# Patient Record
Sex: Female | Born: 1975 | Race: Black or African American | Hispanic: No | Smoking: Current some day smoker
Health system: Southern US, Community
[De-identification: ages and names within clinical notes are randomized; demographics above are authoritative.]

## PROBLEM LIST (undated history)

## (undated) DIAGNOSIS — K8689 Other specified diseases of pancreas: Secondary | ICD-10-CM

## (undated) DIAGNOSIS — B009 Herpesviral infection, unspecified: Secondary | ICD-10-CM

## (undated) DIAGNOSIS — K219 Gastro-esophageal reflux disease without esophagitis: Secondary | ICD-10-CM

## (undated) DIAGNOSIS — E119 Type 2 diabetes mellitus without complications: Secondary | ICD-10-CM

## (undated) DIAGNOSIS — J45909 Unspecified asthma, uncomplicated: Secondary | ICD-10-CM

## (undated) DIAGNOSIS — D649 Anemia, unspecified: Secondary | ICD-10-CM

## (undated) HISTORY — PX: OTHER SURGICAL HISTORY: SHX169

## (undated) HISTORY — PX: LEEP: SHX91

## (undated) HISTORY — DX: Herpesviral infection, unspecified: B00.9

## (undated) HISTORY — DX: Other specified diseases of pancreas: K86.89

## (undated) HISTORY — PX: CERVICAL BIOPSY  W/ LOOP ELECTRODE EXCISION: SUR135

---

## 2006-03-21 HISTORY — PX: WHIPPLE PROCEDURE: SHX2667

## 2010-03-21 HISTORY — PX: DILATION AND CURETTAGE OF UTERUS: SHX78

## 2013-02-17 DIAGNOSIS — Z794 Long term (current) use of insulin: Secondary | ICD-10-CM | POA: Insufficient documentation

## 2013-02-17 DIAGNOSIS — Z881 Allergy status to other antibiotic agents status: Secondary | ICD-10-CM | POA: Insufficient documentation

## 2013-02-17 DIAGNOSIS — R631 Polydipsia: Secondary | ICD-10-CM | POA: Insufficient documentation

## 2013-02-17 DIAGNOSIS — R630 Anorexia: Secondary | ICD-10-CM | POA: Insufficient documentation

## 2013-02-17 DIAGNOSIS — Z79899 Other long term (current) drug therapy: Secondary | ICD-10-CM | POA: Insufficient documentation

## 2013-02-17 DIAGNOSIS — Z3202 Encounter for pregnancy test, result negative: Secondary | ICD-10-CM | POA: Insufficient documentation

## 2013-02-17 DIAGNOSIS — R632 Polyphagia: Secondary | ICD-10-CM | POA: Insufficient documentation

## 2013-02-17 DIAGNOSIS — E119 Type 2 diabetes mellitus without complications: Secondary | ICD-10-CM | POA: Insufficient documentation

## 2013-02-17 DIAGNOSIS — J45909 Unspecified asthma, uncomplicated: Secondary | ICD-10-CM | POA: Insufficient documentation

## 2013-02-17 DIAGNOSIS — R300 Dysuria: Secondary | ICD-10-CM | POA: Insufficient documentation

## 2013-02-17 DIAGNOSIS — R350 Frequency of micturition: Secondary | ICD-10-CM | POA: Insufficient documentation

## 2013-02-17 DIAGNOSIS — M6281 Muscle weakness (generalized): Secondary | ICD-10-CM | POA: Insufficient documentation

## 2013-02-17 DIAGNOSIS — Z882 Allergy status to sulfonamides status: Secondary | ICD-10-CM | POA: Insufficient documentation

## 2013-02-17 DIAGNOSIS — M62838 Other muscle spasm: Secondary | ICD-10-CM | POA: Insufficient documentation

## 2013-02-17 NOTE — ED Notes (Addendum)
C/o high BS. C/o intermittent blurred vision, loss of appettite, weakness, HA, cramps in legs and feet, frequent urination, yeast infection and wt loss. "feel dehydrated". (denies: vomiting or diarrhea), onset ~ 1 month ago. Does not check her BS. Just moved here. Waiting for insurance to start. Won't be able to go to the MD until January. Mentions subjective fever b/c she was sweating really bad and mentions constipation.

## 2013-02-18 ENCOUNTER — Emergency Department (HOSPITAL_COMMUNITY)
Admission: EM | Admit: 2013-02-18 | Discharge: 2013-02-18 | Disposition: A | Discharge status: Home / Self Care | Payer: BC Managed Care – PPO | Attending: Emergency Medicine | Admitting: Emergency Medicine

## 2013-02-18 ENCOUNTER — Encounter (HOSPITAL_COMMUNITY): Payer: Self-pay | Admitting: Emergency Medicine

## 2013-02-18 DIAGNOSIS — R739 Hyperglycemia, unspecified: Secondary | ICD-10-CM

## 2013-02-18 DIAGNOSIS — E119 Type 2 diabetes mellitus without complications: Secondary | ICD-10-CM

## 2013-02-18 HISTORY — DX: Type 2 diabetes mellitus without complications: E11.9

## 2013-02-18 HISTORY — DX: Unspecified asthma, uncomplicated: J45.909

## 2013-02-18 LAB — CBC WITH DIFFERENTIAL/PLATELET
Basophils Relative: 0 % (ref 0–1)
Eosinophils Absolute: 0.1 10*3/uL (ref 0.0–0.7)
Hemoglobin: 14.9 g/dL (ref 12.0–15.0)
MCH: 28.1 pg (ref 26.0–34.0)
MCHC: 35.1 g/dL (ref 30.0–36.0)
MCV: 80 fL (ref 78.0–100.0)
Monocytes Relative: 5 % (ref 3–12)
Neutrophils Relative %: 69 % (ref 43–77)
Platelets: 208 10*3/uL (ref 150–400)

## 2013-02-18 LAB — POCT I-STAT, CHEM 8
BUN: 14 mg/dL (ref 6–23)
Calcium, Ion: 1.23 mmol/L (ref 1.12–1.23)
Chloride: 98 mEq/L (ref 96–112)
Potassium: 3.9 mEq/L (ref 3.5–5.1)
TCO2: 18 mmol/L (ref 0–100)

## 2013-02-18 LAB — GLUCOSE, CAPILLARY
Glucose-Capillary: 405 mg/dL — ABNORMAL HIGH (ref 70–99)
Glucose-Capillary: 199 mg/dL — ABNORMAL HIGH (ref 70–99)
Glucose-Capillary: 224 mg/dL — ABNORMAL HIGH (ref 70–99)

## 2013-02-18 LAB — POCT I-STAT 3, VENOUS BLOOD GAS (G3P V)
O2 Saturation: 91 %
pCO2, Ven: 31.7 mmHg — ABNORMAL LOW (ref 45.0–50.0)
pH, Ven: 7.315 — ABNORMAL HIGH (ref 7.250–7.300)
pO2, Ven: 66 mmHg — ABNORMAL HIGH (ref 30.0–45.0)

## 2013-02-18 LAB — URINE MICROSCOPIC-ADD ON

## 2013-02-18 LAB — URINALYSIS, ROUTINE W REFLEX MICROSCOPIC
Hgb urine dipstick: NEGATIVE
Protein, ur: NEGATIVE mg/dL
Specific Gravity, Urine: 1.039 — ABNORMAL HIGH (ref 1.005–1.030)
Urobilinogen, UA: 0.2 mg/dL (ref 0.0–1.0)

## 2013-02-18 LAB — POCT PREGNANCY, URINE: Preg Test, Ur: NEGATIVE

## 2013-02-18 MED ORDER — INSULIN ASPART 100 UNIT/ML ~~LOC~~ SOLN
10.0000 [IU] | Freq: Once | SUBCUTANEOUS | Status: AC
  Administered 2013-02-18: 10 [IU] via INTRAVENOUS
  Filled 2013-02-18: qty 1

## 2013-02-18 MED ORDER — SODIUM CHLORIDE 0.9 % IV BOLUS (SEPSIS)
1000.0000 mL | Freq: Once | INTRAVENOUS | Status: AC
  Administered 2013-02-18: 1000 mL via INTRAVENOUS

## 2013-02-18 MED ORDER — METFORMIN HCL 500 MG PO TABS: 500.0000 mg | ORAL_TABLET | Freq: Two times a day (BID) | ORAL | Status: DC

## 2013-02-18 MED ORDER — INSULIN ASPART 100 UNIT/ML ~~LOC~~ SOLN: 9.0000 [IU] | Freq: Three times a day (TID) | SUBCUTANEOUS | Status: DC

## 2013-02-18 NOTE — ED Notes (Signed)
CBG 199 

## 2013-02-18 NOTE — ED Notes (Signed)
IV team at bedside 

## 2013-02-18 NOTE — ED Provider Notes (Signed)
CSN: [id number]     Arrival date & time 02/17/13  2339 History   First MD Initiated Contact with Patient 02/18/13 0106     Chief Complaint  Patient presents with  . Hyperglycemia   HPI  History provided by the patient. Patient is a 37 year old female with past history is of diabetes and asthma who recently moved to the [place] area in [place] and presents with concerns for elevated blood sugar and lack of her normal insulin medicine. Patient states that she has been without her small insulin for the past week. She has been waiting for her new medical insurance to start in January and has not followed up with a primary care provider. She states she does have some insulin remaining but ran out of her syringe is. Since then she has had some feeling of dehydration with occasional cramping in the feet, gradual general weakness and decreasing appetite. She also complains of polyuria and polydipsia slight dysuria. She denies any hematuria.    Past Medical History  Diagnosis Date  . Diabetes mellitus without complication   . Asthma    Past Surgical History  Procedure Laterality Date  . Cervical biopsy  w/ loop electrode excision     No family history on file. History  Substance Use Topics  . Smoking status: Never Smoker   . Smokeless tobacco: Not on file  . Alcohol Use: Yes     Comment: occaisionally   OB History   Grav Para Term Preterm Abortions TAB SAB Ect Mult Living                 Review of Systems  Constitutional: Negative for fever, chills and diaphoresis.  Respiratory: Negative for shortness of breath.   Cardiovascular: Negative for chest pain.  Gastrointestinal: Negative for nausea, vomiting, abdominal pain, diarrhea and constipation.  Endocrine: Positive for polyphagia and polyuria.  Genitourinary: Positive for dysuria. Negative for hematuria, flank pain, vaginal bleeding and vaginal discharge.  All other systems reviewed and are negative.    Allergies   Amoxicillin and Sulfa antibiotics  Home Medications  No current outpatient prescriptions on file. BP 124/85  Pulse 90  Temp(Src) 98.8 F (37.1 C) (Oral)  Resp 18  SpO2 99%  LMP 01/26/2013 Physical Exam  Nursing note and vitals reviewed. Constitutional: She is oriented to person, place, and time. She appears well-developed and well-nourished. No distress.  HENT:  Head: Normocephalic and atraumatic.  Eyes: Conjunctivae and EOM are normal. Pupils are equal, round, and reactive to light.  Cardiovascular: Normal rate and regular rhythm.   Pulmonary/Chest: Effort normal and breath sounds normal. No respiratory distress. She has no wheezes. She has no rales.  Abdominal: Soft. There is no tenderness. There is no rebound and no guarding.  No CVA tenderness  Neurological: She is alert and oriented to person, place, and time.  Skin: Skin is warm and dry. No rash noted.  Psychiatric: She has a normal mood and affect. Her behavior is normal.    ED Course  Procedures   DIAGNOSTIC STUDIES: Oxygen Saturation is 99% on room air.    COORDINATION OF CARE:  Nursing notes reviewed. Vital signs reviewed. Initial pt interview and examination performed.   1:20 PM patient seen and evaluated. Patient well-appearing in no acute distress. No clinical signs concerning for DKA. No recent complaints of illness or infection. Patient has been unable to use her normal insulin doses. Discussed work up plan with pt at bedside, which includes lab testing and UA. Pt  agrees with plan.  No signs for DKA. Blood sugar has improved significantly. Patient is feeling slightly better with IV fluids. At this time we'll give prescriptions for her insulin was and metformin. Patient instructed to followup with PCP for continued evaluation and treatment.  Treatment plan initiated: Medications  sodium chloride 0.9 % bolus 1,000 mL (not administered)  insulin aspart (novoLOG) injection 10 Units (not administered)     Results for orders placed during the hospital encounter of 02/18/13  CBC WITH DIFFERENTIAL      Result Value Range   WBC 12.0 (*) 4.0 - 10.5 K/uL   RBC 5.30 (*) 3.87 - 5.11 MIL/uL   Hemoglobin 14.9  12.0 - 15.0 g/dL   HCT 47.8  29.5 - 62.1 %   MCV 80.0  78.0 - 100.0 fL   MCH 28.1  26.0 - 34.0 pg   MCHC 35.1  30.0 - 36.0 g/dL   RDW 30.8  65.7 - 84.6 %   Platelets 208  150 - 400 K/uL   Neutrophils Relative % 69  43 - 77 %   Neutro Abs 8.3 (*) 1.7 - 7.7 K/uL   Lymphocytes Relative 25  12 - 46 %   Lymphs Abs 3.0  0.7 - 4.0 K/uL   Monocytes Relative 5  3 - 12 %   Monocytes Absolute 0.6  0.1 - 1.0 K/uL   Eosinophils Relative 1  0 - 5 %   Eosinophils Absolute 0.1  0.0 - 0.7 K/uL   Basophils Relative 0  0 - 1 %   Basophils Absolute 0.0  0.0 - 0.1 K/uL  URINALYSIS, ROUTINE W REFLEX MICROSCOPIC      Result Value Range   Color, Urine YELLOW  YELLOW   APPearance CLEAR  CLEAR   Specific Gravity, Urine 1.039 (*) 1.005 - 1.030   pH 6.0  5.0 - 8.0   Glucose, UA >1000 (*) NEGATIVE mg/dL   Hgb urine dipstick NEGATIVE  NEGATIVE   Bilirubin Urine NEGATIVE  NEGATIVE   Ketones, ur >80 (*) NEGATIVE mg/dL   Protein, ur NEGATIVE  NEGATIVE mg/dL   Urobilinogen, UA 0.2  0.0 - 1.0 mg/dL   Nitrite NEGATIVE  NEGATIVE   Leukocytes, UA NEGATIVE  NEGATIVE  URINE MICROSCOPIC-ADD ON      Result Value Range   Squamous Epithelial / LPF RARE  RARE   WBC, UA 0-2  <3 WBC/hpf   RBC / HPF 0-2  <3 RBC/hpf   Urine-Other RARE YEAST    GLUCOSE, CAPILLARY      Result Value Range   Glucose-Capillary 405 (*) 70 - 99 mg/dL  GLUCOSE, CAPILLARY      Result Value Range   Glucose-Capillary 199 (*) 70 - 99 mg/dL  GLUCOSE, CAPILLARY      Result Value Range   Glucose-Capillary 224 (*) 70 - 99 mg/dL  POCT PREGNANCY, URINE      Result Value Range   Preg Test, Ur NEGATIVE  NEGATIVE  POCT I-STAT, CHEM 8      Result Value Range   Sodium 132 (*) 135 - 145 mEq/L   Potassium 3.9  3.5 - 5.1 mEq/L   Chloride 98  96 -  112 mEq/L   BUN 14  6 - 23 mg/dL   Creatinine, Ser 9.62  0.50 - 1.10 mg/dL   Glucose, Bld 952 (*) 70 - 99 mg/dL   Calcium, Ion 8.41  3.24 - 1.23 mmol/L   TCO2 18  0 - 100 mmol/L   Hemoglobin  16.7 (*) 12.0 - 15.0 g/dL   HCT 16.1 (*) 09.6 - 04.5 %  POCT I-STAT 3, BLOOD GAS (G3P V)      Result Value Range   pH, Ven 7.315 (*) 7.250 - 7.300   pCO2, Ven 31.7 (*) 45.0 - 50.0 mmHg   pO2, Ven 66.0 (*) 30.0 - 45.0 mmHg   Bicarbonate 16.1 (*) 20.0 - 24.0 mEq/L   TCO2 17  0 - 100 mmol/L   O2 Saturation 91.0     Acid-base deficit 9.0 (*) 0.0 - 2.0 mmol/L   Patient temperature 98.8 F     Sample type VENOUS         MDM   1. Hyperglycemia   2. Diabetes        Angus Seller, PA-C 02/18/13 [phone]

## 2013-02-18 NOTE — ED Notes (Signed)
CBG taken =405

## 2013-02-18 NOTE — ED Notes (Signed)
Pt reports she recently moved to [place] from [place], pt states she was dx with diabetes in 2008, pt states she usually manages her blood glucose well. However the pt has not taken her prescribed insulin in over a month as she lost her insurance and dose not have a MD here in [place]. Pt states she feels hot and cold, frequent urination, and that she feels foggy.

## 2013-02-18 NOTE — ED Provider Notes (Signed)
Medical screening examination/treatment/procedure(s) were performed by non-physician practitioner and as supervising physician I was immediately available for consultation/collaboration.    Clemma Johnsen M Kristapher [place], MD 02/18/13 0634 

## 2013-02-18 NOTE — ED Notes (Signed)
IV team called and paged. 3 IV attempts by 2 RNs.

## 2013-03-15 ENCOUNTER — Emergency Department (HOSPITAL_COMMUNITY)
Admission: EM | Admit: 2013-03-15 | Discharge: 2013-03-15 | Disposition: A | Discharge status: Home / Self Care | Payer: BC Managed Care – PPO | Source: Home / Self Care | Attending: Family Medicine | Admitting: Family Medicine

## 2013-03-15 ENCOUNTER — Encounter (HOSPITAL_COMMUNITY): Payer: Self-pay | Admitting: Emergency Medicine

## 2013-03-15 DIAGNOSIS — J040 Acute laryngitis: Secondary | ICD-10-CM

## 2013-03-15 MED ORDER — METHYLPREDNISOLONE SODIUM SUCC 125 MG IJ SOLR
125.0000 mg | Freq: Once | INTRAMUSCULAR | Status: AC
  Administered 2013-03-15: 125 mg via INTRAMUSCULAR

## 2013-03-15 MED ORDER — METHYLPREDNISOLONE SODIUM SUCC 125 MG IJ SOLR
INTRAMUSCULAR | Status: AC
  Filled 2013-03-15: qty 2

## 2013-03-15 MED ORDER — METHYLPREDNISOLONE 4 MG PO KIT: PACK | ORAL | Status: DC

## 2013-03-15 NOTE — ED Notes (Signed)
Patient not ready for discharge-medication ordered

## 2013-03-15 NOTE — ED Notes (Signed)
No voice, cough, sinus drainage.  Denies sore throat, denies fever.  Onset of symptoms 2 weeks ago

## 2013-03-15 NOTE — ED Notes (Signed)
Bed: UC07 Expected date:  Expected time:  Means of arrival:  Comments: NO PROVIDER

## 2013-03-15 NOTE — ED Notes (Signed)
Patient denies pain and is resting comfortably.  

## 2013-03-15 NOTE — ED Provider Notes (Signed)
CSN: [id number]     Arrival date & time 03/15/13  4098 History   First MD Initiated Contact with Patient 03/15/13 [phone]     No chief complaint on file.  (Consider location/radiation/quality/duration/timing/severity/associated sxs/prior Treatment) Patient is a 37 y.o. female presenting with URI. The history is provided by the patient.  URI Presenting symptoms: congestion   Presenting symptoms: no fever and no rhinorrhea   Presenting symptoms comment:  Laryngitis  Severity:  Mild Onset quality:  Gradual Duration:  2 weeks Progression:  Unchanged Chronicity:  New Relieved by:  None tried Worsened by:  Nothing tried   Past Medical History  Diagnosis Date  . Diabetes mellitus without complication   . Asthma    Past Surgical History  Procedure Laterality Date  . Cervical biopsy  w/ loop electrode excision     No family history on file. History  Substance Use Topics  . Smoking status: Never Smoker   . Smokeless tobacco: Not on file  . Alcohol Use: Yes     Comment: occaisionally   OB History   Grav Para Term Preterm Abortions TAB SAB Ect Mult Living                 Review of Systems  Constitutional: Negative.  Negative for fever.  HENT: Positive for congestion and voice change. Negative for postnasal drip and rhinorrhea.   Respiratory: Negative.     Allergies  Amoxicillin and Sulfa antibiotics  Home Medications   Current Outpatient Rx  Name  Route  Sig  Dispense  Refill  . insulin aspart (NOVOLOG) 100 UNIT/ML injection   Subcutaneous   Inject 9 Units into the skin 3 (three) times daily before meals.   1 vial   12   . metFORMIN (GLUCOPHAGE) 500 MG tablet   Oral   Take 1 tablet (500 mg total) by mouth 2 (two) times daily.   30 tablet   0   . methylPREDNISolone (MEDROL DOSEPAK) 4 MG tablet      follow package directions, start on sat, take until finished   21 tablet   0   . Prenatal Vit-Fe Fumarate-FA (PRENATAL MULTIVITAMIN) TABS tablet   Oral   Take 1  tablet by mouth daily at 12 noon.         . vitamin C (ASCORBIC ACID) 500 MG tablet   Oral   Take 500 mg by mouth 2 (two) times daily.          BP 105/75  Pulse 72  Temp(Src) 97.5 F (36.4 C) (Oral)  Resp 20  SpO2 100%  LMP 02/24/2013 Physical Exam  Nursing note and vitals reviewed. Constitutional: She is oriented to person, place, and time. She appears well-developed and well-nourished.  HENT:  Head: Normocephalic.  Right Ear: External ear normal.  Left Ear: External ear normal.  Mouth/Throat: Oropharynx is clear and moist.  laryngitis  Eyes: Conjunctivae are normal.  Neck: Normal range of motion. Neck supple.  Pulmonary/Chest: Breath sounds normal.  Lymphadenopathy:    She has no cervical adenopathy.  Neurological: She is alert and oriented to person, place, and time.  Skin: Skin is warm and dry.    ED Course  Procedures (including critical care time) Labs Review Labs Reviewed - No data to display Imaging Review No results found.  EKG Interpretation    Date/Time:    Ventricular Rate:    PR Interval:    QRS Duration:   QT Interval:    QTC Calculation:   R  Axis:     Text Interpretation:              MDM   1. Laryngitis, acute      Linna Hoff, MD 03/15/13 [phone]

## 2013-03-18 ENCOUNTER — Emergency Department (INDEPENDENT_AMBULATORY_CARE_PROVIDER_SITE_OTHER): Payer: BC Managed Care – PPO

## 2013-03-18 ENCOUNTER — Emergency Department (HOSPITAL_COMMUNITY)
Admission: EM | Admit: 2013-03-18 | Discharge: 2013-03-18 | Disposition: A | Discharge status: Home / Self Care | Payer: BC Managed Care – PPO | Source: Home / Self Care | Attending: Emergency Medicine | Admitting: Emergency Medicine

## 2013-03-18 ENCOUNTER — Encounter (HOSPITAL_COMMUNITY): Payer: Self-pay | Admitting: Emergency Medicine

## 2013-03-18 DIAGNOSIS — J040 Acute laryngitis: Secondary | ICD-10-CM

## 2013-03-18 LAB — POCT RAPID STREP A: Streptococcus, Group A Screen (Direct): NEGATIVE

## 2013-03-18 NOTE — ED Notes (Signed)
Follow up on laryngitis States she doesn't feel any better

## 2013-03-18 NOTE — ED Provider Notes (Signed)
CSN: [id number]     Arrival date & time 03/18/13  1429 History   First MD Initiated Contact with Patient 03/18/13 1822     Chief Complaint  Patient presents with  . Laryngitis   (Consider location/radiation/quality/duration/timing/severity/associated sxs/prior Treatment) HPI Comments: 37 year old female presents complaining of laryngitis. This originally began 3 weeks ago with laryngitis is the only symptom, starting suddenly and getting worse over days. She also started to develop a cough around this time, after laryngitis. The cough has persisted and remains nonproductive. Additionally, she admits to unintended weight loss in the past 3 weeks, not measured but feeling her clothes loosen, as well as night sweats in the past 2 weeks. She was previously seen here for this problem a few days ago. She was given a shot of Solu-Medrol and a Medrol Dosepak at that time. In the 3 days since that time, she says this has gotten progressively worse and she started to have a slight sore throat. She does not smoke. No shortness of breath or hemoptysis. No recent travel   Past Medical History  Diagnosis Date  . Diabetes mellitus without complication   . Asthma    Past Surgical History  Procedure Laterality Date  . Cervical biopsy  w/ loop electrode excision     History reviewed. No pertinent family history. History  Substance Use Topics  . Smoking status: Never Smoker   . Smokeless tobacco: Not on file  . Alcohol Use: Yes     Comment: occaisionally   OB History   Grav Para Term Preterm Abortions TAB SAB Ect Mult Living                 Review of Systems  Constitutional: Positive for diaphoresis (at night only), appetite change and unexpected weight change. Negative for fever and chills.  HENT: Positive for sore throat and voice change.   Eyes: Negative for visual disturbance.  Respiratory: Positive for cough. Negative for shortness of breath and wheezing.   Cardiovascular: Negative for chest  pain, palpitations and leg swelling.  Gastrointestinal: Negative for nausea, vomiting and abdominal pain.  Endocrine: Negative for polydipsia and polyuria.  Genitourinary: Negative for dysuria, urgency and frequency.  Musculoskeletal: Negative for arthralgias and myalgias.  Skin: Negative for rash.  Neurological: Negative for dizziness, weakness and light-headedness.    Allergies  Amoxicillin and Sulfa antibiotics  Home Medications   Current Outpatient Rx  Name  Route  Sig  Dispense  Refill  . insulin aspart (NOVOLOG) 100 UNIT/ML injection   Subcutaneous   Inject 9 Units into the skin 3 (three) times daily before meals.   1 vial   12   . metFORMIN (GLUCOPHAGE) 500 MG tablet   Oral   Take 1 tablet (500 mg total) by mouth 2 (two) times daily.   30 tablet   0   . methylPREDNISolone (MEDROL DOSEPAK) 4 MG tablet      follow package directions, start on sat, take until finished   21 tablet   0   . Prenatal Vit-Fe Fumarate-FA (PRENATAL MULTIVITAMIN) TABS tablet   Oral   Take 1 tablet by mouth daily at 12 noon.         . vitamin C (ASCORBIC ACID) 500 MG tablet   Oral   Take 500 mg by mouth 2 (two) times daily.          BP 125/76  Pulse 70  Temp(Src) 98.1 F (36.7 C) (Oral)  Resp 14  SpO2 99%  LMP 02/24/2013  Physical Exam  Nursing note and vitals reviewed. Constitutional: She is oriented to person, place, and time. Vital signs are normal. She appears well-developed and well-nourished. No distress.  laryngitis  HENT:  Head: Normocephalic and atraumatic.  Nose: Nose normal.  Mouth/Throat: Uvula is midline, oropharynx is clear and moist and mucous membranes are normal. No oropharyngeal exudate.  Neck: Normal range of motion. Neck supple. No JVD present. No tracheal deviation present.  Cardiovascular: Normal rate, regular rhythm and normal heart sounds.  Exam reveals no gallop and no friction rub.   No murmur heard. Pulmonary/Chest: Effort normal and breath sounds  normal. No respiratory distress. She has no wheezes. She has no rales.  Lymphadenopathy:    She has no cervical adenopathy.       Right: No supraclavicular adenopathy present.       Left: No supraclavicular adenopathy present.  Neurological: She is alert and oriented to person, place, and time. She has normal strength. Coordination normal.  Skin: Skin is warm and dry. No rash noted. She is not diaphoretic.  Psychiatric: She has a normal mood and affect. Judgment normal.    ED Course  Procedures (including critical care time) Labs Review Labs Reviewed  POCT RAPID STREP A ([facility] CARE ONLY)   Imaging Review Dg Chest 2 View  03/18/2013   CLINICAL DATA:  Cough, laryngitis.  EXAM: CHEST  2 VIEW  COMPARISON:  None.  FINDINGS: The heart size and mediastinal contours are within normal limits. There is no focal infiltrate, pulmonary edema, or pleural effusion. There is scoliosis of spine.  IMPRESSION: No active cardiopulmonary disease.   Electronically Signed   By: Sherian Rein M.D.   On: 03/18/2013 18:55      MDM   1. Laryngitis    CXR negative for any masses.  Discussed with patient that the prolonged laryngitis along with the night sweats and weight loss is concerning for possible malignancy, she will need to see otolaryngology as soon as possible. She has been referred to Dr. Annalee Genta, she will call tomorrow morning for an appointment.   Graylon Good, PA-C 03/18/13 1922

## 2013-03-19 NOTE — ED Provider Notes (Signed)
Medical screening examination/treatment/procedure(s) were performed by non-physician practitioner and as supervising physician I was immediately available for consultation/collaboration.  Kashmere Daywalt, M.D.  Vicenta Olds C Mystery Schrupp, MD 03/19/13 0022 

## 2013-03-20 LAB — CULTURE, GROUP A STREP

## 2013-03-21 DIAGNOSIS — K8689 Other specified diseases of pancreas: Secondary | ICD-10-CM

## 2013-03-21 HISTORY — DX: Other specified diseases of pancreas: K86.89

## 2013-04-08 NOTE — Progress Notes (Signed)
ED CM received incoming call from patient regarding needing a prior authorization for a prescription that she claims she got here 02/18/13. Pt was informed that she would need to see PCP to get them to write her a prescription. No further ED CM needs identified.

## 2013-04-11 ENCOUNTER — Ambulatory Visit: Payer: BC Managed Care – PPO | Admitting: Obstetrics & Gynecology

## 2013-04-11 ENCOUNTER — Ambulatory Visit (INDEPENDENT_AMBULATORY_CARE_PROVIDER_SITE_OTHER): Payer: BC Managed Care – PPO | Admitting: Obstetrics & Gynecology

## 2013-04-11 ENCOUNTER — Encounter: Payer: Self-pay | Admitting: Obstetrics & Gynecology

## 2013-04-11 ENCOUNTER — Emergency Department (HOSPITAL_COMMUNITY)
Admission: EM | Admit: 2013-04-11 | Discharge: 2013-04-11 | Disposition: A | Discharge status: Home / Self Care | Payer: BC Managed Care – PPO | Source: Home / Self Care | Attending: Emergency Medicine | Admitting: Emergency Medicine

## 2013-04-11 ENCOUNTER — Encounter (HOSPITAL_COMMUNITY): Payer: Self-pay | Admitting: Emergency Medicine

## 2013-04-11 VITALS — BP 124/79 | HR 96 | Temp 98.0°F | Ht 71.0 in | Wt 176.0 lb

## 2013-04-11 DIAGNOSIS — B373 Candidiasis of vulva and vagina: Secondary | ICD-10-CM

## 2013-04-11 DIAGNOSIS — Z01419 Encounter for gynecological examination (general) (routine) without abnormal findings: Secondary | ICD-10-CM

## 2013-04-11 DIAGNOSIS — B3731 Acute candidiasis of vulva and vagina: Secondary | ICD-10-CM

## 2013-04-11 DIAGNOSIS — E109 Type 1 diabetes mellitus without complications: Secondary | ICD-10-CM

## 2013-04-11 DIAGNOSIS — Z0000 Encounter for general adult medical examination without abnormal findings: Secondary | ICD-10-CM

## 2013-04-11 LAB — POCT I-STAT, CHEM 8
BUN: 6 mg/dL (ref 6–23)
CREATININE: 0.7 mg/dL (ref 0.50–1.10)
Calcium, Ion: 1.22 mmol/L (ref 1.12–1.23)
Chloride: 97 mEq/L (ref 96–112)
Glucose, Bld: 335 mg/dL — ABNORMAL HIGH (ref 70–99)
HCT: 49 % — ABNORMAL HIGH (ref 36.0–46.0)
Hemoglobin: 16.7 g/dL — ABNORMAL HIGH (ref 12.0–15.0)
POTASSIUM: 4.3 meq/L (ref 3.7–5.3)
Sodium: 136 mEq/L — ABNORMAL LOW (ref 137–147)
TCO2: 26 mmol/L (ref 0–100)

## 2013-04-11 LAB — POCT WET PREP (WET MOUNT): Clue Cells Wet Prep Whiff POC: NEGATIVE

## 2013-04-11 MED ORDER — TERCONAZOLE 0.4 % VA CREA: 1.0000 | TOPICAL_CREAM | Freq: Every day | VAGINAL | Status: DC

## 2013-04-11 MED ORDER — PANCRELIPASE (LIP-PROT-AMYL) 16800-56800 UNITS PO CPEP: ORAL_CAPSULE | ORAL | Status: DC

## 2013-04-11 MED ORDER — INSULIN DETEMIR 100 UNIT/ML ~~LOC~~ SOLN: SUBCUTANEOUS | Status: DC

## 2013-04-11 MED ORDER — INSULIN ASPART 100 UNIT/ML ~~LOC~~ SOLN: SUBCUTANEOUS | Status: DC

## 2013-04-11 MED ORDER — ALBUTEROL SULFATE HFA 108 (90 BASE) MCG/ACT IN AERS: 2.0000 | INHALATION_SPRAY | RESPIRATORY_TRACT | Status: DC | PRN

## 2013-04-11 MED ORDER — INSULIN SYRINGES (DISPOSABLE) U-100 1 ML MISC: Status: DC

## 2013-04-11 NOTE — Progress Notes (Signed)
Subjective:     Kristina  Angelique Neal is a 38 y.o. female here for a routine exam.  Current complaints: Here for annual exam. Patient states she also has a really bad yeast infection  Personal health questionnaire reviewed: yes.   Gynecologic History Patient's last menstrual period was 02/24/2013. Contraception: abstinence Last Pap: 03/2012. Results were: normal  Obstetric History OB History  No data available     The following portions of the patient's history were reviewed and updated as appropriate: allergies, current medications, past family history, past medical history, past social history, past surgical history and problem list.  Review of Systems Pertinent items are noted in HPI.    Objective:      General appearance: alert Breasts: normal appearance, no masses or tenderness Abdomen: soft, non-tender; bowel sounds normal; no masses,  no organomegaly Pelvic: cervix normal in appearance, external genitalia normal with diffuse erythema, no adnexal masses or tenderness, uterus normal size, shape, and consistency and vagina normal with scant, white discharge       Assessment:    Complicated candidal vulvovaginitis  Plan:  Terazol x 2 weeks  If treatment is unsuccessful or symptoms persist, follow up in office.

## 2013-04-11 NOTE — ED Notes (Signed)
C/o dm States she has been out of medication for a month now States she has not been able to check sugar

## 2013-04-11 NOTE — ED Provider Notes (Signed)
Chief Complaint   Chief Complaint  Patient presents with  . 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 She's been on NovoLog insulin 9 units before each meal and Levemir insulin 7 units twice a day. With this her blood sugar was under good control. She ran out of her medications about a month ago. She does not have a primary care doctor or endocrinologist yet to whom she can turn for refills. Since she's run out off her insulin she has lost weight and had polyuria and polydipsia. She denies any abdominal pain, nausea, or vomiting. She has not been checking her blood sugars. She denies any eye problems, shortness of breath, chest pain, or extremity pain, paresthesia, swelling, or ulcerations. She also has a history of asthma and has had an albuterol inhaler which she would like to get refilled as well. 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 She would like a refill on this as well.  Review of Systems   Other than as noted above, the patient denies any of the following symptoms. Systemic:  No fever, chills, fatigue, weight loss or gain. Eye:  No blurred vision or diplopia. Lungs:  No cough, wheezing, or shortness of breath. Heart:  No chest pain, tightness, pressure, palpitation, dizziness, syncope, or edema. Abdomen:  No abdominal pain, nausea, vomiting or diarrrhea. GU:  No dysuria, frequency, urgency, hematuria. Ext:  No pain, paresthesias, swelling, or ulcerations. Endocrine:  No polyuria, polydipsia, heat or cold intolerance. Skin:  No rash or itching. Neuro:  No focal weakness or numbness.  [place]   Past medical history, family history, social history, meds, and allergies were reviewed.    Physical Examination    Vital signs:  BP 117/80  Pulse 86  Temp(Src) 98.1 F (36.7 C) (Oral)  Resp 16  SpO2 98%  LMP 02/24/2013 Gen:  Alert, oriented, in no distress. Eye:  PERRL, full EOM, lids conjunctivas, and sclera unremarkable. ENT:  TMs and canals normal.  Mucous membranes moist.  No acetone odor.  Pharynx clear.   Neck:  Supple, full ROM, no adenopathy or tenderness.  No JVD. Lungs:  Clear to auscultation.  No wheezes, rales or rhonchi. Heart:  Regular rhythm.  No gallops or murmers. Abdomen:  Soft, flat, non-distended, nontener.  No hepato-splenomegaly or mass.  Bowel sounds normal.  No pulsatile midline mass or bruit. Ext:  No edema, pulses full.  No ulceration or skin lesions. Skin:  Clear, warm and dry.  No rash or lesions. Neuro:  Alert and oriented times 3.  No focal weakness.  Speech normal.  CNs intact.  Labs   Results for orders placed during the hospital encounter of 04/11/13  POCT I-STAT, CHEM 8      Result Value Range   Sodium 136 (*) 137 - 147 mEq/L   Potassium 4.3  3.7 - 5.3 mEq/L   Chloride 97  96 - 112 mEq/L   BUN 6  6 - 23 mg/dL   Creatinine, Ser 0.70  0.50 - 1.10 mg/dL   Glucose, Bld 335 (*) 70 - 99 mg/dL   Calcium, Ion 1.22  1.12 - 1.23 mmol/L   TCO2 26  0 - 100 mmol/L  Hemoglobin 16.7 (*) 12.0 - 15.0 g/dL   HCT 49.0 (*) 36.0 - 46.0 %    Assessment   The encounter diagnosis was Type 1 diabetes mellitus.  She also has asthma and malabsorption syndrome. She was provided with refills of medications she needs and she was urged to followup with her primary care physician as soon as possible.  Plan     1.  Meds:  The following meds were prescribed:   New Prescriptions   ALBUTEROL (PROVENTIL HFA;VENTOLIN HFA) 108 (90 BASE) MCG/ACT INHALER    Inhale 2 puffs into the lungs every 4 (four) hours as needed for wheezing or shortness of breath.   INSULIN ASPART (NOVOLOG) 100 UNIT/ML INJECTION    9 units before each meal   INSULIN DETEMIR (LEVEMIR) 100  UNIT/ML INJECTION    Take 7 units BID   INSULIN SYRINGES, DISPOSABLE, U-100 1 ML MISC    Use as needed for insulin injection.   PANCRELIPASE, LIP-PROT-AMYL, (PANCREAZE) 09983 UNITS CPEP    Take 1 before each meal    2.  Patient Education/Counseling:  The patient was given appropriate handouts, self care instructions, and instructed in symptomatic relief.   3.  Follow up:  The patient was told to follow up here if no better in 3 to 4 days, or sooner if becoming worse in any way, and given some red flag symptoms such as fever, persistent vomiting, abdominal pain, chest pain, or shortness of breath which would prompt immediate return.  Follow up with Dr. Mable Paris within the next month.      Harden Mo, MD 04/11/13 1120

## 2013-04-11 NOTE — Patient Instructions (Signed)
Candidal Vulvovaginitis Candidal vulvovaginitis is an infection of the vagina and vulva. The vulva is the skin around the opening of the vagina. This may cause itching and discomfort in and around the vagina.  HOME CARE  Only take medicine as told by your doctor.  Do not have sex (intercourse) until the infection is healed or as told by your doctor.  Practice safe sex.  Tell your sex partner about your infection.  Do not douche or use tampons.  Wear cotton underwear. Do not wear tight pants or panty hose.  Eat yogurt. This may help treat and prevent yeast infections. GET HELP RIGHT AWAY IF:   You have a fever.  Your problems get worse during treatment or do not get better in 3 days.  You have discomfort, irritation, or itching in your vagina or vulva area.  You have pain after sex.  You start to get belly (abdominal) pain. MAKE SURE YOU:  Understand these instructions.  Will watch your condition.  Will get help right away if you are not doing well or get worse. Document Released: 06/03/2008 Document Revised: 05/30/2011 Document Reviewed: 06/03/2008 [facility] Patient Information 2014 [place], [place]. Health Maintenance, Female A healthy lifestyle and preventative care can promote health and wellness.  Maintain regular health, dental, and eye exams.  Eat a healthy diet. Foods like vegetables, fruits, whole grains, low-fat dairy products, and lean protein foods contain the nutrients you need without too many calories. Decrease your intake of foods high in solid fats, added sugars, and salt. Get information about a proper diet from your caregiver, if necessary.  Regular physical exercise is one of the most important things you can do for your health. Most adults should get at least 150 minutes of moderate-intensity exercise (any activity that increases your heart rate and causes you to sweat) each week. In addition, most adults need muscle-strengthening exercises on 2 or more  days a week.   Maintain a healthy weight. The body mass index (BMI) is a screening tool to identify possible weight problems. It provides an estimate of body fat based on height and weight. Your caregiver can help determine your BMI, and can help you achieve or maintain a healthy weight. For adults 20 years and older:  A BMI below 18.5 is considered underweight.  A BMI of 18.5 to 24.9 is normal.  A BMI of 25 to 29.9 is considered overweight.  A BMI of 30 and above is considered obese.  Maintain normal blood lipids and cholesterol by exercising and minimizing your intake of saturated fat. Eat a balanced diet with plenty of fruits and vegetables. Blood tests for lipids and cholesterol should begin at age 99 and be repeated every 5 years. If your lipid or cholesterol levels are high, you are over 50, or you are a high risk for heart disease, you may need your cholesterol levels checked more frequently.Ongoing high lipid and cholesterol levels should be treated with medicines if diet and exercise are not effective.  If you smoke, find out from your caregiver how to quit. If you do not use tobacco, do not start.  Lung cancer screening is recommended for adults aged 33 80 years who are at high risk for developing lung cancer because of a history of smoking. Yearly low-dose computed tomography (CT) is recommended for people who have at least a 30-pack-year history of smoking and are a current smoker or have quit within the past 15 years. A pack year of smoking is smoking an average  of 1 pack of cigarettes a day for 1 year (for example: 1 pack a day for 30 years or 2 packs a day for 15 years). Yearly screening should continue until the smoker has stopped smoking for at least 15 years. Yearly screening should also be stopped for people who develop a health problem that would prevent them from having lung cancer treatment.  If you are pregnant, do not drink alcohol. If you are breastfeeding, be very  cautious about drinking alcohol. If you are not pregnant and choose to drink alcohol, do not exceed 1 drink per day. One drink is considered to be 12 ounces (355 mL) of beer, 5 ounces (148 mL) of wine, or 1.5 ounces (44 mL) of liquor.  Avoid use of street drugs. Do not share needles with anyone. Ask for help if you need support or instructions about stopping the use of drugs.  High blood pressure causes heart disease and increases the risk of stroke. Blood pressure should be checked at least every 1 to 2 years. Ongoing high blood pressure should be treated with medicines, if weight loss and exercise are not effective.  If you are 80 to 38 years old, ask your caregiver if you should take aspirin to prevent strokes.  Diabetes screening involves taking a blood sample to check your fasting blood sugar level. This should be done once every 3 years, after age 34, if you are within normal weight and without risk factors for diabetes. Testing should be considered at a younger age or be carried out more frequently if you are overweight and have at least 1 risk factor for diabetes.  Breast cancer screening is essential preventative care for women. You should practice "breast self-awareness." This means understanding the normal appearance and feel of your breasts and may include breast self-examination. Any changes detected, no matter how small, should be reported to a caregiver. Women in their 22s and 30s should have a clinical breast exam (CBE) by a caregiver as part of a regular health exam every 1 to 3 years. After age 15, women should have a CBE every year. Starting at age 12, women should consider having a mammogram (breast X-ray) every year. Women who have a family history of breast cancer should talk to their caregiver about genetic screening. Women at a high risk of breast cancer should talk to their caregiver about having an MRI and a mammogram every year.  Breast cancer gene (BRCA)-related cancer risk  assessment is recommended for women who have family members with BRCA-related cancers. BRCA-related cancers include breast, ovarian, tubal, and peritoneal cancers. Having family members with these cancers may be associated with an increased risk for harmful changes (mutations) in the breast cancer genes BRCA1 and BRCA2. Results of the assessment will determine the need for genetic counseling and BRCA1 and BRCA2 testing.  The Pap test is a screening test for cervical cancer. Women should have a Pap test starting at age 52. Between ages 9 and 38, Pap tests should be repeated every 2 years. Beginning at age 38, you should have a Pap test every 3 years as long as the past 3 Pap tests have been normal. If you had a hysterectomy for a problem that was not cancer or a condition that could lead to cancer, then you no longer need Pap tests. If you are between ages 34 and 76, and you have had normal Pap tests going back 10 years, you no longer need Pap tests. If you have had past  treatment for cervical cancer or a condition that could lead to cancer, you need Pap tests and screening for cancer for at least 20 years after your treatment. If Pap tests have been discontinued, risk factors (such as a new sexual partner) need to be reassessed to determine if screening should be resumed. Some women have medical problems that increase the chance of getting cervical cancer. In these cases, your caregiver may recommend more frequent screening and Pap tests.  The human papillomavirus (HPV) test is an additional test that may be used for cervical cancer screening. The HPV test looks for the virus that can cause the cell changes on the cervix. The cells collected during the Pap test can be tested for HPV. The HPV test could be used to screen women aged 34 years and older, and should be used in women of any age who have unclear Pap test results. After the age of 58, women should have HPV testing at the same frequency as a Pap  test.  Colorectal cancer can be detected and often prevented. Most routine colorectal cancer screening begins at the age of 96 and continues through age 81. However, your caregiver may recommend screening at an earlier age if you have risk factors for colon cancer. On a yearly basis, your caregiver may provide home test kits to check for hidden blood in the stool. Use of a small camera at the end of a tube, to directly examine the colon (sigmoidoscopy or colonoscopy), can detect the earliest forms of colorectal cancer. Talk to your caregiver about this at age 16, when routine screening begins. Direct examination of the colon should be repeated every 5 to 10 years through age 31, unless early forms of pre-cancerous polyps or small growths are found.  Hepatitis C blood testing is recommended for all people born from [birth year] through 1965 and any individual with known risks for hepatitis C.  Practice safe sex. Use condoms and avoid high-risk sexual practices to reduce the spread of sexually transmitted infections (STIs). Sexually active women aged 71 and younger should be checked for Chlamydia, which is a common sexually transmitted infection. Older women with new or multiple partners should also be tested for Chlamydia. Testing for other STIs is recommended if you are sexually active and at increased risk.  Osteoporosis is a disease in which the bones lose minerals and strength with aging. This can result in serious bone fractures. The risk of osteoporosis can be identified using a bone density scan. Women ages 43 and over and women at risk for fractures or osteoporosis should discuss screening with their caregivers. Ask your caregiver whether you should be taking a calcium supplement or vitamin D to reduce the rate of osteoporosis.  Menopause can be associated with physical symptoms and risks. Hormone replacement therapy is available to decrease symptoms and risks. You should talk to your caregiver about  whether hormone replacement therapy is right for you.  Use sunscreen. Apply sunscreen liberally and repeatedly throughout the day. You should seek shade when your shadow is shorter than you. Protect yourself by wearing long sleeves, pants, a wide-brimmed hat, and sunglasses year round, whenever you are outdoors.  Notify your caregiver of new moles or changes in moles, especially if there is a change in shape or color. Also notify your caregiver if a mole is larger than the size of a pencil eraser.  Stay current with your immunizations. Document Released: 09/20/2010 Document Revised: 07/02/2012 Document Reviewed: 09/20/2010 [facility] Patient Information 2014 [place].

## 2013-04-11 NOTE — Discharge Instructions (Signed)

## 2013-04-12 ENCOUNTER — Encounter: Payer: Self-pay | Admitting: Obstetrics & Gynecology

## 2013-04-12 DIAGNOSIS — B373 Candidiasis of vulva and vagina: Secondary | ICD-10-CM | POA: Insufficient documentation

## 2013-04-12 DIAGNOSIS — Z01419 Encounter for gynecological examination (general) (routine) without abnormal findings: Secondary | ICD-10-CM | POA: Insufficient documentation

## 2013-04-12 DIAGNOSIS — B3731 Acute candidiasis of vulva and vagina: Secondary | ICD-10-CM | POA: Insufficient documentation

## 2013-04-12 LAB — HIV ANTIBODY (ROUTINE TESTING W REFLEX): HIV: NONREACTIVE

## 2013-04-12 LAB — RPR

## 2013-04-15 LAB — PAP IG, CT-NG, RFX HPV ASCU
CHLAMYDIA PROBE AMP: NEGATIVE
GC PROBE AMP: NEGATIVE

## 2013-04-16 ENCOUNTER — Encounter: Payer: Self-pay | Admitting: Obstetrics & Gynecology

## 2013-04-16 DIAGNOSIS — R87612 Low grade squamous intraepithelial lesion on cytologic smear of cervix (LGSIL): Secondary | ICD-10-CM | POA: Insufficient documentation

## 2013-04-18 ENCOUNTER — Emergency Department (INDEPENDENT_AMBULATORY_CARE_PROVIDER_SITE_OTHER): Payer: BC Managed Care – PPO

## 2013-04-18 ENCOUNTER — Emergency Department (HOSPITAL_COMMUNITY)
Admission: EM | Admit: 2013-04-18 | Discharge: 2013-04-18 | Disposition: A | Discharge status: Home / Self Care | Payer: BC Managed Care – PPO | Source: Home / Self Care | Attending: Family Medicine | Admitting: Family Medicine

## 2013-04-18 ENCOUNTER — Encounter (HOSPITAL_COMMUNITY): Payer: Self-pay | Admitting: Emergency Medicine

## 2013-04-18 DIAGNOSIS — M25469 Effusion, unspecified knee: Secondary | ICD-10-CM

## 2013-04-18 DIAGNOSIS — M25462 Effusion, left knee: Secondary | ICD-10-CM

## 2013-04-18 MED ORDER — IBUPROFEN 800 MG PO TABS: 800.0000 mg | ORAL_TABLET | Freq: Three times a day (TID) | ORAL | Status: DC

## 2013-04-18 MED ORDER — HYDROCODONE-ACETAMINOPHEN 5-325 MG PO TABS: 2.0000 | ORAL_TABLET | ORAL | Status: DC | PRN

## 2013-04-18 NOTE — Discharge Instructions (Signed)

## 2013-04-18 NOTE — ED Notes (Signed)
C/o L knee pain onset 1 week ago.  No known injury.  Has had swelling, pain all the time,  and difficulty bending it and walking.

## 2013-04-18 NOTE — ED Provider Notes (Signed)
CSN: [id number]     Arrival date & time 04/18/13  1717 History   First MD Initiated Contact with Patient 04/18/13 1748     Chief Complaint  Patient presents with  . Knee Pain   (Consider location/radiation/quality/duration/timing/severity/associated sxs/prior Treatment) Patient is a 38 y.o. female presenting with knee pain. The history is provided by the patient. No language interpreter was used.  Knee Pain Location:  Knee Time since incident:  1 week Injury: no   Knee location:  L knee Pain details:    Quality:  Aching   Radiates to:  Does not radiate   Severity:  Mild   Onset quality:  Sudden   Timing:  Constant Chronicity:  New Dislocation: no   Foreign body present:  No foreign bodies Relieved by:  Nothing Worsened by:  Nothing tried Ineffective treatments:  None tried   Past Medical History  Diagnosis Date  . Diabetes mellitus without complication   . Asthma    Past Surgical History  Procedure Laterality Date  . Cervical biopsy  w/ loop electrode excision    . Whipple procedure    . Cervical tumor removed    . Dilation and curettage of uterus  2012   Family History  Problem Relation Age of Onset  . Hypertension Mother   . Diabetes Father    History  Substance Use Topics  . Smoking status: Former Smoker    Types: Cigarettes  . Smokeless tobacco: Never Used  . Alcohol Use: Yes     Comment: occaisionally   OB History   Grav Para Term Preterm Abortions TAB SAB Ect Mult Living   4    4  4         Review of Systems  All other systems reviewed and are negative.    Allergies  Amoxicillin and Sulfa antibiotics  Home Medications   Current Outpatient Rx  Name  Route  Sig  Dispense  Refill  . insulin aspart (NOVOLOG) 100 UNIT/ML injection   Subcutaneous   Inject 9 Units into the skin 3 (three) times daily before meals.   1 vial   12   . insulin detemir (LEVEMIR) 100 UNIT/ML injection      Take 7 units BID   10 mL   11   . Insulin Syringes,  Disposable, U-100 1 ML MISC      Use as needed for insulin injection.   100 each   99   . omeprazole (PRILOSEC) 40 MG capsule               . Prenatal Vit-Fe Fumarate-FA (PRENATAL MULTIVITAMIN) TABS tablet   Oral   Take 1 tablet by mouth daily at 12 noon.         Marland Kitchen terconazole (TERAZOL 7) 0.4 % vaginal cream   Vaginal   Place 1 applicator vaginally at bedtime.   45 g   0   . vitamin C (ASCORBIC ACID) 500 MG tablet   Oral   Take 500 mg by mouth 2 (two) times daily.         Marland Kitchen albuterol (PROVENTIL HFA;VENTOLIN HFA) 108 (90 BASE) MCG/ACT inhaler   Inhalation   Inhale 2 puffs into the lungs every 4 (four) hours as needed for wheezing or shortness of breath.   1 Inhaler   11   . insulin aspart (NOVOLOG) 100 UNIT/ML injection      9 units before each meal   10 mL   11   . Pancrelipase, Lip-Prot-Amyl, (  PANCREAZE) 32951 UNITS CPEP      Take 1 before each meal   90 capsule   11   . terconazole (TERAZOL 7) 0.4 % vaginal cream   Vaginal   Place 1 applicator vaginally at bedtime.   45 g   0    BP 105/61  Pulse 79  Temp(Src) 98.5 F (36.9 C) (Oral)  Resp 20  SpO2 100%  LMP 03/28/2013 Physical Exam  Nursing note and vitals reviewed. Constitutional: She appears well-developed and well-nourished.  HENT:  Head: Normocephalic.  Eyes: Pupils are equal, round, and reactive to light.  Neck: Normal range of motion.  Musculoskeletal:  Swollen tender left knee,  From,  nv and ns intact  Pain with range of motion  Neurological: She is alert.  Skin: Skin is warm.    ED Course  Procedures (including critical care time) Labs Review Labs Reviewed - No data to display Imaging Review Dg Knee Complete 4 Views Left  04/18/2013   CLINICAL DATA:  Increasing knee pain and swelling for 6 days. Remote knee injury 20 years ago.  EXAM: LEFT KNEE - COMPLETE 4+ VIEW  COMPARISON:  None.  FINDINGS: There is a small knee joint effusion. There is no evidence of acute fracture or  dislocation. Minimal medial compartment marginal spurring is present. Joint widths are maintained. No focal lytic or blastic osseous lesion is seen. No soft tissue abnormality is identified.  IMPRESSION: Small knee joint effusion.  No acute osseous abnormality.   Electronically Signed   By: Logan Bores   On: 04/18/2013 18:26      MDM   1. Knee effusion, left    Pt placed in knee immbolizer,    I advised follow up with Dr. Doran Durand for evaluation.   Rx for ibuprofen and hydrocodone    Fransico Meadow, [place] 04/18/13 1922

## 2013-04-18 NOTE — ED Provider Notes (Signed)
Medical screening examination/treatment/procedure(s) were performed by resident physician or non-physician practitioner and as supervising physician I was immediately available for consultation/collaboration.   Pauline Good MD.   Billy Fischer, MD 04/18/13 2022

## 2013-05-16 ENCOUNTER — Encounter: Payer: BC Managed Care – PPO | Admitting: Obstetrics & Gynecology

## 2013-05-16 ENCOUNTER — Ambulatory Visit: Payer: BC Managed Care – PPO | Admitting: Internal Medicine

## 2013-05-21 ENCOUNTER — Telehealth: Payer: Self-pay

## 2013-05-21 NOTE — Telephone Encounter (Signed)
Unable to reach patient.   Pap-EPITHELIAL CELL ABNORMALITY: SQUAMOUS CELLS; LOW-GRADE SQUAMOUS INTRAEPITHELIAL LESION (LSIL) ENCOMPASSING: HPV/MILD DYSPLASIA/CIN1. Flu- Td-

## 2013-05-22 NOTE — Telephone Encounter (Signed)
Appointment cancelled

## 2013-05-23 ENCOUNTER — Ambulatory Visit: Payer: BC Managed Care – PPO | Admitting: Family Medicine

## 2013-05-30 ENCOUNTER — Encounter: Payer: Self-pay | Admitting: Obstetrics & Gynecology

## 2013-05-30 ENCOUNTER — Encounter: Payer: BC Managed Care – PPO | Admitting: Obstetrics & Gynecology

## 2013-06-13 ENCOUNTER — Encounter: Payer: BC Managed Care – PPO | Admitting: Obstetrics & Gynecology

## 2013-07-25 ENCOUNTER — Other Ambulatory Visit: Payer: Self-pay

## 2013-07-25 ENCOUNTER — Encounter: Payer: Self-pay | Admitting: Obstetrics & Gynecology

## 2013-07-25 ENCOUNTER — Ambulatory Visit (INDEPENDENT_AMBULATORY_CARE_PROVIDER_SITE_OTHER): Payer: BC Managed Care – PPO | Admitting: Obstetrics & Gynecology

## 2013-07-25 VITALS — BP 100/68 | HR 89 | Temp 99.3°F | Ht 70.0 in | Wt 180.0 lb

## 2013-07-25 DIAGNOSIS — Z01812 Encounter for preprocedural laboratory examination: Secondary | ICD-10-CM

## 2013-07-25 DIAGNOSIS — Z3202 Encounter for pregnancy test, result negative: Secondary | ICD-10-CM

## 2013-07-25 DIAGNOSIS — N9489 Other specified conditions associated with female genital organs and menstrual cycle: Secondary | ICD-10-CM

## 2013-07-25 DIAGNOSIS — R87612 Low grade squamous intraepithelial lesion on cytologic smear of cervix (LGSIL): Secondary | ICD-10-CM

## 2013-07-25 LAB — POCT URINE PREGNANCY: PREG TEST UR: NEGATIVE

## 2013-07-25 MED ORDER — MEFENAMIC ACID 250 MG PO CAPS: ORAL_CAPSULE | ORAL | Status: DC

## 2013-07-26 ENCOUNTER — Emergency Department (HOSPITAL_COMMUNITY): Payer: BC Managed Care – PPO

## 2013-07-26 ENCOUNTER — Encounter (HOSPITAL_COMMUNITY): Payer: Self-pay | Admitting: Emergency Medicine

## 2013-07-26 ENCOUNTER — Emergency Department (HOSPITAL_COMMUNITY)
Admission: EM | Admit: 2013-07-26 | Discharge: 2013-07-27 | Disposition: A | Discharge status: Home / Self Care | Payer: BC Managed Care – PPO | Attending: Emergency Medicine | Admitting: Emergency Medicine

## 2013-07-26 ENCOUNTER — Ambulatory Visit: Payer: BC Managed Care – PPO | Admitting: Family Medicine

## 2013-07-26 DIAGNOSIS — N939 Abnormal uterine and vaginal bleeding, unspecified: Secondary | ICD-10-CM

## 2013-07-26 DIAGNOSIS — Z794 Long term (current) use of insulin: Secondary | ICD-10-CM | POA: Insufficient documentation

## 2013-07-26 DIAGNOSIS — Z87891 Personal history of nicotine dependence: Secondary | ICD-10-CM | POA: Insufficient documentation

## 2013-07-26 DIAGNOSIS — J45909 Unspecified asthma, uncomplicated: Secondary | ICD-10-CM | POA: Insufficient documentation

## 2013-07-26 DIAGNOSIS — D259 Leiomyoma of uterus, unspecified: Secondary | ICD-10-CM | POA: Insufficient documentation

## 2013-07-26 DIAGNOSIS — Z3202 Encounter for pregnancy test, result negative: Secondary | ICD-10-CM | POA: Insufficient documentation

## 2013-07-26 DIAGNOSIS — R197 Diarrhea, unspecified: Secondary | ICD-10-CM | POA: Insufficient documentation

## 2013-07-26 DIAGNOSIS — R110 Nausea: Secondary | ICD-10-CM | POA: Insufficient documentation

## 2013-07-26 DIAGNOSIS — R739 Hyperglycemia, unspecified: Secondary | ICD-10-CM

## 2013-07-26 DIAGNOSIS — Z880 Allergy status to penicillin: Secondary | ICD-10-CM | POA: Insufficient documentation

## 2013-07-26 DIAGNOSIS — R102 Pelvic and perineal pain: Secondary | ICD-10-CM

## 2013-07-26 DIAGNOSIS — E119 Type 2 diabetes mellitus without complications: Secondary | ICD-10-CM | POA: Insufficient documentation

## 2013-07-26 DIAGNOSIS — Z79899 Other long term (current) drug therapy: Secondary | ICD-10-CM | POA: Insufficient documentation

## 2013-07-26 DIAGNOSIS — N898 Other specified noninflammatory disorders of vagina: Secondary | ICD-10-CM | POA: Insufficient documentation

## 2013-07-26 LAB — URINALYSIS, ROUTINE W REFLEX MICROSCOPIC
BILIRUBIN URINE: NEGATIVE
Glucose, UA: >1000 mg/dL — AB
KETONES UR: NEGATIVE mg/dL
Leukocytes, UA: NEGATIVE
Nitrite: NEGATIVE
PH: 5.5 (ref 5.0–8.0)
PROTEIN: NEGATIVE mg/dL
Specific Gravity, Urine: 1.041 — ABNORMAL HIGH (ref 1.005–1.030)
Urobilinogen, UA: 0.2 mg/dL (ref 0.0–1.0)

## 2013-07-26 LAB — COMPREHENSIVE METABOLIC PANEL
ALT: 17 U/L (ref 0–35)
AST: 17 U/L (ref 0–37)
Albumin: 3.9 g/dL (ref 3.5–5.2)
Alkaline Phosphatase: 59 U/L (ref 39–117)
BILIRUBIN TOTAL: 0.4 mg/dL (ref 0.3–1.2)
BUN: 5 mg/dL — AB (ref 6–23)
CO2: 20 meq/L (ref 19–32)
CREATININE: 0.59 mg/dL (ref 0.50–1.10)
Calcium: 8.6 mg/dL (ref 8.4–10.5)
Chloride: 95 mEq/L — ABNORMAL LOW (ref 96–112)
GFR calc non Af Amer: >90 mL/min (ref >90)
GLUCOSE: 549 mg/dL — AB (ref 70–99)
Potassium: 4.3 mEq/L (ref 3.7–5.3)
Sodium: 131 mEq/L — ABNORMAL LOW (ref 137–147)
Total Protein: 7.1 g/dL (ref 6.0–8.3)

## 2013-07-26 LAB — CBC WITH DIFFERENTIAL/PLATELET
BASOS ABS: 0 10*3/uL (ref 0.0–0.1)
Basophils Relative: 0 % (ref 0–1)
EOS PCT: 1 % (ref 0–5)
Eosinophils Absolute: 0 10*3/uL (ref 0.0–0.7)
HEMATOCRIT: 37.5 % (ref 36.0–46.0)
HEMOGLOBIN: 12.8 g/dL (ref 12.0–15.0)
LYMPHS PCT: 28 % (ref 12–46)
Lymphs Abs: 1.8 10*3/uL (ref 0.7–4.0)
MCH: 29.2 pg (ref 26.0–34.0)
MCHC: 34.1 g/dL (ref 30.0–36.0)
MCV: 85.6 fL (ref 78.0–100.0)
MONO ABS: 0.4 10*3/uL (ref 0.1–1.0)
MONOS PCT: 7 % (ref 3–12)
Neutro Abs: 4.1 10*3/uL (ref 1.7–7.7)
Neutrophils Relative %: 64 % (ref 43–77)
Platelets: 177 10*3/uL (ref 150–400)
RBC: 4.38 MIL/uL (ref 3.87–5.11)
RDW: 12.8 % (ref 11.5–15.5)
WBC: 6.3 10*3/uL (ref 4.0–10.5)

## 2013-07-26 LAB — URINE MICROSCOPIC-ADD ON

## 2013-07-26 LAB — WET PREP, GENITAL
Clue Cells Wet Prep HPF POC: NONE SEEN
TRICH WET PREP: NONE SEEN
Yeast Wet Prep HPF POC: NONE SEEN

## 2013-07-26 LAB — POC URINE PREG, ED: PREG TEST UR: NEGATIVE

## 2013-07-26 LAB — CBG MONITORING, ED: GLUCOSE-CAPILLARY: 257 mg/dL — AB (ref 70–99)

## 2013-07-26 LAB — LIPASE, BLOOD: Lipase: 5 U/L — ABNORMAL LOW (ref 11–59)

## 2013-07-26 MED ORDER — MORPHINE SULFATE 4 MG/ML IJ SOLN
4.0000 mg | Freq: Once | INTRAMUSCULAR | Status: AC
  Administered 2013-07-26: 4 mg via INTRAVENOUS
  Filled 2013-07-26: qty 1

## 2013-07-26 MED ORDER — SODIUM CHLORIDE 0.9 % IV BOLUS (SEPSIS)
1000.0000 mL | Freq: Once | INTRAVENOUS | Status: AC
  Administered 2013-07-26: 1000 mL via INTRAVENOUS

## 2013-07-26 NOTE — ED Provider Notes (Signed)
CSN: [id number]     Arrival date & time 07/26/13  1536 History   None    Chief Complaint  Patient presents with  . Abdominal Pain   HPI  Kristina Neal is a 38 y.o. female with a PMH of DM and asthma who presents to the ED for evaluation of abdominal pain. History was provided by the patient. Patient has been having vaginal bleeding and pelvic pain for the past 9 days (07/18/13). Pain is a constant middle lower pelvic cramping sensation. Nothing makes her pain better/worse. Saw her OB/GYN yesterday (Dr. Delsa Sale) for this and was prescribed mefenamic acid, which has not been relieving her pain. Also had a colposcopy in the office. Has also been trying Ibuprofen with no relief. Patient has had mild-moderate constant vaginal bleeding. Denies clots, hemorrhage, or vaginal discharge. Has also had nausea with no emesis. Also watery brown diarrhea. No hematochezia. Patient sexually active with no new partners. G4P0. No fever, chills, lightheadedness, dizziness, dysuria, or other concerns.   Past Medical History  Diagnosis Date  . Diabetes mellitus without complication   . Asthma    Past Surgical History  Procedure Laterality Date  . Cervical biopsy  w/ loop electrode excision    . Whipple procedure    . Cervical tumor removed    . Dilation and curettage of uterus  2012   Family History  Problem Relation Age of Onset  . Hypertension Mother   . Diabetes Father    History  Substance Use Topics  . Smoking status: Former Smoker    Types: Cigarettes  . Smokeless tobacco: Never Used  . Alcohol Use: Yes     Comment: occaisionally   OB History   Grav Para Term Preterm Abortions TAB SAB Ect Mult Living   4    4  4          Review of Systems  Constitutional: Negative for fever, chills, diaphoresis, activity change, appetite change and fatigue.  Respiratory: Negative for cough and shortness of breath.   Cardiovascular: Negative for chest pain and leg swelling.   Gastrointestinal: Positive for nausea, abdominal pain and diarrhea. Negative for vomiting, constipation and blood in stool.  Genitourinary: Positive for vaginal bleeding, menstrual problem and pelvic pain. Negative for dysuria, urgency, frequency, hematuria, flank pain, decreased urine volume, vaginal discharge, difficulty urinating, genital sores and vaginal pain.  Musculoskeletal: Negative for back pain and myalgias.  Neurological: Negative for dizziness, weakness, light-headedness and headaches.    Allergies  Sulfa antibiotics and Amoxicillin  Home Medications   Prior to Admission medications   Medication Sig Start Date End Date Taking? Authorizing Provider  albuterol (PROVENTIL HFA;VENTOLIN HFA) 108 (90 BASE) MCG/ACT inhaler Inhale 1-2 puffs into the lungs every 6 (six) hours as needed for wheezing or shortness of breath.   Yes Historical Provider, MD  ibuprofen (ADVIL,MOTRIN) 800 MG tablet Take 800 mg by mouth every 8 (eight) hours as needed for mild pain.   Yes Historical Provider, MD  insulin aspart (NOVOLOG) 100 UNIT/ML injection Inject 9 Units into the skin 3 (three) times daily before meals.   Yes Historical Provider, MD  insulin detemir (LEVEMIR) 100 UNIT/ML injection Inject 9 Units into the skin 2 (two) times daily.   Yes Historical Provider, MD  Mefenamic Acid 250 MG CAPS Take 250 mg by mouth every 6 (six) hours as needed (for pain).    Yes Historical Provider, MD  omeprazole (PRILOSEC) 40 MG capsule Take 40 mg by mouth daily as needed (for  acid reflux).   Yes Historical Provider, MD  Pancrelipase, Lip-Prot-Amyl, 16800 UNITS CPEP Take 1 capsule by mouth See admin instructions. *takes 1 capsule with a meal*   Yes Historical Provider, MD  Prenatal Vit-Fe Fumarate-FA (PRENATAL MULTIVITAMIN) TABS tablet Take 1 tablet by mouth daily at 12 noon.   Yes Historical Provider, MD  vitamin C (ASCORBIC ACID) 500 MG tablet Take 500 mg by mouth 2 (two) times daily.   Yes Historical Provider, MD    BP 107/70  Pulse 82  Temp(Src) 98.8 F (37.1 C) (Oral)  Resp 16  Ht 5\' 10"  (1.778 m)  Wt 170 lb (77.111 kg)  BMI 24.39 kg/m2  SpO2 96%  LMP 07/18/2013  Filed Vitals:   07/26/13 2230 07/26/13 2245 07/26/13 2300 07/27/13 0047  BP: 105/70 110/67 106/68 102/57  Pulse: 64 72 67 60  Temp:      TempSrc:      Resp:      Height:      Weight:      SpO2: 99% 100% 99% 97%    Physical Exam  Nursing note and vitals reviewed. Constitutional: She is oriented to person, place, and time. She appears well-developed and well-nourished. No distress.  HENT:  Head: Normocephalic and atraumatic.  Right Ear: External ear normal.  Left Ear: External ear normal.  Nose: Nose normal.  Mouth/Throat: Oropharynx is clear and moist.  Eyes: Conjunctivae are normal. Right eye exhibits no discharge. Left eye exhibits no discharge.  Neck: Normal range of motion. Neck supple.  Cardiovascular: Normal rate, regular rhythm, normal heart sounds and intact distal pulses.  Exam reveals no gallop and no friction rub.   No murmur heard. Pulmonary/Chest: Effort normal and breath sounds normal. No respiratory distress. She has no wheezes. She has no rales. She exhibits no tenderness.  Abdominal: Soft. Bowel sounds are normal. She exhibits no distension and no mass. There is tenderness. There is no rebound and no guarding.  Middle lower pelvic pain  Musculoskeletal: Normal range of motion. She exhibits no edema and no tenderness.  Neurological: She is alert and oriented to person, place, and time.  Skin: Skin is warm and dry. She is not diaphoretic.     ED Course  Procedures (including critical care time) Labs Review Labs Reviewed  COMPREHENSIVE METABOLIC PANEL - Abnormal; Notable for the following:    Sodium 131 (*)    Chloride 95 (*)    Glucose, Bld 549 (*)    BUN 5 (*)    All other components within normal limits  LIPASE, BLOOD - Abnormal; Notable for the following:    Lipase 5 (*)    All other  components within normal limits  URINALYSIS, ROUTINE W REFLEX MICROSCOPIC - Abnormal; Notable for the following:    Specific Gravity, Urine 1.041 (*)    Glucose, UA >1000 (*)    Hgb urine dipstick MODERATE (*)    All other components within normal limits  URINE MICROSCOPIC-ADD ON - Abnormal; Notable for the following:    Squamous Epithelial / LPF FEW (*)    All other components within normal limits  CBC WITH DIFFERENTIAL  POC URINE PREG, ED    Imaging Review US Transvaginal Non-ob  07/27/2013   CLINICAL DATA:  Abdominal pain.  EXAM: TRANSABDOMINAL ULTRASOUND OF PELVIS  TECHNIQUE: Transabdominal ultrasound examination of the pelvis was performed including evaluation of the uterus, ovaries, adnexal regions, and pelvic cul-de-sac.  COMPARISON:  None.  FINDINGS: Uterus  Measurements: 85 mm x 44 mm x  16 mm. There is a left lateral pedunculated fibroid. Delete that  Endometrium  Thickness: 5 mm, normal.  Right ovary  Measurements: 47 mm x 17 mm x 25 mm.  Normal physiologic appearance.  Left ovary  Measurements: 40 mm x 15 mm x 30 mm.  Normal physiologic appearance.  Other findings:  Small amount of free fluid, physiologic.  IMPRESSION: 1. Pedunculated fibroid extending off the left lateral uterine fundus. 2. Normal appearance of the ovaries. 3. Small amount of free fluid is probably physiologic.   Electronically Signed   By: Dereck Ligas M.D.   On: 07/27/2013 00:07   US Pelvis Complete  07/27/2013   CLINICAL DATA:  Abdominal pain.  EXAM: TRANSABDOMINAL ULTRASOUND OF PELVIS  TECHNIQUE: Transabdominal ultrasound examination of the pelvis was performed including evaluation of the uterus, ovaries, adnexal regions, and pelvic cul-de-sac.  COMPARISON:  None.  FINDINGS: Uterus  Measurements: 85 mm x 44 mm x 16 mm. There is a left lateral pedunculated fibroid. Delete that  Endometrium  Thickness: 5 mm, normal.  Right ovary  Measurements: 47 mm x 17 mm x 25 mm.  Normal physiologic appearance.  Left ovary   Measurements: 40 mm x 15 mm x 30 mm.  Normal physiologic appearance.  Other findings:  Small amount of free fluid, physiologic.  IMPRESSION: 1. Pedunculated fibroid extending off the left lateral uterine fundus. 2. Normal appearance of the ovaries. 3. Small amount of free fluid is probably physiologic.   Electronically Signed   By: Dereck Ligas M.D.   On: 07/27/2013 00:07     EKG Interpretation None      Results for orders placed during the hospital encounter of 07/26/13  GC/CHLAMYDIA PROBE AMP      Result Value Ref Range   CT Probe RNA NEGATIVE  NEGATIVE   GC Probe RNA NEGATIVE  NEGATIVE  WET PREP, GENITAL      Result Value Ref Range   Yeast Wet Prep HPF POC NONE SEEN  NONE SEEN   Trich, Wet Prep NONE SEEN  NONE SEEN   Clue Cells Wet Prep HPF POC NONE SEEN  NONE SEEN   WBC, Wet Prep HPF POC MANY (*) NONE SEEN  CBC WITH DIFFERENTIAL      Result Value Ref Range   WBC 6.3  4.0 - 10.5 K/uL   RBC 4.38  3.87 - 5.11 MIL/uL   Hemoglobin 12.8  12.0 - 15.0 g/dL   HCT 37.5  36.0 - 46.0 %   MCV 85.6  78.0 - 100.0 fL   MCH 29.2  26.0 - 34.0 pg   MCHC 34.1  30.0 - 36.0 g/dL   RDW 12.8  11.5 - 15.5 %   Platelets 177  150 - 400 K/uL   Neutrophils Relative % 64  43 - 77 %   Neutro Abs 4.1  1.7 - 7.7 K/uL   Lymphocytes Relative 28  12 - 46 %   Lymphs Abs 1.8  0.7 - 4.0 K/uL   Monocytes Relative 7  3 - 12 %   Monocytes Absolute 0.4  0.1 - 1.0 K/uL   Eosinophils Relative 1  0 - 5 %   Eosinophils Absolute 0.0  0.0 - 0.7 K/uL   Basophils Relative 0  0 - 1 %   Basophils Absolute 0.0  0.0 - 0.1 K/uL  COMPREHENSIVE METABOLIC PANEL      Result Value Ref Range   Sodium 131 (*) 137 - 147 mEq/L   Potassium 4.3  3.7 -  5.3 mEq/L   Chloride 95 (*) 96 - 112 mEq/L   CO2 20  19 - 32 mEq/L   Glucose, Bld 549 (*) 70 - 99 mg/dL   BUN 5 (*) 6 - 23 mg/dL   Creatinine, Ser 0.59  0.50 - 1.10 mg/dL   Calcium 8.6  8.4 - 10.5 mg/dL   Total Protein 7.1  6.0 - 8.3 g/dL   Albumin 3.9  3.5 - 5.2 g/dL   AST 17   0 - 37 U/L   ALT 17  0 - 35 U/L   Alkaline Phosphatase 59  39 - 117 U/L   Total Bilirubin 0.4  0.3 - 1.2 mg/dL   GFR calc non Af Amer >90  >90 mL/min   GFR calc Af Amer >90  >90 mL/min  LIPASE, BLOOD      Result Value Ref Range   Lipase 5 (*) 11 - 59 U/L  URINALYSIS, ROUTINE W REFLEX MICROSCOPIC      Result Value Ref Range   Color, Urine YELLOW  YELLOW   APPearance CLEAR  CLEAR   Specific Gravity, Urine 1.041 (*) 1.005 - 1.030   pH 5.5  5.0 - 8.0   Glucose, UA >1000 (*) NEGATIVE mg/dL   Hgb urine dipstick MODERATE (*) NEGATIVE   Bilirubin Urine NEGATIVE  NEGATIVE   Ketones, ur NEGATIVE  NEGATIVE mg/dL   Protein, ur NEGATIVE  NEGATIVE mg/dL   Urobilinogen, UA 0.2  0.0 - 1.0 mg/dL   Nitrite NEGATIVE  NEGATIVE   Leukocytes, UA NEGATIVE  NEGATIVE  URINE MICROSCOPIC-ADD ON      Result Value Ref Range   Squamous Epithelial / LPF FEW (*) RARE   WBC, UA 0-2  <3 WBC/hpf   RBC / HPF 0-2  <3 RBC/hpf  POC URINE PREG, ED      Result Value Ref Range   Preg Test, Ur NEGATIVE  NEGATIVE  CBG MONITORING, ED      Result Value Ref Range   Glucose-Capillary 257 (*) 70 - 99 mg/dL  CBG MONITORING, ED      Result Value Ref Range   Glucose-Capillary 197 (*) 70 - 99 mg/dL     MDM   Kristina Neal is a 38 y.o. female with a PMH of DM and asthma who presents to the ED for evaluation of abdominal pain.  Rechecks  9:00 PM = Pelvic exam at bedside with ED tech present. Firm non-tender circular 3 cm x 3 cm mass palpated in the distal uterus. Minimal vaginal bleeding. Minimal clear discharge. No CMT or adnexal tenderness bilaterally.  12:30 AM = Repeat abdominal exam benign. Patient states pain improved.     Etiology of pelvic pain possibly due to uterine fibroid seen on pelvic US. Abdominal exam benign. Pelvic exam unremarkable. PID unlikely. Labs unremarkable aside from elevated BS (529). AG of 16, however, doubt DKA. No ketonuria. Patient's BS decreased to 197 during her ED visit  with IV fluids. Patient afebrile and non-toxic in appearance. Instructed patient to follow-up with OB/GYN and PCP. Return precautions, discharge instructions, and follow-up was discussed with the patient before discharge.     Discharge Medication List as of 07/27/2013 12:42 AM    START taking these medications   Details  HYDROcodone-acetaminophen (NORCO/VICODIN) 5-325 MG per tablet Take 1-2 tablets by mouth every 4 (four) hours as needed., Starting 07/27/2013, Until Discontinued, Print        Final impressions: 1. Pelvic pain   2. Vaginal bleeding   3.  Hyperglycemia   4. Uterine fibroid       Harold Hedge Ramiel Forti PA-C          Lucila Maine, PA-C 07/27/13 1133

## 2013-07-26 NOTE — ED Notes (Signed)
This RN attempting to contact ultrasound for the pt's exam.

## 2013-07-26 NOTE — ED Notes (Signed)
Pt reports that she has been having abd pain and pelvic pressure for the past couple of days. States that she was seen by her OB GYN yesterday and given medication without much relief. Denies and n/v, or urinary symptoms.

## 2013-07-27 LAB — GC/CHLAMYDIA PROBE AMP
CT PROBE, AMP APTIMA: NEGATIVE
GC Probe RNA: NEGATIVE

## 2013-07-27 LAB — CBG MONITORING, ED: GLUCOSE-CAPILLARY: 197 mg/dL — AB (ref 70–99)

## 2013-07-27 MED ORDER — HYDROCODONE-ACETAMINOPHEN 5-325 MG PO TABS: 1.0000 | ORAL_TABLET | ORAL | Status: DC | PRN

## 2013-07-27 NOTE — ED Provider Notes (Signed)
Medical screening examination/treatment/procedure(s) were performed by non-physician practitioner and as supervising physician I was immediately available for consultation/collaboration.   EKG Interpretation None       Orlie Dakin, MD 07/27/13 1456

## 2013-07-27 NOTE — Discharge Instructions (Signed)
Follow-up with your OB/GYN as soon as possible  Take Ibuprofen for mild-moderate pain Take Vicodin for severe pain - Please be careful with this medication.  It can cause drowsiness.  Use caution while driving, operating machinery, drinking alcohol, or any other activities that may impair your physical or mental abilities.   Continue to check your BS  Return to the emergency department if you develop any changing/worsening condition, fever, repeated vomiting, changing abdominal pain, feeling lightheaded, or any other concerns (please read additional information regarding your condition below)     Pelvic Pain, Female Female pelvic pain can be caused by many different things and start from a variety of places. Pelvic pain refers to pain that is located in the lower half of the abdomen and between your hips. The pain may occur over a short period of time (acute) or may be reoccurring (chronic). The cause of pelvic pain may be related to disorders affecting the female reproductive organs (gynecologic), but it may also be related to the bladder, kidney stones, an intestinal complication, or muscle or skeletal problems. Getting help right away for pelvic pain is important, especially if there has been severe, sharp, or a sudden onset of unusual pain. It is also important to get help right away because some types of pelvic pain can be life threatening.  CAUSES  Below are only some of the causes of pelvic pain. The causes of pelvic pain can be in one of several categories.   Gynecologic.  Pelvic inflammatory disease.  Sexually transmitted infection.  Ovarian cyst or a twisted ovarian ligament (ovarian torsion).  Uterine lining that grows outside the uterus (endometriosis).  Fibroids, cysts, or tumors.  Ovulation.  Pregnancy.  Pregnancy that occurs outside the uterus (ectopic pregnancy).  Miscarriage.  Labor.  Abruption of the placenta or ruptured uterus.  Infection.  Uterine infection  (endometritis).  Bladder infection.  Diverticulitis.  Miscarriage related to a uterine infection (septic abortion).  Bladder.  Inflammation of the bladder (cystitis).  Kidney stone(s).  Gastrointenstinal.  Constipation.  Diverticulitis.  Neurologic.  Trauma.  Feeling pelvic pain because of mental or emotional causes (psychosomatic).  Cancers of the bowel or pelvis. EVALUATION  Your caregiver will want to take a careful history of your concerns. This includes recent changes in your health, a careful gynecologic history of your periods (menses), and a sexual history. Obtaining your family history and medical history is also important. Your caregiver may suggest a pelvic exam. A pelvic exam will help identify the location and severity of the pain. It also helps in the evaluation of which organ system may be involved. In order to identify the cause of the pelvic pain and be properly treated, your caregiver may order tests. These tests may include:   A pregnancy test.  Pelvic ultrasonography.  An X-ray exam of the abdomen.  A urinalysis or evaluation of vaginal discharge.  Blood tests. HOME CARE INSTRUCTIONS   Only take over-the-counter or prescription medicines for pain, discomfort, or fever as directed by your caregiver.   Rest as directed by your caregiver.   Eat a balanced diet.   Drink enough fluids to make your urine clear or pale yellow, or as directed.   Avoid sexual intercourse if it causes pain.   Apply warm or cold compresses to the lower abdomen depending on which one helps the pain.   Avoid stressful situations.   Keep a journal of your pelvic pain. Write down when it started, where the pain is located, and  if there are things that seem to be associated with the pain, such as food or your menstrual cycle.  Follow up with your caregiver as directed.  SEEK MEDICAL CARE IF:  Your medicine does not help your pain.  You have abnormal vaginal  discharge. SEEK IMMEDIATE MEDICAL CARE IF:   You have heavy bleeding from the vagina.   Your pelvic pain increases.   You feel lightheaded or faint.   You have chills.   You have pain with urination or blood in your urine.   You have uncontrolled diarrhea or vomiting.   You have a fever or persistent symptoms for more than 3 days.  You have a fever and your symptoms suddenly get worse.   You are being physically or sexually abused.  MAKE SURE YOU:  Understand these instructions.  Will watch your condition.  Will get help if you are not doing well or get worse. Document Released: 02/02/2004 Document Revised: 09/06/2011 Document Reviewed: 06/27/2011 [facility] Patient Information 2014 [place], [place].  Menorrhagia Menorrhagia is the medical term for when your menstrual periods are heavy or last longer than usual. With menorrhagia, every period you have may cause enough blood loss and cramping that you are unable to maintain your usual activities. CAUSES  In some cases, the cause of heavy periods is unknown, but a number of conditions may cause menorrhagia. Common causes include:  A problem with the hormone-producing thyroid gland (hypothyroid).  Noncancerous growths in the uterus (polyps or fibroids).  An imbalance of the estrogen and progesterone hormones.  One of your ovaries not releasing an egg during one or more months.  Side effects of having an intrauterine device (IUD).  Side effects of some medicines, such as anti-inflammatory medicines or blood thinners.  A bleeding disorder that stops your blood from clotting normally. SIGNS AND SYMPTOMS  During a normal period, bleeding lasts between 4 and 8 days. Signs that your periods are too heavy include:  You routinely have to change your pad or tampon every 1 or 2 hours because it is completely soaked.  You pass blood clots larger than 1 inch (2.5 cm) in size.  You have bleeding for more than 7  days.  You need to use pads and tampons at the same time because of heavy bleeding.  You need to wake up to change your pads or tampons during the night.  You have symptoms of anemia, such as tiredness, fatigue, or shortness of breath. DIAGNOSIS  Your health care provider will perform a physical exam and ask you questions about your symptoms and menstrual history. Other tests may be ordered based on what the health care provider finds during the exam. These tests can include:  Blood tests To check if you are pregnant or have hormonal changes, a bleeding or thyroid disorder, low iron levels (anemia), or other problems.  Endometrial biopsy Your health care provider takes a sample of tissue from the inside of your uterus to be examined under a microscope.  Pelvic ultrasound This test uses sound waves to make a picture of your uterus, ovaries, and vagina. The pictures can show if you have fibroids or other growths.  Hysteroscopy For this test, your health care provider will use a small telescope to look inside your uterus. Based on the results of your initial tests, your health care provider may recommend further testing. TREATMENT  Treatment may not be needed. If it is needed, your health care provider may recommend treatment with one or more medicines  first. If these do not reduce bleeding enough, a surgical treatment might be an option. The best treatment for you will depend on:   Whether you need to prevent pregnancy.  Your desire to have children in the future.  The cause and severity of your bleeding.  Your opinion and personal preference.  Medicines for menorrhagia may include:  Birth control methods that use hormones These include the pill, skin patch, vaginal ring, shots that you get every 3 months, hormonal IUD, and implant. These treatments reduce bleeding during your menstrual period.  Medicines that thicken blood and slow bleeding.  Medicines that reduce swelling, such  as ibuprofen.  Medicines that contain a synthetic hormone called progestin.   Medicines that make the ovaries stop working for a short time.  You may need surgical treatment for menorrhagia if the medicines are unsuccessful. Treatment options include:  Dilation and curettage (D&C) In this procedure, your health care provider opens (dilates) your cervix and then scrapes or suctions tissue from the lining of your uterus to reduce menstrual bleeding.  Operative hysteroscopy This procedure uses a tiny tube with a light (hysteroscope) to view your uterine cavity and can help in the surgical removal of a polyp that may be causing heavy periods.  Endometrial ablation Through various techniques, your health care provider permanently destroys the entire lining of your uterus (endometrium). After endometrial ablation, most women have little or no menstrual flow. Endometrial ablation reduces your ability to become pregnant.  Endometrial resection This surgical procedure uses an electrosurgical wire loop to remove the lining of the uterus. This procedure also reduces your ability to become pregnant.  Hysterectomy Surgical removal of the uterus and cervix is a permanent procedure that stops menstrual periods. Pregnancy is not possible after a hysterectomy. This procedure requires anesthesia and hospitalization. HOME CARE INSTRUCTIONS   Only take over-the-counter or prescription medicines as directed by your health care provider. Take prescribed medicines exactly as directed. Do not change or switch medicines without consulting your health care provider.  Take any prescribed iron pills exactly as directed by your health care provider. Long-term heavy bleeding may result in low iron levels. Iron pills help replace the iron your body lost from heavy bleeding. Iron may cause constipation. If this becomes a problem, increase the bran, fruits, and roughage in your diet.  Do not take aspirin or medicines that  contain aspirin 1 week before or during your menstrual period. Aspirin may make the bleeding worse.  If you need to change your sanitary pad or tampon more than once every 2 hours, stay in bed and rest as much as possible until the bleeding stops.  Eat well-balanced meals. Eat foods high in iron. Examples are leafy green vegetables, meat, liver, eggs, and whole grain breads and cereals. Do not try to lose weight until the abnormal bleeding has stopped and your blood iron level is back to normal. SEEK MEDICAL CARE IF:   You soak through a pad or tampon every 1 or 2 hours, and this happens every time you have a period.  You need to use pads and tampons at the same time because you are bleeding so much.  You need to change your pad or tampon during the night.  You have a period that lasts for more than 8 days.  You pass clots bigger than 1 inch wide.  You have irregular periods that happen more or less often than once a month.  You feel dizzy or faint.  You feel  very weak or tired.  You feel short of breath or feel your heart is beating too fast when you exercise.  You have nausea and vomiting or diarrhea while you are taking your medicine.  You have any problems that may be related to the medicine you are taking. SEEK IMMEDIATE MEDICAL CARE IF:   You soak through 4 or more pads or tampons in 2 hours.  You have any bleeding while you are pregnant. MAKE SURE YOU:   Understand these instructions.  Will watch your condition.  Will get help right away if you are not doing well or get worse. Document Released: 03/07/2005 Document Revised: 12/26/2012 Document Reviewed: 08/26/2012 [facility] Patient Information 2014 [place].  Hyperglycemia Hyperglycemia occurs when the glucose (sugar) in your blood is too high. Hyperglycemia can happen for many reasons, but it most often happens to people who do not know they have diabetes or are not managing their diabetes properly.  CAUSES    Whether you have diabetes or not, there are other causes of hyperglycemia. Hyperglycemia can occur when you have diabetes, but it can also occur in other situations that you might not be as aware of, such as: Diabetes If you have diabetes and are having problems controlling your blood glucose, hyperglycemia could occur because of some of the following reasons: Not following your meal plan. Not taking your diabetes medications or not taking it properly. Exercising less or doing less activity than you normally do. Being sick. Pre-diabetes This cannot be ignored. Before people develop Type 2 diabetes, they almost always have "pre-diabetes." This is when your blood glucose levels are higher than normal, but not yet high enough to be diagnosed as diabetes. Research has shown that some long-term damage to the body, especially the heart and circulatory system, may already be occurring during pre-diabetes. If you take action to manage your blood glucose when you have pre-diabetes, you may delay or prevent Type 2 diabetes from developing. Stress If you have diabetes, you may be "diet" controlled or on oral medications or insulin to control your diabetes. However, you may find that your blood glucose is higher than usual in the hospital whether you have diabetes or not. This is often referred to as "stress hyperglycemia." Stress can elevate your blood glucose. This happens because of hormones put out by the body during times of stress. If stress has been the cause of your high blood glucose, it can be followed regularly by your caregiver. That way he/she can make sure your hyperglycemia does not continue to get worse or progress to diabetes. Steroids Steroids are medications that act on the infection fighting system (immune system) to block inflammation or infection. One side effect can be a rise in blood glucose. Most people can produce enough extra insulin to allow for this rise, but for those who cannot,  steroids make blood glucose levels go even higher. It is not unusual for steroid treatments to "uncover" diabetes that is developing. It is not always possible to determine if the hyperglycemia will go away after the steroids are stopped. A special blood test called an A1c is sometimes done to determine if your blood glucose was elevated before the steroids were started. SYMPTOMS Thirsty. Frequent urination. Dry mouth. Blurred vision. Tired or fatigue. Weakness. Sleepy. Tingling in feet or leg. DIAGNOSIS  Diagnosis is made by monitoring blood glucose in one or all of the following ways: A1c test. This is a chemical found in your blood. Fingerstick blood glucose monitoring. Laboratory  results. TREATMENT  First, knowing the cause of the hyperglycemia is important before the hyperglycemia can be treated. Treatment may include, but is not be limited to: Education. Change or adjustment in medications. Change or adjustment in meal plan. Treatment for an illness, infection, etc. More frequent blood glucose monitoring. Change in exercise plan. Decreasing or stopping steroids. Lifestyle changes. HOME CARE INSTRUCTIONS  Test your blood glucose as directed. Exercise regularly. Your caregiver will give you instructions about exercise. Pre-diabetes or diabetes which comes on with stress is helped by exercising. Eat wholesome, balanced meals. Eat often and at regular, fixed times. Your caregiver or nutritionist will give you a meal plan to guide your sugar intake. Being at an ideal weight is important. If needed, losing as little as 10 to 15 pounds may help improve blood glucose levels. SEEK MEDICAL CARE IF:  You have questions about medicine, activity, or diet. You continue to have symptoms (problems such as increased thirst, urination, or weight gain). SEEK IMMEDIATE MEDICAL CARE IF:  You are vomiting or have diarrhea. Your breath smells fruity. You are breathing faster or slower. You are  very sleepy or incoherent. You have numbness, tingling, or pain in your feet or hands. You have chest pain. Your symptoms get worse even though you have been following your caregiver's orders. If you have any other questions or concerns. Document Released: 08/31/2000 Document Revised: 05/30/2011 Document Reviewed: 07/04/2011 [facility] Patient Information 2014 [place], [place].   Emergency Department Resource Guide 1) Find a Doctor and Pay Out of Pocket Although you won't have to find out who is covered by your insurance plan, it is a good idea to ask around and get recommendations. You will then need to call the office and see if the doctor you have chosen will accept you as a new patient and what types of options they offer for patients who are self-pay. Some doctors offer discounts or will set up payment plans for their patients who do not have insurance, but you will need to ask so you aren't surprised when you get to your appointment.  2) Contact Your Local Health Department Not all health departments have doctors that can see patients for sick visits, but many do, so it is worth a call to see if yours does. If you don't know where your local health department is, you can check in your phone book. The CDC also has a tool to help you locate your state's health department, and many state websites also have listings of all of their local health departments.  3) Find a [facility] If your illness is not likely to be very severe or complicated, you may want to try a walk in clinic. These are popping up all over the country in pharmacies, drugstores, and shopping centers. They're usually staffed by nurse practitioners or physician assistants that have been trained to treat common illnesses and complaints. They're usually fairly quick and inexpensive. However, if you have serious medical issues or chronic medical problems, these are probably not your best option.  No Primary Care Doctor: - Call  Health Connect at  [phone] - they can help you locate a primary care doctor that  accepts your insurance, provides certain services, etc. - Physician Referral Service- [phone]  Chronic Pain Problems: Organization         Address  Phone   Notes  [facility]  [phone] Patients need to be referred by their primary care doctor.   Medication Assistance: Organization  Address  Phone   Notes  [facility] Medication [facility] [address] [place], [place] [address] [phone] --Must be a resident of [facility] -- Must have NO insurance coverage whatsoever (no Medicaid/ Medicare, etc.) -- The pt. MUST have a primary care doctor that directs their care regularly and follows them in the community   [facility]  [phone]   [employer]  [phone]    Agencies that provide inexpensive medical care: Organization         Address  Phone   Notes  [place]  [phone]   [facility]    [phone]   [facility] [place], [place] [address] [phone]   [place] [address] [place] [phone]   Planned Parenthood    [phone]   [facility]    [phone]   [place] and [place] [address], [place] Phone:  [phone], Fax:  [phone] Hours of Operation:  9 am - 6 pm, M-F.  Also accepts Medicaid/Medicare and self-pay.  [facility] for [place] [address], [address], [place] Phone: [phone], Fax: [phone]. Hours of Operation:  8:30 am - 5:30 pm, M-F.  Also accepts Medicaid and self-pay.  [facility] [place] [address] [place] Phone: [phone]   [place], [place], [place] [phone], Ext. [phone] Mondays & Thursdays: 7-9 AM.  First 15 patients are seen on a first come, first serve  basis.    [place] Providers:  Organization         Address  Phone   Notes  [facility] [address], [place] [phone] Also accepts self-pay patients.  [facility] [id number] [place], [place]  [phone]   [place], [address], [place] [phone]   [facility] [address], [place] [phone]   Lucianne Lei [address], [place]   [phone] Only accepts [place] Access [place] patients after they have their name applied to their card.   Self-Pay (no insurance) in [facility]:  Organization         Address  Phone   Notes  Sickle Cell Patients, [facility] [place] [phone]   [facility] [place] [phone]   [facility] [place]  [place], [place],  [phone]   [facility]/Dr. Osei-Bonsu  [address] [place] or [address], [place] [phone] Phone number for both [place] and [place] locations is the same.  Urgent Medical and [facility] [address], [place] [phone]   [facility] [address], [place] or [address] [phone]   [facility] [address], [place] [phone], phone; [phone], fax Sees patients 1st and 3rd Saturday of every month.  Must not qualify for public or private insurance (i.e. Medicaid, Medicare, [place] Health Choice, Veterans' Benefits)  Household income should be no more than 200% of the poverty level The clinic cannot treat you if you are pregnant or think you are  pregnant  Sexually transmitted diseases are not treated at the clinic.    Dental Care: Organization         Address  Phone  Notes  [facility] [place] [phone] Accepts children up to age 1 who are enrolled in [place] or [place]; pregnant women with a Medicaid card; and children who have applied for Medicaid or [place] Health Choice, but were declined, whose parents can pay a reduced fee at time of service.  [facility] of [facility]  [address], [place] [phone] Accepts children up to age 56 who are enrolled in [place] or [place]; pregnant women with a Medicaid card; and children who have applied for Medicaid or  Health Choice, but were declined, whose parents can pay a reduced fee at time of service.  [place] Adult [facility] Access PRO[facility]  [place] [phone] Patients are seen by appointment only. Walk-ins are not accepted. [place] will see patients 68 years of age and older. Monday - Tuesday (8am-5pm) Most Wednesdays (8:30-5pm) $30 per visit, cash only  [facility] Adult Dental Access PROGRAM  [address], [facility] [phone] Patients are seen by appointment only. Walk-ins are not accepted. [place] will see patients 53 years of age and older. One Wednesday Evening (Monthly: Volunteer Based).  $30 per visit, cash only  [place]  [phone] for adults; Children under age 38, call Graduate Pediatric Dentistry at [phone]. Children aged 60-14, please call [phone] to request a pediatric application.  Dental services are provided in all areas of dental care including fillings, crowns and bridges, complete and partial dentures, implants, gum treatment, root canals, and extractions. Preventive care is also provided. Treatment is provided to both adults and children. Patients are selected via a lottery and there is often a waiting list.   [facility] [address] [place]  [phone]  [email or web address]   [facility] [address] [place], [place] [phone], Ext. [phone] Second and Fourth Thursday of each month, opens at 6:30 AM; Clinic ends at 9 AM.  Patients are seen on a first-come first-served basis, and a limited number are seen during each clinic.   [facility]  [address] [place], [place] [phone]   Eligibility Requirements You must have lived in [place], [place], or [place] counties for at least the last three months.   You cannot be eligible for state or federal sponsored [employer], including [employer], [place], or [employer].   You generally cannot be eligible for healthcare insurance through your employer.    How to apply: Eligibility screenings are held every Tuesday and Wednesday afternoon from 1:00 pm until 4:00 pm. You do not need an appointment for the interview!  [facility] [address], [place], [place]   [place]  [place] Department  [place]  [phone]    Behavioral Health Resources in the Community: Intensive Outpatient Programs Organization         Address  Phone  Notes  [place]. [address], [place], [place] [phone]   [facility] Outpatient [address] [place], [place]   ADS: [facility] [address],  [place], [place]   [place] [address],  [place], [place] or [phone]   Substance Abuse Resources Organization         Address  Phone  Notes  [facility]  [phone]   [place]  [phone]   [facility] [place]   Chinita Pester  [phone]   Residential & Outpatient Substance Abuse Program  [phone]   Psychological Services Organization          Address  Phone  Notes  [facility] [place]  [place]  [phone]   [place] [address], [place] or [phone]    Mobile Crisis Teams Organization         Address  Phone  Notes  [facility], Mobile Crisis Care Unit  [phone]   [facility]  [address] [place], [place]   [place] [address] [place] [phone]    Self-Help/Support Groups Organization         Address  Phone             Notes  [place]. [facility] [place] - variety of support groups  [place] Call for more information  Narcotics Anonymous (NA), [facility] [address], [employer] [place]  2 meetings at this location   Special educational needs teacher         Address  Phone  Notes  [facility] [place],    [place]  [phone]   [facility]  [address] [place] [id number], [place], [place]   [place], [place] [phone] Admissions: 8am-3pm M-F  Incentives Substance [place] [address],    [place], [place] [id number]   [facility] [address] [place], [place], [place]   [facility] [address]  [place], [place]   [facility] - Intensive Outpatient [address], [place], [place]   [facility] [place]  [place], [place] [phone] or [phone]   [facility] ([facility]) [address], [place], [place] Accepts Medicaid  [facility] [place] [address]  [place] [phone] Substance Abuse/Addiction Treatment   [facility] Organization         Address  Phone  Notes  [facility]  [phone]   Domenic Schwab, PhD [address] [place], [place]   [phone] or [phone]   [place], [place] [phone]   [facility] [address], [place], [place] [phone] Insurance/Medicaid/sponsorship through [facility] and Families [address] [place]                                    [place], [place] [phone] [place] [address], [place] [phone]    Dr. Adele Schilder  [phone]   [facility] Dept. 1) [address], [place] 2) [place] 3)  [place] [address], [place] [phone]  [phone]   [place] [phone])  [phone] or [phone] (After Hours)    ° ° ° °

## 2013-07-28 NOTE — Patient Instructions (Signed)
Colposcopy, Care After  Refer to this sheet in the next few weeks. These instructions provide you with information on caring for yourself after your procedure. Your health care provider may also give you more specific instructions. Your treatment has been planned according to current medical practices, but problems sometimes occur. Call your health care provider if you have any problems or questions after your procedure.  WHAT TO EXPECT AFTER THE PROCEDURE   After your procedure, it is typical to have the following:  · Cramping. This often goes away in a few minutes.  · Soreness. This may last for 2 days.  · Lightheadedness. Lie down for a few minutes if this occurs.  You may also have some bleeding or dark discharge for a few days. You may need to wear a sanitary pad during this time.  HOME CARE INSTRUCTIONS  · Avoid sex, douching, and using tampons for 3 days or as directed by your health care provider.  · Only take over-the-counter or prescription medicines as directed by your health care provider. Do not take aspirin because it can cause bleeding.  · Continue to take birth control pills if you are on them.  · Not all test results are available during your visit. If your test results are not back during the visit, make an appointment with your health care provider to find out the results. Do not assume everything is normal if you have not heard from your health care provider or the medical facility. It is important for you to follow up on all of your test results.  · Follow your health care provider's advice regarding activity, follow-up visits, and follow-up Pap tests.  SEEK MEDICAL CARE IF:  · You develop a rash.  · You have problems with your medicine.  SEEK IMMEDIATE MEDICAL CARE IF:  · You are bleeding heavily or are passing blood clots.  · You have a fever.  · You have abnormal vaginal discharge.  · You are having cramps that do not go away after taking your pain medicine.  · You feel lightheaded, dizzy, or  faint.  · You have stomach pain.  Document Released: 12/26/2012 Document Reviewed: 10/04/2012  ExitCare® Patient Information ©2014 [employer].

## 2013-07-28 NOTE — Progress Notes (Signed)
Colposcopy Procedure Note  Indications: Pap smear several months ago showed: low-grade squamous intraepithelial neoplasia (LGSIL - encompassing HPV,mild dysplasia,CIN I). The prior pap showed no abnormalities.    Procedure Details  The risks and benefits of the procedure and Written informed consent obtained.  A time-out was performed confirming the patient, procedure and allergy status  Speculum placed in vagina and excellent visualization of cervix achieved, cervix swabbed x 3 with acetic acid solution.  Findings: Cervix: no visible lesions; SCJ visualized 360 degrees without lesions and endocervical curettage performed.      Physical Exam  Genitourinary:       Specimens: ECC  Complications: none.  Plan: Will base further treatment on Pathology findings. Likely return in 1 yr for a Pap smear with an HPV co-test

## 2013-08-01 ENCOUNTER — Encounter: Payer: Self-pay | Admitting: Obstetrics & Gynecology

## 2013-08-01 ENCOUNTER — Ambulatory Visit (INDEPENDENT_AMBULATORY_CARE_PROVIDER_SITE_OTHER): Payer: BC Managed Care – PPO | Admitting: Obstetrics & Gynecology

## 2013-08-01 VITALS — BP 97/62 | HR 63 | Temp 97.6°F | Ht 70.0 in | Wt 181.0 lb

## 2013-08-01 DIAGNOSIS — N939 Abnormal uterine and vaginal bleeding, unspecified: Secondary | ICD-10-CM

## 2013-08-01 DIAGNOSIS — N926 Irregular menstruation, unspecified: Secondary | ICD-10-CM

## 2013-08-01 DIAGNOSIS — R1030 Lower abdominal pain, unspecified: Secondary | ICD-10-CM

## 2013-08-01 DIAGNOSIS — R109 Unspecified abdominal pain: Secondary | ICD-10-CM

## 2013-08-01 DIAGNOSIS — D252 Subserosal leiomyoma of uterus: Secondary | ICD-10-CM

## 2013-08-01 LAB — CBC
HCT: 36.3 % (ref 36.0–46.0)
Hemoglobin: 11.8 g/dL — ABNORMAL LOW (ref 12.0–15.0)
MCH: 28 pg (ref 26.0–34.0)
MCHC: 32.5 g/dL (ref 30.0–36.0)
MCV: 86.2 fL (ref 78.0–100.0)
PLATELETS: 165 10*3/uL (ref 150–400)
RBC: 4.21 MIL/uL (ref 3.87–5.11)
RDW: 13.6 % (ref 11.5–15.5)
WBC: 5 10*3/uL (ref 4.0–10.5)

## 2013-08-02 ENCOUNTER — Encounter: Payer: Self-pay | Admitting: *Deleted

## 2013-08-02 ENCOUNTER — Other Ambulatory Visit: Payer: Self-pay | Admitting: *Deleted

## 2013-08-04 DIAGNOSIS — R1030 Lower abdominal pain, unspecified: Secondary | ICD-10-CM | POA: Insufficient documentation

## 2013-08-04 DIAGNOSIS — D252 Subserosal leiomyoma of uterus: Secondary | ICD-10-CM | POA: Insufficient documentation

## 2013-08-04 NOTE — Patient Instructions (Signed)
Myomectomy Myomectomy is surgery to remove a noncancerous tumor (myoma) from the uterus. Myomas are tumors made up of fibrous tissue. They are often called fibroid tumors. Fibroid tumors can range from the size of a pea to the size of a grapefruit. In a myomectomy, the fibroid tumor is removed without removing the uterus. Because these tumors are rarely cancerous, this surgery is usually done only if the tumor is growing or causing symptoms such as pain, pressure, bleeding, or pain with intercourse. LET YOUR HEALTH CARE PROVIDER KNOW ABOUT:  Any allergies you have.  All medicines you are taking, including vitamins, herbs, eye drops, creams, and over-the-counter medicines.  Previous problems you or members of your family have had with the use of anesthetics.  Any blood disorders you have.  Previous surgeries you have had.  Medical conditions you have. RISKS AND COMPLICATIONS  Generally, this is a safe procedure. However, as with any procedure, complications can occur. Possible complications include:  Excessive bleeding.  Infection.  Injury to nearby organs.  Blood clots in the legs, chest, or brain.  Scar tissue on other organs and in the pelvis. This may require another surgery to remove the scar tissue. BEFORE THE PROCEDURE  Ask your health care provider about changing or stopping your regular medicines. Avoid taking aspirin or blood thinners as directed by your health care provider.  Do not  eat or drink anything after midnight on the night before surgery.  If you smoke, do not  smoke for 2 weeks before the surgery.  Do not  drink alcohol the day before the surgery.  Arrange for someone to drive you home after the procedure or after your hospital stay. Also arrange for someone to help you with activities during your recovery. PROCEDURE You will be given medicine to make you sleep through the procedure (general anesthetic). Any of the following methods may be used to perform a  myomectomy:  Small monitors will be put on your body. They are used to check your heart, blood pressure, and oxygen level.  An IV access tube will be put into one of your veins. Medicine will be able to flow directly into your body through this IV tube.  You might be given a medicine to help you relax (sedative).  You will be given a medicine to make you sleep (general anesthetic). A breathing tube will be placed into your lungs during the procedure.  A thin, flexible tube (catheter) will be inserted into your bladder to collect urine.  Any of the following methods may be used to perform a myomectomy:  Hysteroscopic myomectomy This method may be used when the fibroid tumor is inside the cavity of the uterus. A long, thin tube that is like a telescope (hysteroscope) is inserted inside the uterus. A saline solution is put into your uterus. This expands the uterus and allows the surgeon to see the fibroids. Tools are passed through the hysteroscope to remove the fibroid tumor in pieces.  Laparoscopic myomectomy A few small cuts (incisions) are made in the lower abdomen. A thin, lighted tube with a tiny camera on the end (laparoscope) is inserted through one of the incisions. This gives the surgeon a good view of the area. The fibroid tumor is removed through the other incisions. The incisions are then closed with stitches (sutures) or staples.  Abdominal myomectomy This method is used when the fibroid tumor cannot be removed with a hysteroscope or laparoscope. The surgery is performed through a larger surgical incision in   the abdomen. The fibroid tumor is removed through this incision. The incision is closed with sutures or staples. AFTER THE PROCEDURE  If you had a laparoscopic or hysteroscopic myomectomy, you may be able to go home the same day, or you may need to stay in the hospital overnight.  If you had an abdominal myomectomy, you may need to stay in the hospital for a few days.  Your IV  access tube and catheter will be removed in 1 2 days.  You may be given medicine for pain or to help you sleep.  You may be given an antibiotic medicine, if needed. Document Released: 01/02/2007 Document Revised: 12/26/2012 Document Reviewed: 10/17/2012 ExitCare Patient Information 2014 [employer].  

## 2013-08-04 NOTE — Progress Notes (Signed)
Patient ID: Kristina Neal, female   DOB: June 05, 1975, 38 y.o.   MRN: [id number]  Chief Complaint  Patient presents with  . Follow-up    Colposcopy results and follow up to emergency room visit.     HPI Kristina Neal is a 38 y.o. female.  Recent ECC negative.  She [facility] to the [facility] with lower abdominal pain.  A pelvic U/S showed a 5 cm, pedunculated fibroid.  She is s/p a robotic-assisted myomectomy 1 yr ago.  HPI  Past Medical History  Diagnosis Date  . Diabetes mellitus without complication   . Asthma     Past Surgical History  Procedure Laterality Date  . Cervical biopsy  w/ loop electrode excision    . Whipple procedure    . Cervical tumor removed    . Dilation and curettage of uterus  2012    Family History  Problem Relation Age of Onset  . Hypertension Mother   . Diabetes Father     Social History History  Substance Use Topics  . Smoking status: Former Smoker    Types: Cigarettes  . Smokeless tobacco: Never Used  . Alcohol Use: Yes     Comment: occaisionally    Allergies  Allergen Reactions  . Sulfa Antibiotics     *just causes headache*  . Amoxicillin Hives and Rash      Review of Systems Review of Systems Constitutional: negative for fatigue and weight loss Respiratory: negative for cough and wheezing Cardiovascular: negative for chest pain, fatigue and palpitations Gastrointestinal: positive for abdominal pain Genitourinary:negative for vaginal discharge Integument/breast: negative for nipple discharge Musculoskeletal:negative for myalgias Neurological: negative for gait problems and tremors Behavioral/Psych: negative for abusive relationship, depression Endocrine: negative for temperature intolerance     Blood pressure 97/62, pulse 63, temperature 97.6 F (36.4 C), height 5\' 10"  (1.778 m), weight 82.101 kg (181 lb), last menstrual period 07/18/2013.  Physical Exam Physical Exam General:   alert  Abdomen:   normal findings: no organomegaly, soft, non-tender and no hernia     Data Reviewed Pelvic U/S, pathology  Assessment    LSIL Pap smear with a normal colposcopic exam Pedunculated fibroid w/pain--ddx solid, ovarian mass    Plan   Repeat Pap w/an HPV co-test in 1 yr A robotic-assisted myomectomy is planned--risks/benefits reviewed.  Questions answered to stated satisfaction Orders Placed This Encounter  Procedures  . CBC    Follow up postoperatively         Lahoma Crocker 08/04/2013, 2:29 PM

## 2013-08-07 ENCOUNTER — Ambulatory Visit: Payer: BC Managed Care – PPO | Admitting: Obstetrics & Gynecology

## 2013-08-08 ENCOUNTER — Encounter (HOSPITAL_COMMUNITY): Payer: Self-pay | Admitting: Pharmacy Technician

## 2013-08-14 NOTE — Patient Instructions (Signed)
Kristina Neal  08/14/2013   Your procedure is scheduled on:  08/23/2013  0730am-102pm  Report to [facility].  Follow the Signs to [place] at     0530       am  Call this number if you have problems the morning of surgery: [phone]   Remember:   Do not eat food or drink liquids after midnight.   Take these medicines the morning of surgery with A SIP OF WATER:    Do not wear jewelry, make-up or nail polish.  Do not wear lotions, powders, or perfumes.   Do not shave 48 hours prior to surgery.   Do not bring valuables to the hospital.  Contacts, dentures or bridgework may not be worn into surgery.  Leave suitcase in the car. After surgery it may be brought to your room.  For patients admitted to the hospital, checkout time is 11:00 AM the day of  discharge.        Please read over the following fact sheets that you were given: [facility] - Preparing for Surgery Before surgery, you can play an important role.  Because skin is not sterile, your skin needs to be as free of germs as possible.  You can reduce the number of germs on your skin by washing with CHG (chlorahexidine gluconate) soap before surgery.  CHG is an antiseptic cleaner which kills germs and bonds with the skin to continue killing germs even after washing. Please DO NOT use if you have an allergy to CHG or antibacterial soaps.  If your skin becomes reddened/irritated stop using the CHG and inform your nurse when you arrive at Short Stay. Do not shave (including legs and underarms) for at least 48 hours prior to the first CHG shower.  You may shave your face/neck. Please follow these instructions carefully:  1.  Shower with CHG Soap the night before surgery and the  morning of Surgery.  2.  If you choose to wash your hair, wash your hair first as usual with your  normal  shampoo.  3.  After you shampoo, rinse your hair and body thoroughly to remove the  shampoo.                           4.   Use CHG as you would any other liquid soap.  You can apply chg directly  to the skin and wash                       Gently with a scrungie or clean washcloth.  5.  Apply the CHG Soap to your body ONLY FROM THE NECK DOWN.   Do not use on face/ open                           Wound or open sores. Avoid contact with eyes, ears mouth and genitals (private parts).                       Wash face,  Genitals (private parts) with your normal soap.             6.  Wash thoroughly, paying special attention to the area where your surgery  will be performed.  7.  Thoroughly rinse your body with warm water from the neck down.  8.  DO NOT shower/wash with your  normal soap after using and rinsing off  the CHG Soap.                9.  Pat yourself dry with a clean towel.            10.  Wear clean pajamas.            11.  Place clean sheets on your bed the night of your first shower and do not  sleep with pets. Day of Surgery : Do not apply any lotions/deodorants the morning of surgery.  Please wear clean clothes to the hospital/surgery center.  FAILURE TO FOLLOW THESE INSTRUCTIONS MAY RESULT IN THE CANCELLATION OF YOUR SURGERY PATIENT SIGNATURE_________________________________  NURSE SIGNATURE__________________________________  ________________________________________________________________________ , coughing and deep breathing exercises, leg exercises

## 2013-08-15 ENCOUNTER — Encounter (HOSPITAL_COMMUNITY): Payer: Self-pay

## 2013-08-15 ENCOUNTER — Encounter (HOSPITAL_COMMUNITY)
Admission: RE | Admit: 2013-08-15 | Discharge: 2013-08-15 | Disposition: A | Discharge status: Home / Self Care | Payer: BC Managed Care – PPO | Source: Ambulatory Visit | Attending: Obstetrics & Gynecology | Admitting: Obstetrics & Gynecology

## 2013-08-15 DIAGNOSIS — Z01810 Encounter for preprocedural cardiovascular examination: Secondary | ICD-10-CM | POA: Insufficient documentation

## 2013-08-15 DIAGNOSIS — Z01812 Encounter for preprocedural laboratory examination: Secondary | ICD-10-CM | POA: Insufficient documentation

## 2013-08-15 HISTORY — DX: Gastro-esophageal reflux disease without esophagitis: K21.9

## 2013-08-15 HISTORY — DX: Anemia, unspecified: D64.9

## 2013-08-15 LAB — BASIC METABOLIC PANEL
BUN: 6 mg/dL (ref 6–23)
CALCIUM: 8.9 mg/dL (ref 8.4–10.5)
CHLORIDE: 102 meq/L (ref 96–112)
CO2: 28 meq/L (ref 19–32)
CREATININE: 0.53 mg/dL (ref 0.50–1.10)
GFR calc Af Amer: >90 mL/min (ref >90)
GFR calc non Af Amer: >90 mL/min (ref >90)
Glucose, Bld: 135 mg/dL — ABNORMAL HIGH (ref 70–99)
Potassium: 4.8 mEq/L (ref 3.7–5.3)
SODIUM: 140 meq/L (ref 137–147)

## 2013-08-15 LAB — CBC
HCT: 37.2 % (ref 36.0–46.0)
Hemoglobin: 11.8 g/dL — ABNORMAL LOW (ref 12.0–15.0)
MCH: 28 pg (ref 26.0–34.0)
MCHC: 31.7 g/dL (ref 30.0–36.0)
MCV: 88.2 fL (ref 78.0–100.0)
Platelets: 212 10*3/uL (ref 150–400)
RBC: 4.22 MIL/uL (ref 3.87–5.11)
RDW: 13.1 % (ref 11.5–15.5)
WBC: 4.6 10*3/uL (ref 4.0–10.5)

## 2013-08-15 LAB — PREGNANCY, URINE: Preg Test, Ur: NEGATIVE

## 2013-08-21 NOTE — H&P (Signed)
  Subjective:    Patient is a 38 y.o. female scheduled for a robotic-assisted myomectomy. The patient has been diagnosed with a pedunculated myoma.  Onset of symptoms was 1 monthago. Symptoms have been unchanged since that time. The pain is located in the lower abdomen. Patient describes the pain as intermittent and rated as moderate. Past history includes a possible diagnosis of uterine fibroids/robotic-assisted myomectomy. A recent U/S showed a 5 cm, pedunculated fibroid.  Pertinent Gynecological History:  Last pap: abnormal: LSIL Date: 1/15  Discussed Blood/Blood Products: yes  Menstrual History: OB History   Grav Para Term Preterm Abortions TAB SAB Ect Mult Living   4    4  4           Patient's last menstrual period was 07/18/2013.    The following portions of the patient's history were reviewed and updated as appropriate: allergies, current medications, past family history, past medical history, past social history, past surgical history and problem list.  Review of Systems Pertinent items are noted in HPI.    Objective:    LMP 07/18/2013     General:   alert  Skin:   no rash or abnormalities  Lungs:   clear to auscultation bilaterally  Heart:   regular rate and rhythm, S1, S2 normal, no murmur, click, rub or gallop  Breasts:   normal without suspicious masses, skin or nipple changes or axillary nodes  Abdomen:  normal findings: no organomegaly, soft, non-tender and no hernia  Pelvis:  External genitalia: normal general appearance Urinary system: urethral meatus normal and bladder without fullness, nontender Vaginal: normal without tenderness, induration or masses Cervix: normal appearance Adnexa: normal bimanual exam Uterus: anteverted, mildly tender, upper limits of normal size    Assessment:    Pedunculated, subserous myoma--ddx includes adnexal pathology ?Torsion--intermittent   Plan:  A robotic-assisted myomectomy is planned  Counseling: Procedure,  risks, reasons, benefits and complications (including injury to bowel, bladder, major blood vessel, ureter, bleeding, possibility of transfusion, infection, or fistula formation) thromboembolic event reviewed in detail. Consent signed. Preop testing ordered. Instructions reviewed, including NPO after midnight.

## 2013-08-23 ENCOUNTER — Encounter (HOSPITAL_COMMUNITY): Payer: BC Managed Care – PPO | Admitting: Certified Registered Nurse Anesthetist

## 2013-08-23 ENCOUNTER — Encounter (HOSPITAL_COMMUNITY)
Admission: RE | Disposition: A | Discharge status: Home / Self Care | Payer: Self-pay | Source: Ambulatory Visit | Attending: Obstetrics & Gynecology

## 2013-08-23 ENCOUNTER — Encounter (HOSPITAL_COMMUNITY): Payer: Self-pay | Admitting: *Deleted

## 2013-08-23 ENCOUNTER — Ambulatory Visit (HOSPITAL_COMMUNITY)
Admission: RE | Admit: 2013-08-23 | Discharge: 2013-08-23 | Disposition: A | Discharge status: Home / Self Care | Payer: BC Managed Care – PPO | Source: Ambulatory Visit | Attending: Obstetrics & Gynecology | Admitting: Obstetrics & Gynecology

## 2013-08-23 ENCOUNTER — Ambulatory Visit (HOSPITAL_COMMUNITY): Payer: BC Managed Care – PPO | Admitting: Certified Registered Nurse Anesthetist

## 2013-08-23 DIAGNOSIS — D259 Leiomyoma of uterus, unspecified: Secondary | ICD-10-CM | POA: Insufficient documentation

## 2013-08-23 DIAGNOSIS — E119 Type 2 diabetes mellitus without complications: Secondary | ICD-10-CM | POA: Insufficient documentation

## 2013-08-23 DIAGNOSIS — Z794 Long term (current) use of insulin: Secondary | ICD-10-CM | POA: Insufficient documentation

## 2013-08-23 DIAGNOSIS — K219 Gastro-esophageal reflux disease without esophagitis: Secondary | ICD-10-CM | POA: Insufficient documentation

## 2013-08-23 DIAGNOSIS — Z79899 Other long term (current) drug therapy: Secondary | ICD-10-CM | POA: Insufficient documentation

## 2013-08-23 DIAGNOSIS — N949 Unspecified condition associated with female genital organs and menstrual cycle: Secondary | ICD-10-CM

## 2013-08-23 DIAGNOSIS — R109 Unspecified abdominal pain: Secondary | ICD-10-CM | POA: Insufficient documentation

## 2013-08-23 DIAGNOSIS — D252 Subserosal leiomyoma of uterus: Secondary | ICD-10-CM

## 2013-08-23 DIAGNOSIS — J45909 Unspecified asthma, uncomplicated: Secondary | ICD-10-CM | POA: Insufficient documentation

## 2013-08-23 DIAGNOSIS — Z87891 Personal history of nicotine dependence: Secondary | ICD-10-CM | POA: Insufficient documentation

## 2013-08-23 HISTORY — PX: ROBOT ASSISTED MYOMECTOMY: SHX5142

## 2013-08-23 LAB — GLUCOSE, CAPILLARY
GLUCOSE-CAPILLARY: 104 mg/dL — AB (ref 70–99)
GLUCOSE-CAPILLARY: 158 mg/dL — AB (ref 70–99)
Glucose-Capillary: 125 mg/dL — ABNORMAL HIGH (ref 70–99)

## 2013-08-23 SURGERY — ROBOTIC ASSISTED MYOMECTOMY
Anesthesia: General

## 2013-08-23 MED ORDER — BUPIVACAINE HCL (PF) 0.25 % IJ SOLN
INTRAMUSCULAR | Status: AC
  Filled 2013-08-23: qty 30

## 2013-08-23 MED ORDER — PROPOFOL 10 MG/ML IV BOLUS
INTRAVENOUS | Status: AC
  Filled 2013-08-23: qty 20

## 2013-08-23 MED ORDER — METOCLOPRAMIDE HCL 5 MG/ML IJ SOLN
INTRAMUSCULAR | Status: AC
  Filled 2013-08-23: qty 2

## 2013-08-23 MED ORDER — LACTATED RINGERS IV SOLN
INTRAVENOUS | Status: DC
  Administered 2013-08-23: 13:00:00 via INTRAVENOUS

## 2013-08-23 MED ORDER — VASOPRESSIN 20 UNIT/ML IJ SOLN: INTRAMUSCULAR | Status: DC | PRN

## 2013-08-23 MED ORDER — LIDOCAINE HCL (CARDIAC) 20 MG/ML IV SOLN
INTRAVENOUS | Status: AC
  Filled 2013-08-23: qty 5

## 2013-08-23 MED ORDER — SODIUM CHLORIDE 0.9 % IJ SOLN: 3.0000 mL | INTRAMUSCULAR | Status: DC | PRN

## 2013-08-23 MED ORDER — VASOPRESSIN 20 UNIT/ML IJ SOLN
Freq: Once | Status: AC
  Administered 2013-08-23: 10:00:00
  Filled 2013-08-23: qty 60

## 2013-08-23 MED ORDER — SODIUM CHLORIDE 0.9 % IV SOLN: 250.0000 mL | INTRAVENOUS | Status: DC | PRN

## 2013-08-23 MED ORDER — HYDROMORPHONE HCL PF 2 MG/ML IJ SOLN
INTRAMUSCULAR | Status: AC
  Filled 2013-08-23: qty 1

## 2013-08-23 MED ORDER — ONDANSETRON HCL 4 MG/2ML IJ SOLN
INTRAMUSCULAR | Status: AC
  Filled 2013-08-23: qty 2

## 2013-08-23 MED ORDER — DEXAMETHASONE SODIUM PHOSPHATE 10 MG/ML IJ SOLN
INTRAMUSCULAR | Status: DC | PRN
  Administered 2013-08-23: 10 mg via INTRAVENOUS

## 2013-08-23 MED ORDER — ONDANSETRON HCL 4 MG/2ML IJ SOLN
INTRAMUSCULAR | Status: DC | PRN
  Administered 2013-08-23: 4 mg via INTRAVENOUS

## 2013-08-23 MED ORDER — HYDROMORPHONE HCL PF 1 MG/ML IJ SOLN
0.2500 mg | INTRAMUSCULAR | Status: DC | PRN
  Administered 2013-08-23 (×3): 0.5 mg via INTRAVENOUS
  Administered 2013-08-23 (×2): 0.25 mg via INTRAVENOUS

## 2013-08-23 MED ORDER — INSULIN ASPART 100 UNIT/ML ~~LOC~~ SOLN: 0.0000 [IU] | SUBCUTANEOUS | Status: DC

## 2013-08-23 MED ORDER — LACTATED RINGERS IV SOLN
INTRAVENOUS | Status: DC | PRN
  Administered 2013-08-23 (×2): via INTRAVENOUS

## 2013-08-23 MED ORDER — ACETAMINOPHEN 325 MG PO TABS: 650.0000 mg | ORAL_TABLET | ORAL | Status: DC | PRN

## 2013-08-23 MED ORDER — MIDAZOLAM HCL 5 MG/5ML IJ SOLN
INTRAMUSCULAR | Status: DC | PRN
  Administered 2013-08-23: 2 mg via INTRAVENOUS

## 2013-08-23 MED ORDER — LACTATED RINGERS IR SOLN
Status: DC | PRN
  Administered 2013-08-23: 1000 mL

## 2013-08-23 MED ORDER — ACETAMINOPHEN 650 MG RE SUPP
650.0000 mg | RECTAL | Status: DC | PRN
  Filled 2013-08-23: qty 1

## 2013-08-23 MED ORDER — LIDOCAINE HCL (CARDIAC) 20 MG/ML IV SOLN
INTRAVENOUS | Status: DC | PRN
  Administered 2013-08-23: 80 mg via INTRAVENOUS

## 2013-08-23 MED ORDER — METOCLOPRAMIDE HCL 5 MG/ML IJ SOLN
INTRAMUSCULAR | Status: DC | PRN
  Administered 2013-08-23: 10 mg via INTRAVENOUS

## 2013-08-23 MED ORDER — ALBUTEROL SULFATE (2.5 MG/3ML) 0.083% IN NEBU: 2.5000 mg | INHALATION_SOLUTION | Freq: Four times a day (QID) | RESPIRATORY_TRACT | Status: DC | PRN

## 2013-08-23 MED ORDER — FENTANYL CITRATE 0.05 MG/ML IJ SOLN
INTRAMUSCULAR | Status: AC
  Filled 2013-08-23: qty 5

## 2013-08-23 MED ORDER — OXYCODONE HCL 5 MG PO TABS: 5.0000 mg | ORAL_TABLET | ORAL | Status: DC | PRN

## 2013-08-23 MED ORDER — HYDROMORPHONE HCL PF 1 MG/ML IJ SOLN
INTRAMUSCULAR | Status: AC
  Filled 2013-08-23: qty 1

## 2013-08-23 MED ORDER — SODIUM CHLORIDE 0.9 % IJ SOLN: 3.0000 mL | Freq: Two times a day (BID) | INTRAMUSCULAR | Status: DC

## 2013-08-23 MED ORDER — ROCURONIUM BROMIDE 100 MG/10ML IV SOLN
INTRAVENOUS | Status: AC
  Filled 2013-08-23: qty 1

## 2013-08-23 MED ORDER — NEOSTIGMINE METHYLSULFATE 10 MG/10ML IV SOLN
INTRAVENOUS | Status: DC | PRN
  Administered 2013-08-23: 5 mg via INTRAVENOUS

## 2013-08-23 MED ORDER — LACTATED RINGERS IV SOLN: INTRAVENOUS | Status: DC | PRN

## 2013-08-23 MED ORDER — FENTANYL CITRATE 0.05 MG/ML IJ SOLN
INTRAMUSCULAR | Status: DC | PRN
  Administered 2013-08-23: 100 ug via INTRAVENOUS
  Administered 2013-08-23: 50 ug via INTRAVENOUS
  Administered 2013-08-23: 100 ug via INTRAVENOUS

## 2013-08-23 MED ORDER — OXYCODONE HCL 5 MG PO TABS
5.0000 mg | ORAL_TABLET | ORAL | Status: DC | PRN
  Administered 2013-08-23 (×2): 5 mg via ORAL
  Filled 2013-08-23 (×2): qty 1

## 2013-08-23 MED ORDER — MIDAZOLAM HCL 2 MG/2ML IJ SOLN
INTRAMUSCULAR | Status: AC
  Filled 2013-08-23: qty 2

## 2013-08-23 MED ORDER — HYDROMORPHONE HCL PF 1 MG/ML IJ SOLN
INTRAMUSCULAR | Status: DC | PRN
Start: 2013-08-23 — End: 2013-08-23
  Administered 2013-08-23 (×2): 1 mg via INTRAVENOUS

## 2013-08-23 MED ORDER — OXYCODONE-ACETAMINOPHEN 10-325 MG PO TABS: 1.0000 | ORAL_TABLET | ORAL | Status: DC | PRN

## 2013-08-23 MED ORDER — PROPOFOL 10 MG/ML IV BOLUS
INTRAVENOUS | Status: DC | PRN
  Administered 2013-08-23: 170 mg via INTRAVENOUS

## 2013-08-23 MED ORDER — ACETAMINOPHEN 650 MG RE SUPP: 650.0000 mg | RECTAL | Status: DC | PRN

## 2013-08-23 MED ORDER — GLYCOPYRROLATE 0.2 MG/ML IJ SOLN
INTRAMUSCULAR | Status: DC | PRN
  Administered 2013-08-23: 0.6 mg via INTRAVENOUS

## 2013-08-23 MED ORDER — ROCURONIUM BROMIDE 100 MG/10ML IV SOLN
INTRAVENOUS | Status: DC | PRN
  Administered 2013-08-23: 10 mg via INTRAVENOUS
  Administered 2013-08-23: 50 mg via INTRAVENOUS
  Administered 2013-08-23: 10 mg via INTRAVENOUS

## 2013-08-23 SURGICAL SUPPLY — 59 items
BAG DECANTER FOR FLEXI CONT (MISCELLANEOUS) ×2 IMPLANT
BARRIER ADHS 3X4 INTERCEED (GAUZE/BANDAGES/DRESSINGS) ×4 IMPLANT
CONT SPEC 4OZ CLIKSEAL STRL BL (MISCELLANEOUS) ×2 IMPLANT
CORDS BIPOLAR (ELECTRODE) ×2 IMPLANT
COVER MAYO STAND STRL (DRAPES) IMPLANT
COVER SURGICAL LIGHT HANDLE (MISCELLANEOUS) ×2 IMPLANT
COVER TIP SHEARS 8 DVNC (MISCELLANEOUS) ×1 IMPLANT
COVER TIP SHEARS 8MM DA VINCI (MISCELLANEOUS) ×1
DECANTER SPIKE VIAL GLASS SM (MISCELLANEOUS) ×2 IMPLANT
DERMABOND ADVANCED (GAUZE/BANDAGES/DRESSINGS) ×2
DERMABOND ADVANCED .7 DNX12 (GAUZE/BANDAGES/DRESSINGS) ×2 IMPLANT
DRAPE LG THREE QUARTER DISP (DRAPES) ×4 IMPLANT
DRAPE SURG IRRIG POUCH 19X23 (DRAPES) ×2 IMPLANT
DRAPE TABLE BACK 44X90 PK DISP (DRAPES) ×2 IMPLANT
DRAPE WARM FLUID 44X44 (DRAPE) ×2 IMPLANT
ELECT REM PT RETURN 9FT ADLT (ELECTROSURGICAL) ×2
ELECTRODE REM PT RTRN 9FT ADLT (ELECTROSURGICAL) ×1 IMPLANT
FILTER SMOKE EVAC LAPAROSHD (FILTER) IMPLANT
GLOVE BIO SURGEON STRL SZ 6.5 (GLOVE) ×6 IMPLANT
GLOVE SS BIOGEL STRL SZ 8 (GLOVE) IMPLANT
GLOVE SUPERSENSE BIOGEL SZ 8 (GLOVE)
GOWN STRL REUS W/TWL LRG LVL3 (GOWN DISPOSABLE) ×8 IMPLANT
IV LACTATED RINGER IRRG 3000ML (IV SOLUTION) ×1
IV LR IRRIG 3000ML ARTHROMATIC (IV SOLUTION) ×1 IMPLANT
KIT ACCESSORY DA VINCI DISP (KITS) ×1
KIT ACCESSORY DVNC DISP (KITS) ×1 IMPLANT
KIT BASIN OR (CUSTOM PROCEDURE TRAY) ×2 IMPLANT
LUBRICANT JELLY K Y 4OZ (MISCELLANEOUS) ×2 IMPLANT
MANIPULATOR UTERINE 4.5 ZUMI (MISCELLANEOUS) ×2 IMPLANT
NEEDLE SPNL 22GX7 SPINOC (NEEDLE) ×2 IMPLANT
OCCLUDER COLPOPNEUMO (BALLOONS) ×2 IMPLANT
PACK GENERAL/GYN (CUSTOM PROCEDURE TRAY) ×2 IMPLANT
PENCIL BUTTON HOLSTER BLD 10FT (ELECTRODE) ×2 IMPLANT
POSITIONER SURGICAL ARM (MISCELLANEOUS) IMPLANT
SET TUBE IRRIG SUCTION NO TIP (IRRIGATION / IRRIGATOR) ×2 IMPLANT
SHEET LAVH (DRAPES) ×2 IMPLANT
SOLUTION ELECTROLUBE (MISCELLANEOUS) ×2 IMPLANT
SPONGE GAUZE 4X4 12PLY (GAUZE/BANDAGES/DRESSINGS) ×2 IMPLANT
SUT MNCRL AB 4-0 PS2 18 (SUTURE) ×4 IMPLANT
SUT MON AB 3-0 SH 27 (SUTURE) ×2
SUT MON AB 3-0 SH27 (SUTURE) ×2 IMPLANT
SUT V-LOC BARB 180 2/0GR6 GS22 (SUTURE) ×2
SUT VIC AB 0 CT1 27 (SUTURE)
SUT VIC AB 0 CT1 27XBRD ANTBC (SUTURE) IMPLANT
SUT VIC AB 0 CT2 27 (SUTURE) IMPLANT
SUT VIC AB 2-0 CT2 27 (SUTURE) IMPLANT
SUT VICRYL 0 UR6 27IN ABS (SUTURE) ×2 IMPLANT
SUTURE V-LC BRB 180 2/0GR6GS22 (SUTURE) ×1 IMPLANT
SYR 20CC LL (SYRINGE) ×2 IMPLANT
SYR 50ML LL SCALE MARK (SYRINGE) ×2 IMPLANT
SYR BULB IRRIGATION 50ML (SYRINGE) IMPLANT
TOWEL OR 17X26 10 PK STRL BLUE (TOWEL DISPOSABLE) ×4 IMPLANT
TRAY FOLEY CATH 14FRSI W/METER (CATHETERS) ×2 IMPLANT
TROCAR 12M 150ML BLUNT (TROCAR) ×2 IMPLANT
TROCAR BLADELESS OPT 5 75 (ENDOMECHANICALS) ×2 IMPLANT
TROCAR XCEL 12X100 BLDLESS (ENDOMECHANICALS) ×2 IMPLANT
TUBING FILTER THERMOFLATOR (ELECTROSURGICAL) IMPLANT
TUBING INSUFFLATION 10FT LAP (TUBING) ×2 IMPLANT
WATER STERILE IRR 1500ML POUR (IV SOLUTION) ×4 IMPLANT

## 2013-08-23 NOTE — Interval H&P Note (Signed)
History and Physical Interval Note:  08/23/2013 7:24 AM  Kristina Neal  has presented today for surgery, with the diagnosis of Pedunculated myoma [phone]  The various methods of treatment have been discussed with the patient and family. After consideration of risks, benefits and other options for treatment, the patient has consented to  Procedure(s): ROBOTIC ASSISTED MYOMECTOMY (N/A) as a surgical intervention .  The patient's history has been reviewed, patient examined, no change in status, stable for surgery.  I have reviewed the patient's chart and labs.  Questions were answered to the patient's satisfaction.     Lahoma Crocker

## 2013-08-23 NOTE — Progress Notes (Signed)
Up and walked in hall. Tol well 

## 2013-08-23 NOTE — Anesthesia Postprocedure Evaluation (Signed)
  Anesthesia Post-op Note  Patient: Kristina Neal  Procedure(s) Performed: Procedure(s) (LRB): ROBOTIC ASSISTED MYOMECTOMY (N/A)  Patient Location: PACU  Anesthesia Type: General  Level of Consciousness: awake and alert   Airway and Oxygen Therapy: Patient Spontanous Breathing  Post-op Pain: mild  Post-op Assessment: Post-op Vital signs reviewed, Patient's Cardiovascular Status Stable, Respiratory Function Stable, Patent Airway and No signs of Nausea or vomiting  Last Vitals:  Filed Vitals:   08/23/13 1115  BP: 108/71  Pulse: 62  Temp:   Resp: 8    Post-op Vital Signs: stable   Complications: No apparent anesthesia complications

## 2013-08-23 NOTE — Op Note (Addendum)
Pre-operative Diagnosis: adnexal mass and pelvic pain  Post-operative Diagnosis: same  Operation: Robotic-assisted myomectomies  Surgeon: Lahoma Crocker  Assistant: Baltazar Najjar, MD  Anesthesia: GET  Urine Output: per Anesthesiology  Findings: Left fundal 5 cm pedunculated myoma. Multiple subserosal, posterior myomas < 2 cm in diameter  Estimated Blood Loss:  Minimal                 Total IV Fluids: per Anesthesiolgy         Specimens: PATHOLOGY               Complications:  None; patient tolerated the procedure well.         Disposition: PACU - hemodynamically stable.         Condition: Stable    Procedure Details  The patient was seen in the Holding Room. The risks, benefits, complications, treatment options, and expected outcomes were discussed with the patient.  The patient concurred with the proposed plan, giving informed consent.  The site of surgery properly noted/marked. The patient was identified as Kristina Neal and the procedure verified as a Robotic-assisted myomectomy. A Time Out was held and the above information confirmed.  After induction of anesthesia, the patient was draped and prepped in the usual sterile manner. Pt was placed in supine position after anesthesia and draped and prepped in the usual sterile manner. The abdominal drape was placed after the CholoraPrep had been allowed to dry for 3 minutes.  Her arms were tucked to her side with all appropriate precautions.  The chest was taped in the usual fashion.  The patient was placed in the semi-lithotomy position in [place].  The perineum was prepped with Betadine.  Foley catheter was placed.  A sterile speculum was placed in the vagina.  The cervix was grasped with a single-tooth tenaculum and dilated with Kennon Rounds dilators.  The ZUMI uterine manipulator was placed without difficulty.   A 5 mm incision was made in the umbilicus.   and using a 5 mm Optiview, a 5 mm trocar was placed  under direct visionApproximately 2 cm below the costal margin, in the midclavicular line the skin was anesthestized with 0.25% Marcaine.  A 5 mm incision was made and using a 5 mm Optiview, a 5 mm trocar was placed under direct vision. The patient's abdomen was insufflated with CO2 gas.   At this point and all points during the procedure, the patient's intra-abdominal pressure did not exceed 15 mmHg.   Bilateral 8 mm ports were place 10 cm and 15 degrees inferior to the port at the umbilicus.   The 5 mm port at the umbilicus was removed.  The incision was extended to accommodate a 10 mm trocar.  A 10 mm trocar and sleeve were advanced under direct visualization at Palmer's point.  The robot was docked in the usual fashion.  The serosa overlying the stalk of the pedunculated myoma was infiltrated with a dilute Pitressin solution.  The serosa overlying the other myomas was infiltrated with Pitressin.  The serosa overlying the stalk of the pedunculated myoma incised with monopolar cutting current and the stalk was transected.  The remaining myomas removed by incising the serosa down to the pseudocapsule with monopolar cutting current.  The using blunt dissection, myomas were enucleated. The small myomas were removed from abdomen through the assistant port.  The two larger defects in uterine serosa were closed in a single layer with a running suture of 2-0 vicryl V-lock suture.  The superficial serosal defects were closed with interrupted sutures of 3-0 Monocryl.  The large myoma was placed in an endocatch bag.  The pelvis was irrigated.  The incisions were hemostatic. The robot was undocked.  The skin/fascia of the umbilical port was extended to 20 mm.  The bag was then brought up to the abdominal wall.  The myoma was morcellated with a cold knife and removed from the abdomen.  The ports were removed.  The fascia at the umbilicus was closed with a running suture of 0-Vicryl..Deep, subcutaneous, figure-of-eight 0-Vicryl  sutures on a UR-6 needle were placed in the 10-12 mm  infracostal incision.  All skin incisions were closed in a subcuticular fashion using 3-0 Monocryl.  A skin adhesive was applied.  The manipulator was removed..   All instrument and needle counts were correct x  2.

## 2013-08-23 NOTE — Anesthesia Preprocedure Evaluation (Addendum)
Anesthesia Evaluation  Patient identified by MRN, date of birth, ID band Patient awake    Reviewed: Allergy & Precautions, H&P , NPO status , Patient's Chart, lab work & pertinent test results  Airway Mallampati: II TM Distance: >3 FB Neck ROM: full    Dental no notable dental hx. (+) Teeth Intact, Dental Advisory Given   Pulmonary asthma , former smoker,  breath sounds clear to auscultation  Pulmonary exam normal       Cardiovascular Exercise Tolerance: Good negative cardio ROS  Rhythm:regular Rate:Normal     Neuro/Psych negative neurological ROS  negative psych ROS   GI/Hepatic negative GI ROS, Neg liver ROS, GERD-  Medicated and Controlled,  Endo/Other  diabetes, Well Controlled, Type 2, Insulin Dependent, Oral Hypoglycemic Agents  Renal/GU negative Renal ROS  negative genitourinary   Musculoskeletal   Abdominal   Peds  Hematology negative hematology ROS (+)   Anesthesia Other Findings   Reproductive/Obstetrics negative OB ROS                          Anesthesia Physical Anesthesia Plan  ASA: III  Anesthesia Plan: General   Post-op Pain Management:    Induction: Intravenous  Airway Management Planned: Oral ETT  Additional Equipment:   Intra-op Plan:   Post-operative Plan: Extubation in OR  Informed Consent: I have reviewed the patients History and Physical, chart, labs and discussed the procedure including the risks, benefits and alternatives for the proposed anesthesia with the patient or authorized representative who has indicated his/her understanding and acceptance.   Dental Advisory Given  Plan Discussed with: CRNA and Surgeon  Anesthesia Plan Comments:         Anesthesia Quick Evaluation

## 2013-08-23 NOTE — Discharge Instructions (Signed)
Myomectomy, Care After Refer to this sheet in the next few weeks. These instructions provide you with information on caring for yourself after your procedure. Your health care provider may also give you more specific instructions. Your treatment has been planned according to current medical practices, but problems sometimes occur. Call your health care provider if you have any problems or questions after your procedure. WHAT TO EXPECT AFTER THE PROCEDURE After your procedure, it is typical to have the following:  Pain in your abdomen, especially at any incision sites. You will be given pain medicine to control the pain.  Tiredness. This is a normal part of the recovery process. Your energy level will return to normal over the next several weeks.  Constipation.  Vaginal bleeding. This is normal and should stop after 1 2 weeks. HOME CARE INSTRUCTIONS   Only take over-the-counter or prescription medicines as directed by your health care provider. Avoid aspirin because it can cause bleeding.  Do not douche, use tampons, or have sexual intercourse until given permission by your health care provider.  Remove or change any bandages (dressings) as directed by your health care provider.  Take showers instead of baths as directed by your health care provider.  You will probably be able to go back to your normal routine after a few days. Do not do anything that requires extra effort until your health care provider says it is okay. Do not lift anything heavier than 15 pounds (6.8 kg) until your health care provider approves.  Walk daily but take frequent rest breaks if you tire easily.  Continue to practice deep breathing and coughing. If it hurts to cough, try holding a pillow against your belly as you cough.  If you become constipated, you may:  Use a mild laxative if your health care provider approves.  Add more fruit and bran to your diet.  Drink enough fluids to keep your urine clear or  pale yellow.  Take your temperature twice a day and write it down.  Do not drink alcohol.  Do not drive until your health care provider approves.  Have someone help you at home for 1 week or until you can do your own household activities.  Follow up with your health care provider as directed. SEEK MEDICAL CARE IF:  You have a fever.  You have increasing abdominal pain that is not relieved with medicine.  You have nausea, vomiting, or diarrhea.  You have pain when you urinate, or you have blood in your urine.  You have a rash on your body.  You have pain or redness where your IV access tube was inserted.  You have redness, swelling, or any kind of drainage from an incision. SEEK IMMEDIATE MEDICAL CARE IF:   You have weakness or lightheadedness.  You have pain, swelling, or redness in your legs.  You have chest pain.  You faint.  You have shortness of breath.  You have heavy vaginal bleeding.  Your incision is opening up. Document Released: 07/28/2010 Document Revised: 12/26/2012 Document Reviewed: 10/17/2012 [facility] Patient Information 2014 [place].

## 2013-08-23 NOTE — Transfer of Care (Signed)
Immediate Anesthesia Transfer of Care Note  Patient: Kristina Neal  Procedure(s) Performed: Procedure(s): ROBOTIC ASSISTED MYOMECTOMY (N/A)  Patient Location: PACU  Anesthesia Type:General  Level of Consciousness: awake and alert   Airway & Oxygen Therapy: Patient Spontanous Breathing and Patient connected to face mask oxygen  Post-op Assessment: Report given to PACU RN and Post -op Vital signs reviewed and stable  Post vital signs: Reviewed and stable  Complications: No apparent anesthesia complications

## 2013-08-26 ENCOUNTER — Encounter (HOSPITAL_COMMUNITY): Payer: Self-pay | Admitting: Obstetrics & Gynecology

## 2013-08-29 ENCOUNTER — Inpatient Hospital Stay (HOSPITAL_COMMUNITY): Payer: BC Managed Care – PPO

## 2013-08-29 ENCOUNTER — Encounter (HOSPITAL_COMMUNITY): Payer: Self-pay | Admitting: *Deleted

## 2013-08-29 ENCOUNTER — Inpatient Hospital Stay (HOSPITAL_COMMUNITY)
Admission: AD | Admit: 2013-08-29 | Discharge: 2013-08-29 | Disposition: A | Discharge status: Home / Self Care | Payer: BC Managed Care – PPO | Source: Ambulatory Visit | Attending: Obstetrics | Admitting: Obstetrics

## 2013-08-29 DIAGNOSIS — N939 Abnormal uterine and vaginal bleeding, unspecified: Secondary | ICD-10-CM

## 2013-08-29 DIAGNOSIS — K219 Gastro-esophageal reflux disease without esophagitis: Secondary | ICD-10-CM | POA: Insufficient documentation

## 2013-08-29 DIAGNOSIS — IMO0002 Reserved for concepts with insufficient information to code with codable children: Secondary | ICD-10-CM | POA: Insufficient documentation

## 2013-08-29 DIAGNOSIS — E119 Type 2 diabetes mellitus without complications: Secondary | ICD-10-CM | POA: Insufficient documentation

## 2013-08-29 DIAGNOSIS — N83209 Unspecified ovarian cyst, unspecified side: Secondary | ICD-10-CM | POA: Insufficient documentation

## 2013-08-29 DIAGNOSIS — N898 Other specified noninflammatory disorders of vagina: Secondary | ICD-10-CM

## 2013-08-29 DIAGNOSIS — N83202 Unspecified ovarian cyst, left side: Secondary | ICD-10-CM

## 2013-08-29 LAB — URINALYSIS, ROUTINE W REFLEX MICROSCOPIC
Bilirubin Urine: NEGATIVE
Glucose, UA: NEGATIVE mg/dL
Ketones, ur: NEGATIVE mg/dL
LEUKOCYTES UA: NEGATIVE
NITRITE: NEGATIVE
PH: 6 (ref 5.0–8.0)
Protein, ur: NEGATIVE mg/dL
Specific Gravity, Urine: <1.005 — ABNORMAL LOW (ref 1.005–1.030)
Urobilinogen, UA: 0.2 mg/dL (ref 0.0–1.0)

## 2013-08-29 LAB — URINE MICROSCOPIC-ADD ON

## 2013-08-29 LAB — CBC
HCT: 35.5 % — ABNORMAL LOW (ref 36.0–46.0)
Hemoglobin: 11.4 g/dL — ABNORMAL LOW (ref 12.0–15.0)
MCH: 28.3 pg (ref 26.0–34.0)
MCHC: 32.1 g/dL (ref 30.0–36.0)
MCV: 88.1 fL (ref 78.0–100.0)
Platelets: 174 10*3/uL (ref 150–400)
RBC: 4.03 MIL/uL (ref 3.87–5.11)
RDW: 13.3 % (ref 11.5–15.5)
WBC: 7.5 10*3/uL (ref 4.0–10.5)

## 2013-08-29 LAB — POCT PREGNANCY, URINE: Preg Test, Ur: NEGATIVE

## 2013-08-29 MED ORDER — KETOROLAC TROMETHAMINE 60 MG/2ML IM SOLN
60.0000 mg | Freq: Once | INTRAMUSCULAR | Status: AC
  Administered 2013-08-29: 60 mg via INTRAMUSCULAR
  Filled 2013-08-29: qty 2

## 2013-08-29 NOTE — MAU Provider Note (Signed)
History     CSN: [id number]  Arrival date and time: 08/29/13 1712   None     Chief Complaint  Patient presents with  . Vaginal Bleeding   Patient is a 38 y.o. female presenting with vaginal bleeding.  Vaginal Bleeding Associated symptoms include abdominal pain. Pertinent negatives include no chills, fever, nausea or vomiting.  pt is 6 days post op myomectomy by Dr. Delsa Sale on 08/23/2013 for pedunculated fibroid.  The pt states she did well after surgery and pain has been controlled with Percocet. Pt has had minimal bleeding but started bleeding today.  Pt denies chills, fever, constipation, diarrhea, UTI sx.    RN note: Patient presents with complaint of post-operative vaginal bleeding and states she had a myomectomy to remove a tumor on 08/23/13.  Past Medical History  Diagnosis Date  . Diabetes mellitus without complication   . Asthma   . GERD (gastroesophageal reflux disease)   . Anemia     Past Surgical History  Procedure Laterality Date  . Cervical biopsy  w/ loop electrode excision    . Whipple procedure    . Cervical tumor removed    . Dilation and curettage of uterus  2012  . Leep    . Robot assisted myomectomy N/A 08/23/2013    Procedure: ROBOTIC ASSISTED MYOMECTOMY;  Surgeon: Lahoma Crocker, MD;  Location: [facility];  Service: Gynecology;  Laterality: N/A;    Family History  Problem Relation Age of Onset  . Hypertension Mother   . Diabetes Father     History  Substance Use Topics  . Smoking status: Former Smoker    Types: Cigarettes    Quit date: 03/21/2006  . Smokeless tobacco: Never Used  . Alcohol Use: Yes     Comment: occaisionally    Allergies:  Allergies  Allergen Reactions  . Sulfa Antibiotics Other (See Comments)    Causes headache.  . Amoxicillin Hives and Rash    Prescriptions prior to admission  Medication Sig Dispense Refill  . insulin aspart (NOVOLOG) 100 UNIT/ML injection Inject 9 Units into the skin 3 (three) times daily  before meals.      . insulin detemir (LEVEMIR) 100 UNIT/ML injection Inject 9 Units into the skin 2 (two) times daily. Patient administers first thing in the morning and at bedtime.      Marland Kitchen omeprazole (PRILOSEC) 40 MG capsule Take 40 mg by mouth daily as needed (for acid reflux).      Marland Kitchen oxyCODONE-acetaminophen (PERCOCET) 10-325 MG per tablet Take 1 tablet by mouth every 4 (four) hours as needed for pain.  30 tablet  0  . Prenatal Vit-Fe Fumarate-FA (PRENATAL MULTIVITAMIN) TABS tablet Take 1 tablet by mouth daily at 12 noon.      . vitamin C (ASCORBIC ACID) 500 MG tablet Take 500 mg by mouth daily.       Marland Kitchen albuterol (PROVENTIL HFA;VENTOLIN HFA) 108 (90 BASE) MCG/ACT inhaler Inhale 1-2 puffs into the lungs every 6 (six) hours as needed for wheezing or shortness of breath.        Review of Systems  Constitutional: Negative for fever and chills.  Gastrointestinal: Positive for abdominal pain. Negative for nausea, vomiting, diarrhea and constipation.  Genitourinary: Positive for vaginal bleeding. Negative for dysuria and urgency.   Physical Exam   Blood pressure 101/57, pulse 76, temperature 98.8 F (37.1 C), temperature source Oral, resp. rate 16, height 5\' 8"  (1.727 m), weight 184 lb 2 oz (83.519 kg), last menstrual period 07/18/2013.  Physical Exam  Nursing note and vitals reviewed. Constitutional: She is oriented to person, place, and time. She appears well-developed and well-nourished. No distress.  HENT:  Head: Normocephalic.  Eyes: Pupils are equal, round, and reactive to light.  Neck: Normal range of motion. Neck supple.  Cardiovascular: Normal rate.   Respiratory: Effort normal.  GI: Soft. She exhibits no distension. There is tenderness. There is no rebound and no guarding.  laparosopy incision intact without reddness  Genitourinary:  Small amount of bright red blood in vault; cervix clean   Musculoskeletal: Normal range of motion.  Neurological: She is alert and oriented to  person, place, and time.  Skin: Skin is warm and dry.  Psychiatric: She has a normal mood and affect.    MAU Course  Procedures Results for orders placed during the hospital encounter of 08/29/13 (from the past 24 hour(s))  URINALYSIS, ROUTINE W REFLEX MICROSCOPIC     Status: Abnormal   Collection Time    08/29/13  6:20 PM      Result Value Ref Range   Color, Urine YELLOW  YELLOW   APPearance CLEAR  CLEAR   Specific Gravity, Urine <1.005 (*) 1.005 - 1.030   pH 6.0  5.0 - 8.0   Glucose, UA NEGATIVE  NEGATIVE mg/dL   Hgb urine dipstick LARGE (*) NEGATIVE   Bilirubin Urine NEGATIVE  NEGATIVE   Ketones, ur NEGATIVE  NEGATIVE mg/dL   Protein, ur NEGATIVE  NEGATIVE mg/dL   Urobilinogen, UA 0.2  0.0 - 1.0 mg/dL   Nitrite NEGATIVE  NEGATIVE   Leukocytes, UA NEGATIVE  NEGATIVE  URINE MICROSCOPIC-ADD ON     Status: None   Collection Time    08/29/13  6:20 PM      Result Value Ref Range   Squamous Epithelial / LPF RARE  RARE   RBC / HPF 0-2  <3 RBC/hpf  POCT PREGNANCY, URINE     Status: None   Collection Time    08/29/13  6:33 PM      Result Value Ref Range   Preg Test, Ur NEGATIVE  NEGATIVE  CBC     Status: Abnormal   Collection Time    08/29/13  7:00 PM      Result Value Ref Range   WBC 7.5  4.0 - 10.5 K/uL   RBC 4.03  3.87 - 5.11 MIL/uL   Hemoglobin 11.4 (*) 12.0 - 15.0 g/dL   HCT 35.5 (*) 36.0 - 46.0 %   MCV 88.1  78.0 - 100.0 fL   MCH 28.3  26.0 - 34.0 pg   MCHC 32.1  30.0 - 36.0 g/dL   RDW 13.3  11.5 - 15.5 %   Platelets 174  150 - 400 K/uL  US Transvaginal Non-ob  08/29/2013   CLINICAL DATA:  Bleeding following myomectomy 08/23/2013.  EXAM: TRANSVAGINAL ULTRASOUND OF PELVIS  TECHNIQUE: Transvaginal ultrasound examination of the pelvis was performed including evaluation of the uterus, ovaries, adnexal regions, and pelvic cul-de-sac.  COMPARISON:  None.  FINDINGS: Uterus  Measurements: 81 mm x 53 mm x 74 mm. Myometrial echotexture appears within normal limits. No fibroids  or other mass visualized.  Endometrium  Thickness: 5 mm, normal.  No focal abnormality visualized.  Right ovary  Measurements: 29 mm x 21 mm x 19 mm. Normal appearance/no adnexal mass.  Left ovary  Measurements: 53 mm x 39 mm x 33 mm. 40 mm x 33 mm x 27 mm simple cyst in the left ovary.  Other  findings:  Small amount of free fluid.  IMPRESSION: 1. Normal appearance of the uterus. 2. 4 cm left ovarian simple cyst.   Electronically Signed   By: Dereck Ligas M.D.   On: 08/29/2013 20:34     Assessment and Plan  Post op vaginal bleeding Left ovarian simple cyst F/u with Dr. Delsa Sale  [facility] 08/29/2013, 6:07 PM

## 2013-08-29 NOTE — Discharge Instructions (Signed)
Ovarian Cyst An ovarian cyst is a sac filled with fluid or blood. This sac is attached to the ovary. Some cysts go away on their own. Other cysts need treatment.  HOME CARE   Only take medicine as told by your doctor.  Follow up with your doctor as told.  Get regular pelvic exams and Pap tests. GET HELP IF:  Your periods are late, not regular, or painful.  You stop having periods.  Your belly (abdominal) or pelvic pain does not go away.  Your belly becomes large or puffy (swollen).  You have a hard time peeing (totally emptying your bladder).  You have pressure on your bladder.  You have pain during sex.  You feel fullness, pressure, or discomfort in your belly.  You lose weight for no reason.  You feel sick most of the time.  You have a hard time pooping (constipation).  You do not feel like eating.  You develop pimples (acne).  You have an increase in hair on your body and face.  You are gaining weight for no reason.  You think you are pregnant. GET HELP RIGHT AWAY IF:   Your belly pain gets worse.  You feel sick to your stomach (nauseous), and you throw up (vomit).  You have a fever that comes on fast.  You have belly pain while pooping (bowel movement).  Your periods are heavier than usual. MAKE SURE YOU:   Understand these instructions.  Will watch your condition.  Will get help right away if you are not doing well or get worse. Document Released: 08/24/2007 Document Revised: 12/26/2012 Document Reviewed: 11/12/2012 [facility] Patient Information 2014 [place].  Ovarian Cyst An ovarian cyst is a fluid-filled sac that forms on an ovary. The ovaries are small organs that produce eggs in women. Various types of cysts can form on the ovaries. Most are not cancerous. Many do not cause problems, and they often go away on their own. Some may cause symptoms and require treatment. Common types of ovarian cysts include:  Functional cysts These cysts  may occur every month during the menstrual cycle. This is normal. The cysts usually go away with the next menstrual cycle if the woman does not get pregnant. Usually, there are no symptoms with a functional cyst.  Endometrioma cysts These cysts form from the tissue that lines the uterus. They are also called "chocolate cysts" because they become filled with blood that turns brown. This type of cyst can cause pain in the lower abdomen during intercourse and with your menstrual period.  Cystadenoma cysts This type develops from the cells on the outside of the ovary. These cysts can get very big and cause lower abdomen pain and pain with intercourse. This type of cyst can twist on itself, cut off its blood supply, and cause severe pain. It can also easily rupture and cause a lot of pain.  Dermoid cysts This type of cyst is sometimes found in both ovaries. These cysts may contain different kinds of body tissue, such as skin, teeth, hair, or cartilage. They usually do not cause symptoms unless they get very big.  Theca lutein cysts These cysts occur when too much of a certain hormone (human chorionic gonadotropin) is produced and overstimulates the ovaries to produce an egg. This is most common after procedures used to assist with the conception of a baby (in vitro fertilization). CAUSES   Fertility drugs can cause a condition in which multiple large cysts are formed on the ovaries. This  is called ovarian hyperstimulation syndrome.  A condition called polycystic ovary syndrome can cause hormonal imbalances that can lead to nonfunctional ovarian cysts. SIGNS AND SYMPTOMS  Many ovarian cysts do not cause symptoms. If symptoms are present, they may include:  Pelvic pain or pressure.  Pain in the lower abdomen.  Pain during sexual intercourse.  Increasing girth (swelling) of the abdomen.  Abnormal menstrual periods.  Increasing pain with menstrual periods.  Stopping having menstrual periods  without being pregnant. DIAGNOSIS  These cysts are commonly found during a routine or annual pelvic exam. Tests may be ordered to find out more about the cyst. These tests may include:  Ultrasound.  X-ray of the pelvis.  CT scan.  MRI.  Blood tests. TREATMENT  Many ovarian cysts go away on their own without treatment. Your health care provider may want to check your cyst regularly for 2 3 months to see if it changes. For women in menopause, it is particularly important to monitor a cyst closely because of the higher rate of ovarian cancer in menopausal women. When treatment is needed, it may include any of the following:  A procedure to drain the cyst (aspiration). This may be done using a long needle and ultrasound. It can also be done through a laparoscopic procedure. This involves using a thin, lighted tube with a tiny camera on the end (laparoscope) inserted through a small incision.  Surgery to remove the whole cyst. This may be done using laparoscopic surgery or an open surgery involving a larger incision in the lower abdomen.  Hormone treatment or birth control pills. These methods are sometimes used to help dissolve a cyst. HOME CARE INSTRUCTIONS   Only take over-the-counter or prescription medicines as directed by your health care provider.  Follow up with your health care provider as directed.  Get regular pelvic exams and Pap tests. SEEK MEDICAL CARE IF:   Your periods are late, irregular, or painful, or they stop.  Your pelvic pain or abdominal pain does not go away.  Your abdomen becomes larger or swollen.  You have pressure on your bladder or trouble emptying your bladder completely.  You have pain during sexual intercourse.  You have feelings of fullness, pressure, or discomfort in your stomach.  You lose weight for no apparent reason.  You feel generally ill.  You become constipated.  You lose your appetite.  You develop acne.  You have an increase  in body and facial hair.  You are gaining weight, without changing your exercise and eating habits.  You think you are pregnant. SEEK IMMEDIATE MEDICAL CARE IF:   You have increasing abdominal pain.  You feel sick to your stomach (nauseous), and you throw up (vomit).  You develop a fever that comes on suddenly.  You have abdominal pain during a bowel movement.  Your menstrual periods become heavier than usual. Document Released: 03/07/2005 Document Revised: 12/26/2012 Document Reviewed: 11/12/2012 [facility] Patient Information 2014 [place].

## 2013-08-29 NOTE — MAU Note (Signed)
Patient presents with complaint of post-operative vaginal bleeding and states she had a myomectomy to remove a tumor on 08/23/13.

## 2013-09-18 ENCOUNTER — Ambulatory Visit (INDEPENDENT_AMBULATORY_CARE_PROVIDER_SITE_OTHER): Payer: BC Managed Care – PPO | Admitting: Obstetrics & Gynecology

## 2013-09-18 ENCOUNTER — Encounter: Payer: Self-pay | Admitting: Obstetrics & Gynecology

## 2013-09-18 VITALS — BP 107/70 | HR 73 | Temp 98.8°F | Ht 69.0 in | Wt 194.0 lb

## 2013-09-18 DIAGNOSIS — Z09 Encounter for follow-up examination after completed treatment for conditions other than malignant neoplasm: Secondary | ICD-10-CM | POA: Insufficient documentation

## 2013-09-18 NOTE — Progress Notes (Signed)
Subjective:     Kristina Neal is a 38 y.o. female who presents to the clinic 4 weeks status post robotic-assisted myomectomies for fibroids. Eating a regular diet without difficulty. Bowel movements are normal. Pain is controlled with current analgesics. Medications being used: acetaminophen and ibuprofen (OTC).  The following portions of the patient's history were reviewed and updated as appropriate: allergies, current medications, past family history, past medical history, past social history, past surgical history and problem list.  Review of Systems Pertinent items are noted in HPI.    Objective:    BP 107/70  Pulse 73  Temp(Src) 98.8 F (37.1 C)  Ht 5\' 9"  (1.753 m)  Wt 87.998 kg (194 lb)  BMI 28.64 kg/m2  LMP 08/29/2013 General:  alert  Abdomen: soft, bowel sounds active, non-tender  Incision:   healing well, no drainage, no erythema, no hernia, no seroma, no swelling, no dehiscence, incision well approximated       Assessment:    Doing well postoperatively. Operative findings again reviewed. Pathology report discussed.    Plan:    1. Continue any current medications. 2. Wound care discussed. 3. Activity restrictions: no lifting more than 5 pounds 4. Anticipated return to work: 1-2 weeks. 5. Follow up: 2 weeks.

## 2013-10-03 ENCOUNTER — Ambulatory Visit (INDEPENDENT_AMBULATORY_CARE_PROVIDER_SITE_OTHER): Payer: BC Managed Care – PPO | Admitting: Family Medicine

## 2013-10-03 ENCOUNTER — Ambulatory Visit (INDEPENDENT_AMBULATORY_CARE_PROVIDER_SITE_OTHER): Payer: BC Managed Care – PPO | Admitting: Obstetrics & Gynecology

## 2013-10-03 ENCOUNTER — Encounter: Payer: Self-pay | Admitting: Family Medicine

## 2013-10-03 VITALS — BP 111/71 | HR 83 | Temp 98.5°F | Wt 185.0 lb

## 2013-10-03 VITALS — BP 110/70 | HR 71 | Temp 98.4°F | Resp 16 | Wt 185.1 lb

## 2013-10-03 DIAGNOSIS — IMO0002 Reserved for concepts with insufficient information to code with codable children: Secondary | ICD-10-CM

## 2013-10-03 DIAGNOSIS — E1065 Type 1 diabetes mellitus with hyperglycemia: Secondary | ICD-10-CM

## 2013-10-03 DIAGNOSIS — Z09 Encounter for follow-up examination after completed treatment for conditions other than malignant neoplasm: Secondary | ICD-10-CM

## 2013-10-03 LAB — BASIC METABOLIC PANEL
BUN: 12 mg/dL (ref 6–23)
CALCIUM: 9.2 mg/dL (ref 8.4–10.5)
CO2: 26 meq/L (ref 19–32)
CREATININE: 0.5 mg/dL (ref 0.4–1.2)
Chloride: 102 mEq/L (ref 96–112)
GFR: 177.85 mL/min (ref >60.00)
Glucose, Bld: 111 mg/dL — ABNORMAL HIGH (ref 70–99)
Potassium: 4.5 mEq/L (ref 3.5–5.1)
Sodium: 136 mEq/L (ref 135–145)

## 2013-10-03 LAB — TSH: TSH: 1.09 u[IU]/mL (ref 0.35–4.50)

## 2013-10-03 LAB — LIPID PANEL
Cholesterol: 146 mg/dL (ref 0–200)
HDL: 52.4 mg/dL (ref >39.00)
LDL Cholesterol: 82 mg/dL (ref 0–99)
NONHDL: 93.6
Total CHOL/HDL Ratio: 3
Triglycerides: 60 mg/dL (ref 0.0–149.0)
VLDL: 12 mg/dL (ref 0.0–40.0)

## 2013-10-03 LAB — HEMOGLOBIN A1C: HEMOGLOBIN A1C: 7.2 % — AB (ref 4.6–6.5)

## 2013-10-03 NOTE — Patient Instructions (Signed)
Follow up as scheduled We'll notify you of your lab results and make any changes if needed We'll get you in w/ Endo ASAP Since your lows are occuring overnight, decrease your Levemir to 7 units nightly- continue the 9 in the AM Call with any questions or concerns Welcome!!  We're glad to have you!

## 2013-10-03 NOTE — Progress Notes (Signed)
   Subjective:    Patient ID: Kristina Neal, female    DOB: 27-Aug-1975, 38 y.o.   MRN: [id number]  HPI DM I- chronic problem.  Moved here from [place] last year and has not seen PCP or Endo since moving.  Currently taking Novolog 9 units TID and Levemir 9 units BID.  CBGs have been labile.  Pt reports high stress levels.  Was off Insulin x2 months in Oct/Nov.  Pt currently reporting low CBGs- not testing them but waking in 'extremely cold sweat but I'm burning up at the same time and I can barely make it down the stairs'.  Not eating regularly.   Review of Systems For ROS see HPI     Objective:   Physical Exam  Vitals reviewed. Constitutional: She is oriented to person, place, and time. She appears well-developed and well-nourished. No distress.  HENT:  Head: Normocephalic and atraumatic.  Eyes: Conjunctivae and EOM are normal. Pupils are equal, round, and reactive to light.  Neck: Normal range of motion. Neck supple. No thyromegaly present.  Cardiovascular: Normal rate, regular rhythm, normal heart sounds and intact distal pulses.   No murmur heard. Pulmonary/Chest: Effort normal and breath sounds normal. No respiratory distress.  Abdominal: Soft. She exhibits no distension. There is no tenderness.  Musculoskeletal: She exhibits no edema.  Lymphadenopathy:    She has no cervical adenopathy.  Neurological: She is alert and oriented to person, place, and time.  Skin: Skin is warm and dry.  Psychiatric: She has a normal mood and affect. Her behavior is normal.          Assessment & Plan:

## 2013-10-03 NOTE — Progress Notes (Signed)
Pre visit review using our clinic review tool, if applicable. No additional management support is needed unless otherwise documented below in the visit note. 

## 2013-10-04 ENCOUNTER — Telehealth: Payer: Self-pay | Admitting: Family Medicine

## 2013-10-04 ENCOUNTER — Encounter: Payer: Self-pay | Admitting: Obstetrics & Gynecology

## 2013-10-04 NOTE — Progress Notes (Signed)
Subjective:     Kristina Neal is a 38 y.o. female who presents to the clinic 6 weeks status post myomectomy for fibroids. Eating a regular diet without difficulty. Bowel movements are normal. The patient is not having any pain.  The following portions of the patient's history were reviewed and updated as appropriate: allergies, current medications, past family history, past medical history, past social history, past surgical history and problem list.  Review of Systems Pertinent items are noted in HPI.    Objective:    BP 111/71  Pulse 83  Temp(Src) 98.5 F (36.9 C)  Wt 83.915 kg (185 lb)  LMP 10/01/2013 General:  alert  Abdomen: soft, bowel sounds active, non-tender  Incision:   healing well, no drainage, no erythema, no hernia, no seroma, no swelling, no dehiscence, incision well approximated    Pelvic: uterus anteverted, NT; no adnexal masses   Assessment:    Doing well postoperatively. Operative findings again reviewed. Pathology report discussed.    Plan:    1. Continue any current medications. 2. Activity restrictions: none 3. Anticipated return to work: now. 4. Follow up: 6 weeks.

## 2013-10-04 NOTE — Telephone Encounter (Signed)
Called pt again and lmovm informing her to decrease levemir to 7 units and we will keep endo referral as discussed in ov.

## 2013-10-04 NOTE — Telephone Encounter (Signed)
A user error has taken place.

## 2013-10-04 NOTE — Telephone Encounter (Signed)
Called pt back and lmovm to return call.

## 2013-10-04 NOTE — Assessment & Plan Note (Signed)
New to provider, ongoing for pt.  She is having recurrent symptomatic lows overnight- will decrease nightly dose of Levemir to 7 units.  Stressed the need to eat regularly.  Refer to Endo ASAP as she has not had diabetes management since moving here in Sept.  Check labs.  Will follow.

## 2013-10-04 NOTE — Telephone Encounter (Signed)
Caller name: Liddie Relation to pt: patient Call back number: [phone] Pharmacy:  Reason for call: Patient call regarding her lab results and asked to speak to [facility]

## 2013-10-09 ENCOUNTER — Ambulatory Visit: Payer: BC Managed Care – PPO | Admitting: Endocrinology

## 2013-10-25 ENCOUNTER — Encounter (HOSPITAL_COMMUNITY): Payer: Self-pay | Admitting: Advanced Practice Midwife

## 2013-10-25 ENCOUNTER — Inpatient Hospital Stay (HOSPITAL_COMMUNITY)
Admission: AD | Admit: 2013-10-25 | Discharge: 2013-10-26 | Disposition: A | Discharge status: Home / Self Care | Payer: BC Managed Care – PPO | Source: Ambulatory Visit | Attending: Obstetrics & Gynecology | Admitting: Obstetrics & Gynecology

## 2013-10-25 DIAGNOSIS — R109 Unspecified abdominal pain: Secondary | ICD-10-CM | POA: Insufficient documentation

## 2013-10-25 DIAGNOSIS — K219 Gastro-esophageal reflux disease without esophagitis: Secondary | ICD-10-CM | POA: Diagnosis not present

## 2013-10-25 DIAGNOSIS — R102 Pelvic and perineal pain: Secondary | ICD-10-CM

## 2013-10-25 DIAGNOSIS — Z87891 Personal history of nicotine dependence: Secondary | ICD-10-CM | POA: Diagnosis not present

## 2013-10-25 LAB — URINALYSIS, ROUTINE W REFLEX MICROSCOPIC
Bilirubin Urine: NEGATIVE
GLUCOSE, UA: 250 mg/dL — AB
Hgb urine dipstick: NEGATIVE
Ketones, ur: NEGATIVE mg/dL
LEUKOCYTES UA: NEGATIVE
Nitrite: NEGATIVE
PH: 6 (ref 5.0–8.0)
Protein, ur: NEGATIVE mg/dL
SPECIFIC GRAVITY, URINE: 1.015 (ref 1.005–1.030)
Urobilinogen, UA: 0.2 mg/dL (ref 0.0–1.0)

## 2013-10-25 LAB — CBC WITH DIFFERENTIAL/PLATELET
BASOS PCT: 0 % (ref 0–1)
Basophils Absolute: 0 10*3/uL (ref 0.0–0.1)
EOS ABS: 0.1 10*3/uL (ref 0.0–0.7)
Eosinophils Relative: 1 % (ref 0–5)
HCT: 35.3 % — ABNORMAL LOW (ref 36.0–46.0)
Hemoglobin: 11.6 g/dL — ABNORMAL LOW (ref 12.0–15.0)
LYMPHS ABS: 2.2 10*3/uL (ref 0.7–4.0)
Lymphocytes Relative: 27 % (ref 12–46)
MCH: 29 pg (ref 26.0–34.0)
MCHC: 32.9 g/dL (ref 30.0–36.0)
MCV: 88.3 fL (ref 78.0–100.0)
MONOS PCT: 10 % (ref 3–12)
Monocytes Absolute: 0.8 10*3/uL (ref 0.1–1.0)
NEUTROS PCT: 62 % (ref 43–77)
Neutro Abs: 4.9 10*3/uL (ref 1.7–7.7)
PLATELETS: 184 10*3/uL (ref 150–400)
RBC: 4 MIL/uL (ref 3.87–5.11)
RDW: 14.4 % (ref 11.5–15.5)
WBC: 8 10*3/uL (ref 4.0–10.5)

## 2013-10-25 LAB — COMPREHENSIVE METABOLIC PANEL
ALK PHOS: 52 U/L (ref 39–117)
ALT: 12 U/L (ref 0–35)
ANION GAP: 10 (ref 5–15)
AST: 17 U/L (ref 0–37)
Albumin: 3.8 g/dL (ref 3.5–5.2)
BUN: 12 mg/dL (ref 6–23)
CALCIUM: 8.7 mg/dL (ref 8.4–10.5)
CO2: 27 mEq/L (ref 19–32)
Chloride: 101 mEq/L (ref 96–112)
Creatinine, Ser: 0.8 mg/dL (ref 0.50–1.10)
GFR calc non Af Amer: >90 mL/min (ref >90)
GLUCOSE: 194 mg/dL — AB (ref 70–99)
POTASSIUM: 5.1 meq/L (ref 3.7–5.3)
SODIUM: 138 meq/L (ref 137–147)
Total Bilirubin: 0.3 mg/dL (ref 0.3–1.2)
Total Protein: 7 g/dL (ref 6.0–8.3)

## 2013-10-25 LAB — POCT PREGNANCY, URINE: Preg Test, Ur: NEGATIVE

## 2013-10-25 MED ORDER — KETOROLAC TROMETHAMINE 60 MG/2ML IM SOLN
60.0000 mg | Freq: Once | INTRAMUSCULAR | Status: AC
  Administered 2013-10-25: 60 mg via INTRAMUSCULAR
  Filled 2013-10-25: qty 2

## 2013-10-25 NOTE — MAU Provider Note (Signed)
History     CSN: [id number]  Arrival date and time: 10/25/13 2057   None     Chief Complaint  Patient presents with  . Abdominal Pain   HPI This is a 38 y.o. female who presents with c/o abdominal pain for a week. States has had pain ever since her surgery in June (though her postop visit note in office stated she was having no pain).  States it became sharper today.  Denies fever, nausea, vomiting or diarrhea. Has had some constipation.  Her history is remarkable for a Myomectomy (single pedunculated fibroid, robotic) and bilateral salpingectomy (part of note says salpingectomy, other part does not confirm this) on 08/23/13.  No complications. Her post op visit showed good healing and no pain.   RN Note: Having lower abd pain for a wk. Had myomectomy 6/5. Denies bleeding or d/c       OB History   Grav Para Term Preterm Abortions TAB SAB Ect Mult Living   4    4  4    0      Past Medical History  Diagnosis Date  . Diabetes mellitus without complication   . Asthma   . GERD (gastroesophageal reflux disease)   . Anemia     Past Surgical History  Procedure Laterality Date  . Cervical biopsy  w/ loop electrode excision    . Whipple procedure    . Cervical tumor removed    . Dilation and curettage of uterus  2012  . Leep    . Robot assisted myomectomy N/A 08/23/2013    Procedure: ROBOTIC ASSISTED MYOMECTOMY;  Surgeon: Lahoma Crocker, MD;  Location: [facility];  Service: Gynecology;  Laterality: N/A;    Family History  Problem Relation Age of Onset  . Stroke Mother   . Hypertension Mother   . Diabetes Father   . Hypertension Maternal Grandmother   . Dementia Maternal Grandmother   . Cancer Maternal Grandfather     bone  . Diabetes Paternal Grandmother   . Hypertension Paternal Grandmother   . Cancer Paternal Grandfather     prostate    History  Substance Use Topics  . Smoking status: Former Smoker    Types: Cigarettes    Quit date: 03/21/2006  . Smokeless  tobacco: Never Used  . Alcohol Use: Yes     Comment: occaisionally    Allergies:  Allergies  Allergen Reactions  . Sulfa Antibiotics Other (See Comments)    Causes headache.  . Amoxicillin Hives and Rash    Prescriptions prior to admission  Medication Sig Dispense Refill  . acetaminophen (TYLENOL) 325 MG tablet Take 650 mg by mouth every 6 (six) hours as needed.      . insulin aspart (NOVOLOG) 100 UNIT/ML injection Inject 9 Units into the skin 3 (three) times daily before meals.      . insulin detemir (LEVEMIR) 100 UNIT/ML injection Inject 9 Units into the skin 2 (two) times daily. Patient administers first thing in the morning and at bedtime.      Marland Kitchen omeprazole (PRILOSEC) 40 MG capsule Take 40 mg by mouth daily as needed (for acid reflux).      . Prenatal Vit-Fe Fumarate-FA (PRENATAL MULTIVITAMIN) TABS tablet Take 1 tablet by mouth daily at 12 noon.      . vitamin C (ASCORBIC ACID) 500 MG tablet Take 500 mg by mouth daily.       Marland Kitchen albuterol (PROVENTIL HFA;VENTOLIN HFA) 108 (90 BASE) MCG/ACT inhaler Inhale 1-2 puffs into the  lungs every 6 (six) hours as needed for wheezing or shortness of breath.      . Mefenamic Acid 250 MG CAPS       . oxyCODONE-acetaminophen (PERCOCET) 10-325 MG per tablet Take 1 tablet by mouth every 4 (four) hours as needed for pain.  30 tablet  0    Review of Systems  Constitutional: Negative for fever, chills and malaise/fatigue.  Gastrointestinal: Positive for abdominal pain and constipation. Negative for nausea, vomiting and diarrhea.  Genitourinary: Negative for dysuria.  Neurological: Negative for dizziness and headaches.   Physical Exam   Blood pressure 117/66, pulse 74, temperature 99.2 F (37.3 C), resp. rate 18, height $RemoveBe'5\' 10"'espAuMxJq$  (1.778 m), weight 85.821 kg (189 lb 3.2 oz), last menstrual period 10/01/2013.  Physical Exam  Constitutional: She is oriented to person, place, and time. She appears well-developed and well-nourished. No distress.  HENT:   Head: Normocephalic.  Cardiovascular: Normal rate.   Respiratory: Effort normal.  GI: Soft. She exhibits no distension and no mass. There is tenderness (pelvic tenderness, slightly more on left). There is no rebound and no guarding.  Genitourinary: Vagina normal and uterus normal. No vaginal discharge found.  Declines cultures   Musculoskeletal: Normal range of motion.  Neurological: She is alert and oriented to person, place, and time.  Skin: Skin is warm and dry.  Psychiatric: She has a normal mood and affect.    MAU Course  Procedures  MDM Will give dose of Toradol and get an [place].  Check CBC and CMET Results for orders placed during the hospital encounter of 10/25/13 (from the past 72 hour(s))  URINALYSIS, ROUTINE W REFLEX MICROSCOPIC     Status: Abnormal   Collection Time    10/25/13  9:30 PM      Result Value Ref Range   Color, Urine YELLOW  YELLOW   APPearance CLEAR  CLEAR   Specific Gravity, Urine 1.015  1.005 - 1.030   pH 6.0  5.0 - 8.0   Glucose, UA 250 (*) NEGATIVE mg/dL   Hgb urine dipstick NEGATIVE  NEGATIVE   Bilirubin Urine NEGATIVE  NEGATIVE   Ketones, ur NEGATIVE  NEGATIVE mg/dL   Protein, ur NEGATIVE  NEGATIVE mg/dL   Urobilinogen, UA 0.2  0.0 - 1.0 mg/dL   Nitrite NEGATIVE  NEGATIVE   Leukocytes, UA NEGATIVE  NEGATIVE   Comment: MICROSCOPIC NOT DONE ON URINES WITH NEGATIVE PROTEIN, BLOOD, LEUKOCYTES, NITRITE, OR GLUCOSE <1000 mg/dL.  POCT PREGNANCY, URINE     Status: None   Collection Time    10/25/13  9:38 PM      Result Value Ref Range   Preg Test, Ur NEGATIVE  NEGATIVE   Comment:            THE SENSITIVITY OF THIS     METHODOLOGY IS >24 mIU/mL  CBC WITH DIFFERENTIAL     Status: Abnormal   Collection Time    10/25/13 10:45 PM      Result Value Ref Range   WBC 8.0  4.0 - 10.5 K/uL   RBC 4.00  3.87 - 5.11 MIL/uL   Hemoglobin 11.6 (*) 12.0 - 15.0 g/dL   HCT 35.3 (*) 36.0 - 46.0 %   MCV 88.3  78.0 - 100.0 fL   MCH 29.0  26.0 - 34.0 pg   MCHC 32.9   30.0 - 36.0 g/dL   RDW 14.4  11.5 - 15.5 %   Platelets 184  150 - 400 K/uL   Neutrophils Relative %  62  43 - 77 %   Neutro Abs 4.9  1.7 - 7.7 K/uL   Lymphocytes Relative 27  12 - 46 %   Lymphs Abs 2.2  0.7 - 4.0 K/uL   Monocytes Relative 10  3 - 12 %   Monocytes Absolute 0.8  0.1 - 1.0 K/uL   Eosinophils Relative 1  0 - 5 %   Eosinophils Absolute 0.1  0.0 - 0.7 K/uL   Basophils Relative 0  0 - 1 %   Basophils Absolute 0.0  0.0 - 0.1 K/uL  COMPREHENSIVE METABOLIC PANEL     Status: Abnormal   Collection Time    10/25/13 10:45 PM      Result Value Ref Range   Sodium 138  137 - 147 mEq/L   Potassium 5.1  3.7 - 5.3 mEq/L   Chloride 101  96 - 112 mEq/L   CO2 27  19 - 32 mEq/L   Glucose, Bld 194 (*) 70 - 99 mg/dL   BUN 12  6 - 23 mg/dL   Creatinine, Ser 0.80  0.50 - 1.10 mg/dL   Calcium 8.7  8.4 - 10.5 mg/dL   Total Protein 7.0  6.0 - 8.3 g/dL   Albumin 3.8  3.5 - 5.2 g/dL   AST 17  0 - 37 U/L   ALT 12  0 - 35 U/L   Alkaline Phosphatase 52  39 - 117 U/L   Total Bilirubin 0.3  0.3 - 1.2 mg/dL   GFR calc non Af Amer >90  >90 mL/min   GFR calc Af Amer >90  >90 mL/min   Comment: (NOTE)     The eGFR has been calculated using the CKD EPI equation.     This calculation has not been validated in all clinical situations.     eGFR's persistently <90 mL/min signify possible Chronic Kidney     Disease.   Anion gap 10  5 - 15   US Transvaginal Non-ob  10/26/2013   CLINICAL DATA:  Lower abdominal pain for 1 week, history of myomectomy June 5th  EXAM: TRANSVAGINAL ULTRASOUND OF PELVIS  TECHNIQUE: Transvaginal ultrasound examination of the pelvis was performed including evaluation of the uterus, ovaries, adnexal regions, and pelvic cul-de-sac.  COMPARISON:  None.  FINDINGS: Uterus  Measurements: 82 x 41 x 60 mm. No fibroids or other mass visualized.  Endometrium  Thickness: 11 mm.  No focal abnormality visualized.  Right ovary  Measurements: 33 x 12 x 20 mm. Normal appearance/no adnexal mass.  Left  ovary  Measurements: 44 x 20 x 21 mm. Normal appearance/no adnexal mass.  Other findings:  No free fluid    IMPRESSION: No significant abnormalities     Electronically Signed   By: Skipper Cliche M.D.   On: 10/26/2013 01:13    Toradol given with some relief  Assessment and Plan  A;  Abdominal pain, unsure etiology      Negative lab and US findings  P:  Discussed normal findings      Rx limited Percocet      Advised patient to call Dr Delsa Sale on Monday for evaluation.  [facility] 10/25/2013, 10:56 PM

## 2013-10-25 NOTE — MAU Note (Signed)
Having lower abd pain for a wk. Had myomectomy 6/5. Denies bleeding or d/c

## 2013-10-26 ENCOUNTER — Inpatient Hospital Stay (HOSPITAL_COMMUNITY): Payer: BC Managed Care – PPO

## 2013-10-26 ENCOUNTER — Encounter (HOSPITAL_COMMUNITY): Payer: Self-pay | Admitting: *Deleted

## 2013-10-26 DIAGNOSIS — N949 Unspecified condition associated with female genital organs and menstrual cycle: Secondary | ICD-10-CM

## 2013-10-26 MED ORDER — OXYCODONE-ACETAMINOPHEN 5-325 MG PO TABS
2.0000 | ORAL_TABLET | Freq: Once | ORAL | Status: AC
  Administered 2013-10-26: 2 via ORAL
  Filled 2013-10-26: qty 2

## 2013-10-26 MED ORDER — OXYCODONE-ACETAMINOPHEN 5-325 MG PO TABS: 1.0000 | ORAL_TABLET | Freq: Four times a day (QID) | ORAL | Status: DC | PRN

## 2013-10-26 NOTE — Discharge Instructions (Signed)
Abdominal Pain, Women °Abdominal (stomach, pelvic, or belly) pain can be caused by many things. It is important to tell your doctor: °· The location of the pain. °· Does it come and go or is it present all the time? °· Are there things that start the pain (eating certain foods, exercise)? °· Are there other symptoms associated with the pain (fever, nausea, vomiting, diarrhea)? °All of this is helpful to know when trying to find the cause of the pain. °CAUSES  °· Stomach: virus or bacteria infection, or ulcer. °· Intestine: appendicitis (inflamed appendix), regional ileitis (Crohn's disease), ulcerative colitis (inflamed colon), irritable bowel syndrome, diverticulitis (inflamed diverticulum of the colon), or cancer of the stomach or intestine. °· Gallbladder disease or stones in the gallbladder. °· Kidney disease, kidney stones, or infection. °· Pancreas infection or cancer. °· Fibromyalgia (pain disorder). °· Diseases of the female organs: °¨ Uterus: fibroid (non-cancerous) tumors or infection. °¨ Fallopian tubes: infection or tubal pregnancy. °¨ Ovary: cysts or tumors. °¨ Pelvic adhesions (scar tissue). °¨ Endometriosis (uterus lining tissue growing in the pelvis and on the pelvic organs). °¨ Pelvic congestion syndrome (female organs filling up with blood just before the menstrual period). °¨ Pain with the menstrual period. °¨ Pain with ovulation (producing an egg). °¨ Pain with an IUD (intrauterine device, birth control) in the uterus. °¨ Cancer of the female organs. °· Functional pain (pain not caused by a disease, may improve without treatment). °· Psychological pain. °· Depression. °DIAGNOSIS  °Your doctor will decide the seriousness of your pain by doing an examination. °· Blood tests. °· X-rays. °· Ultrasound. °· CT scan (computed tomography, special type of X-ray). °· MRI (magnetic resonance imaging). °· Cultures, for infection. °· Barium enema (dye inserted in the large intestine, to better view it with  X-rays). °· Colonoscopy (looking in intestine with a lighted tube). °· Laparoscopy (minor surgery, looking in abdomen with a lighted tube). °· Major abdominal exploratory surgery (looking in abdomen with a large incision). °TREATMENT  °The treatment will depend on the cause of the pain.  °· Many cases can be observed and treated at home. °· Over-the-counter medicines recommended by your caregiver. °· Prescription medicine. °· Antibiotics, for infection. °· Birth control pills, for painful periods or for ovulation pain. °· Hormone treatment, for endometriosis. °· Nerve blocking injections. °· Physical therapy. °· Antidepressants. °· Counseling with a psychologist or psychiatrist. °· Minor or major surgery. °HOME CARE INSTRUCTIONS  °· Do not take laxatives, unless directed by your caregiver. °· Take over-the-counter pain medicine only if ordered by your caregiver. Do not take aspirin because it can cause an upset stomach or bleeding. °· Try a clear liquid diet (broth or water) as ordered by your caregiver. Slowly move to a bland diet, as tolerated, if the pain is related to the stomach or intestine. °· Have a thermometer and take your temperature several times a day, and record it. °· Bed rest and sleep, if it helps the pain. °· Avoid sexual intercourse, if it causes pain. °· Avoid stressful situations. °· Keep your follow-up appointments and tests, as your caregiver orders. °· If the pain does not go away with medicine or surgery, you may try: °¨ Acupuncture. °¨ Relaxation exercises (yoga, meditation). °¨ Group therapy. °¨ Counseling. °SEEK MEDICAL CARE IF:  °· You notice certain foods cause stomach pain. °· Your home care treatment is not helping your pain. °· You need stronger pain medicine. °· You want your IUD removed. °· You feel faint or   lightheaded. °· You develop nausea and vomiting. °· You develop a rash. °· You are having side effects or an allergy to your medicine. °SEEK IMMEDIATE MEDICAL CARE IF:  °· Your  pain does not go away or gets worse. °· You have a fever. °· Your pain is felt only in portions of the abdomen. The right side could possibly be appendicitis. The left lower portion of the abdomen could be colitis or diverticulitis. °· You are passing blood in your stools (bright red or black tarry stools, with or without vomiting). °· You have blood in your urine. °· You develop chills, with or without a fever. °· You pass out. °MAKE SURE YOU:  °· Understand these instructions. °· Will watch your condition. °· Will get help right away if you are not doing well or get worse. °Document Released: 01/02/2007 Document Revised: 07/22/2013 Document Reviewed: 01/22/2009 °[employer]® Patient Information ©2015 [employer]. This information is not intended to replace advice given to you by your health care provider. Make sure you discuss any questions you have with your health care provider. ° °

## 2013-10-30 ENCOUNTER — Telehealth: Payer: Self-pay | Admitting: *Deleted

## 2013-10-30 NOTE — Telephone Encounter (Signed)
Message copied by Romana Juniper on Wed Oct 30, 2013  2:18 PM ------      Message from: Lahoma Crocker      Created: Sun Oct 27, 2013 11:41 AM       Call pt.  Inform her error in operative dictation.  Her tubes were not removed.  Will amend. ------

## 2013-10-30 NOTE — Telephone Encounter (Signed)
Left message for patient to call back regarding a message about her surgery from Dr Delsa Sale.

## 2013-10-31 NOTE — Telephone Encounter (Signed)
Patient returned call at 10:39- 11:10 called patient back got no answer- left message from Dr Delsa Sale on her voice mail and told patient she could call back if she had any further questions from Dr Delsa Sale.

## 2013-10-31 NOTE — Telephone Encounter (Signed)
Patient returned call yesterday and requested call back in am as she was at work and would be off on Thursday.  10:30 LM on VM to CB.

## 2013-11-04 ENCOUNTER — Encounter: Payer: Self-pay | Admitting: Endocrinology

## 2013-11-04 ENCOUNTER — Ambulatory Visit: Payer: BC Managed Care – PPO | Admitting: Family Medicine

## 2013-11-04 ENCOUNTER — Ambulatory Visit (INDEPENDENT_AMBULATORY_CARE_PROVIDER_SITE_OTHER): Payer: BC Managed Care – PPO | Admitting: Endocrinology

## 2013-11-04 VITALS — BP 122/84 | HR 76 | Temp 98.9°F | Ht 70.0 in | Wt 181.0 lb

## 2013-11-04 DIAGNOSIS — E1065 Type 1 diabetes mellitus with hyperglycemia: Secondary | ICD-10-CM

## 2013-11-04 DIAGNOSIS — IMO0002 Reserved for concepts with insufficient information to code with codable children: Secondary | ICD-10-CM

## 2013-11-04 MED ORDER — INSULIN NPH ISOPHANE & REGULAR (70-30) 100 UNIT/ML ~~LOC~~ SUSP: SUBCUTANEOUS | Status: DC

## 2013-11-04 NOTE — Progress Notes (Signed)
Subjective:    Patient ID: Kristina Neal, female    DOB: 01/23/1976, 38 y.o.   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  She has mild hypoglycemia approx once every 2 weeks.  She misses approx 1/3 of her insulin injections.   Past Medical History  Diagnosis Date  . Diabetes mellitus without complication   . Asthma   . GERD (gastroesophageal reflux disease)   . Anemia     Past Surgical History  Procedure Laterality Date  . Cervical biopsy  w/ loop electrode excision    . Whipple procedure    . Cervical tumor removed    . Dilation and curettage of uterus  2012  . Leep    . Robot assisted myomectomy N/A 08/23/2013    Procedure: ROBOTIC ASSISTED MYOMECTOMY;  Surgeon: Lahoma Crocker, MD;  Location: [facility];  Service: Gynecology;  Laterality: N/A;    History   Social History  . Marital Status: Legally Separated    Spouse Name: N/A    Number of Children: N/A  . Years of Education: N/A   Occupational History  . Not on file.   Social History Main Topics  . Smoking status: Former Smoker    Types: Cigarettes    Quit date: 03/21/2006  . Smokeless tobacco: Never Used  . Alcohol Use: Yes     Comment: occaisionally  . Drug Use: No  . Sexual Activity: Not Currently    Partners: Male    Birth Control/ Protection: None   Other Topics Concern  . Not on file   Social History Narrative  . No narrative on file    Current Outpatient Prescriptions on File Prior to Visit  Medication Sig Dispense Refill  . acetaminophen (TYLENOL) 325 MG tablet Take 650 mg by mouth every 6 (six) hours as needed.      Marland Kitchen albuterol  (PROVENTIL HFA;VENTOLIN HFA) 108 (90 BASE) MCG/ACT inhaler Inhale 1-2 puffs into the lungs every 6 (six) hours as needed for wheezing or shortness of breath.      . insulin aspart (NOVOLOG) 100 UNIT/ML injection Inject 9 Units into the skin 3 (three) times daily before meals.      . insulin detemir (LEVEMIR) 100 UNIT/ML injection Inject 9 Units into the skin 2 (two) times daily. Patient administers first thing in the morning and at bedtime.      . Mefenamic Acid 250 MG CAPS       . omeprazole (PRILOSEC) 40 MG capsule Take 40 mg by mouth daily as needed (for acid reflux).      Marland Kitchen oxyCODONE-acetaminophen (PERCOCET/ROXICET) 5-325 MG per tablet Take 1-2 tablets by mouth every 6 (six) hours as needed for severe pain.  20 tablet  0  . Prenatal Vit-Fe Fumarate-FA (PRENATAL MULTIVITAMIN) TABS tablet Take 1 tablet by mouth daily at 12 noon.      . vitamin C (ASCORBIC ACID) 500 MG tablet Take 500 mg by mouth daily.        No current facility-administered medications on file prior to visit.    Allergies  Allergen Reactions  . Sulfa Antibiotics Other (See Comments)  Causes headache.  . Amoxicillin Hives and Rash    Family History  Problem Relation Age of Onset  . Stroke Mother   . Hypertension Mother   . Diabetes Father   . Hypertension Maternal Grandmother   . Dementia Maternal Grandmother   . Cancer Maternal Grandfather     bone  . Diabetes Paternal Grandmother   . Hypertension Paternal Grandmother   . Cancer Paternal Grandfather     prostate    BP 122/84  Pulse 76  Temp(Src) 98.9 F (37.2 C) (Oral)  Ht 5\' 10"  (1.778 m)  Wt 181 lb (82.101 kg)  BMI 25.97 kg/m2  SpO2 99%   Review of Systems denies weight loss, blurry vision, chest pain, sob, n/v, urinary frequency, excessive diaphoresis,depression, rhinorrhea, and easy bruising.  She has headache, cold intolerance, and muscle cramps.  She has irreg menses.    Objective:   Physical Exam VS: see vs page GEN: no distress HEAD:  head: no deformity eyes: no periorbital swelling, no proptosis external nose and ears are normal mouth: no lesion seen NECK: supple, thyroid is not enlarged CHEST WALL: no deformity LUNGS:  Clear to auscultation CV: reg rate and rhythm, no murmur ABD: abdomen is soft, nontender.  no hepatosplenomegaly.  not distended.  no hernia. MUSCULOSKELETAL: muscle bulk and strength are grossly normal.  no obvious joint swelling.  gait is normal and steady EXTEMITIES: no deformity.  no ulcer on the feet.  feet are of normal color and temp.  no edema PULSES: dorsalis pedis intact bilat.  no carotid bruit NEURO:  cn 2-12 grossly intact.   readily moves all 4's.  sensation is intact to touch on the feet SKIN:  Normal texture and temperature.  No rash or suspicious lesion is visible.   NODES:  None palpable at the neck PSYCH: alert, well-oriented.  Does not appear anxious nor depressed.   Lab Results  Component Value Date   HGBA1C 7.2* 10/03/2013  i reviewed electrocardiogram (08/15/13)  i have reviewed the following outside records: Office notes    Assessment & Plan:  DM, new to me: mild exacerbation. Noncompliance with cbg recording and insulin dosing: she needs a simpler insulin regimen, and achievable goals.   DKA: this is due to noncompliance.     Patient is advised the following: Patient Instructions  good diet and exercise habits significanly improve the control of your diabetes.  please let me know if you wish to be referred to a dietician.  high blood sugar is very risky to your health.  you should see an eye doctor and dentist every year.  You are at higher than average risk for pneumonia and hepatitis-B.  You should be vaccinated against both.   controlling your blood pressure and cholesterol drastically reduces the damage diabetes does to your body.  this also applies to quitting smoking.  please discuss these with your doctor.  check your blood sugar twice a day.  vary the time of day  when you check, between before the 3 meals, and at bedtime.  also check if you have symptoms of your blood sugar being too high or too low.  please keep a record of the readings and bring it to your next appointment here.  You can write it on any piece of paper.  please call us sooner if your blood sugar goes below 70, or if you have a lot of readings over 200. In view of your medical condition, you should avoid pregnancy until we have  decided it is safe.   Please change both insulins to "70/30," 25 units with breakfast, and 15 units with the evening meal. Please come back for a follow-up appointment in 2 weeks

## 2013-11-04 NOTE — Patient Instructions (Addendum)
good diet and exercise habits significanly improve the control of your diabetes.  please let me know if you wish to be referred to a dietician.  high blood sugar is very risky to your health.  you should see an eye doctor and dentist every year.  You are at higher than average risk for pneumonia and hepatitis-B.  You should be vaccinated against both.   controlling your blood pressure and cholesterol drastically reduces the damage diabetes does to your body.  this also applies to quitting smoking.  please discuss these with your doctor.  check your blood sugar twice a day.  vary the time of day when you check, between before the 3 meals, and at bedtime.  also check if you have symptoms of your blood sugar being too high or too low.  please keep a record of the readings and bring it to your next appointment here.  You can write it on any piece of paper.  please call us sooner if your blood sugar goes below 70, or if you have a lot of readings over 200. In view of your medical condition, you should avoid pregnancy until we have decided it is safe.   Please change both insulins to "70/30," 25 units with breakfast, and 15 units with the evening meal. Please come back for a follow-up appointment in 2 weeks

## 2013-11-07 ENCOUNTER — Encounter: Payer: Self-pay | Admitting: Obstetrics & Gynecology

## 2013-11-07 ENCOUNTER — Other Ambulatory Visit: Payer: Self-pay | Admitting: Obstetrics & Gynecology

## 2013-11-07 ENCOUNTER — Ambulatory Visit (INDEPENDENT_AMBULATORY_CARE_PROVIDER_SITE_OTHER): Payer: BC Managed Care – PPO | Admitting: Obstetrics & Gynecology

## 2013-11-07 VITALS — BP 108/76 | Temp 98.2°F | Ht 70.0 in | Wt 188.0 lb

## 2013-11-07 DIAGNOSIS — R109 Unspecified abdominal pain: Secondary | ICD-10-CM

## 2013-11-07 DIAGNOSIS — R1030 Lower abdominal pain, unspecified: Secondary | ICD-10-CM

## 2013-11-08 LAB — URINALYSIS, ROUTINE W REFLEX MICROSCOPIC
Bilirubin Urine: NEGATIVE
Glucose, UA: NEGATIVE mg/dL
Hgb urine dipstick: NEGATIVE
Ketones, ur: NEGATIVE mg/dL
LEUKOCYTES UA: NEGATIVE
NITRITE: NEGATIVE
Protein, ur: NEGATIVE mg/dL
SPECIFIC GRAVITY, URINE: 1.008 (ref 1.005–1.030)
UROBILINOGEN UA: 0.2 mg/dL (ref 0.0–1.0)
pH: 6 (ref 5.0–8.0)

## 2013-11-09 LAB — URINE CULTURE
COLONY COUNT: NO GROWTH
Organism ID, Bacteria: NO GROWTH

## 2013-11-09 LAB — GC/CHLAMYDIA PROBE AMP
CT PROBE, AMP APTIMA: NEGATIVE
GC Probe RNA: NEGATIVE

## 2013-11-10 NOTE — Progress Notes (Signed)
Patient ID: Kristina Neal, female   DOB: Apr 27, 1975, 38 y.o.   MRN: [id number]  Chief Complaint  Patient presents with  . Follow-up    Pain    HPI Kristina Neal is a 38 y.o. female.   Abdominal Pain This is a new problem. The current episode started 1 to 4 weeks ago. The problem has been unchanged. The pain is located in the suprapubic region. The abdominal pain does not radiate. Pertinent negatives include no constipation, diarrhea, dysuria, fever, frequency, nausea or vomiting. Associated symptoms comments: Back pain. Prior workup: AXR. recent robotic-assisted myomectomy    Past Medical History  Diagnosis Date  . Diabetes mellitus without complication   . Asthma   . GERD (gastroesophageal reflux disease)   . Anemia     Past Surgical History  Procedure Laterality Date  . Cervical biopsy  w/ loop electrode excision    . Whipple procedure    . Cervical tumor removed    . Dilation and curettage of uterus  2012  . Leep    . Robot assisted myomectomy N/A 08/23/2013    Procedure: ROBOTIC ASSISTED MYOMECTOMY;  Surgeon: Lahoma Crocker, MD;  Location: [facility];  Service: Gynecology;  Laterality: N/A;    Family History  Problem Relation Age of Onset  . Stroke Mother   . Hypertension Mother   . Diabetes Father   . Hypertension Maternal Grandmother   . Dementia Maternal Grandmother   . Cancer Maternal Grandfather     bone  . Diabetes Paternal Grandmother   . Hypertension Paternal Grandmother   . Cancer Paternal Grandfather     prostate    Social History History  Substance Use Topics  . Smoking status: Former Smoker    Types: Cigarettes    Quit date: 03/21/2006  . Smokeless tobacco: Never Used  . Alcohol Use: Yes     Comment: occaisionally    Allergies  Allergen Reactions  . Sulfa Antibiotics Other (See Comments)    Causes headache.  . Amoxicillin Hives and Rash    Current Outpatient Prescriptions  Medication Sig Dispense Refill  .  acetaminophen (TYLENOL) 325 MG tablet Take 650 mg by mouth every 6 (six) hours as needed.      Marland Kitchen albuterol (PROVENTIL HFA;VENTOLIN HFA) 108 (90 BASE) MCG/ACT inhaler Inhale 1-2 puffs into the lungs every 6 (six) hours as needed for wheezing or shortness of breath.      . insulin NPH-regular Human (HUMULIN 70/30) (70-30) 100 UNIT/ML injection 25 units with breakfast, and 15 units with the evening meal, and syringes twice a day  10 mL  11  . Mefenamic Acid 250 MG CAPS       . omeprazole (PRILOSEC) 40 MG capsule Take 40 mg by mouth daily as needed (for acid reflux).      . Prenatal Vit-Fe Fumarate-FA (PRENATAL MULTIVITAMIN) TABS tablet Take 1 tablet by mouth daily at 12 noon.      . vitamin C (ASCORBIC ACID) 500 MG tablet Take 500 mg by mouth daily.        No current facility-administered medications for this visit.    Review of Systems Review of Systems  Constitutional: Negative for fever.  Gastrointestinal: Positive for abdominal pain. Negative for nausea, vomiting, diarrhea and constipation.  Genitourinary: Negative for dysuria and frequency.   Constitutional: negative for fatigue and weight loss Respiratory: negative for cough and wheezing Cardiovascular: negative for chest pain, fatigue and palpitations Gastrointestinal: negative for abdominal pain and change in bowel habits  Genitourinary:negative for abnormal vaginal discharge Integument/breast: negative for nipple discharge Musculoskeletal:negative for myalgias Neurological: negative for gait problems and tremors Behavioral/Psych: negative for abusive relationship, depression Endocrine: negative for temperature intolerance     Blood pressure 108/76, temperature 98.2 F (36.8 C), height 5\' 10"  (1.778 m), weight 85.276 kg (188 lb), last menstrual period 11/02/2013.  Physical Exam Physical Exam General:   alert  Skin:   no rash or abnormalities  Lungs:   clear to auscultation bilaterally  Heart:   regular rate and rhythm, S1, S2  normal, no murmur, click, rub or gallop  Breasts:   normal without suspicious masses, skin or nipple changes or axillary nodes  Abdomen:  normal findings: no organomegaly, soft, non-tender and no hernia  Pelvis:  External genitalia: normal general appearance Urinary system: urethral meatus normal and bladder without fullness, nontender Vaginal: normal without tenderness, induration or masses Cervix: normal appearance Adnexa: normal bimanual exam Uterus: anteverted and non-tender, normal size       Data Reviewed labs  Assessment    ?Etiology of complaints--?musculoskeletal etiology    Plan    Orders Placed This Encounter  Procedures  . Urine culture  . GC/Chlamydia Probe Amp  . Urinalysis, Routine w reflex microscopic     Need to obtain previous records Possible management options include: OTC analgesics Follow up as needed.         [name],Kylie Gros A 11/10/2013, 12:32 PM

## 2013-11-19 ENCOUNTER — Ambulatory Visit (INDEPENDENT_AMBULATORY_CARE_PROVIDER_SITE_OTHER): Payer: BC Managed Care – PPO | Admitting: Endocrinology

## 2013-11-19 ENCOUNTER — Encounter: Payer: Self-pay | Admitting: Endocrinology

## 2013-11-19 VITALS — BP 116/58 | HR 80 | Temp 98.9°F | Ht 70.0 in | Wt 183.0 lb

## 2013-11-19 DIAGNOSIS — E1065 Type 1 diabetes mellitus with hyperglycemia: Secondary | ICD-10-CM

## 2013-11-19 DIAGNOSIS — IMO0002 Reserved for concepts with insufficient information to code with codable children: Secondary | ICD-10-CM

## 2013-11-19 NOTE — Progress Notes (Signed)
Subjective:    Patient ID: Kristina Neal, female    DOB: 03-02-1976, 38 y.o.   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  no cbg record, but states cbg's are frequently mildly low after breakfast.  Otherwise, pt states she feels well in general.    Past Medical History  Diagnosis Date  . Diabetes mellitus without complication   . Asthma   . GERD (gastroesophageal reflux disease)   . Anemia     Past Surgical History  Procedure Laterality Date  . Cervical biopsy  w/ loop electrode excision    . Whipple procedure    . Cervical tumor removed    . Dilation and curettage of uterus  2012  . Leep    . Robot assisted myomectomy N/A 08/23/2013    Procedure: ROBOTIC ASSISTED MYOMECTOMY;  Surgeon: Lahoma Crocker, MD;  Location: [facility];  Service: Gynecology;  Laterality: N/A;    History   Social History  . Marital Status: Legally Separated    Spouse Name: N/A    Number of Children: N/A  . Years of Education: N/A   Occupational History  . Not on file.   Social History Main Topics  . Smoking status: Former Smoker    Types: Cigarettes    Quit date: 03/21/2006  . Smokeless tobacco: Never Used  . Alcohol Use: Yes     Comment: occaisionally  . Drug Use: No  . Sexual Activity: Yes    Partners: Male    Birth Control/ Protection: None   Other Topics Concern  . Not on file   Social History Narrative  . No narrative on file    Current Outpatient Prescriptions on File Prior to Visit  Medication Sig Dispense Refill  . acetaminophen (TYLENOL) 325 MG tablet Take 650 mg by mouth every 6 (six) hours as needed.      Marland Kitchen albuterol (PROVENTIL HFA;VENTOLIN HFA) 108 (90 BASE) MCG/ACT inhaler Inhale 1-2  puffs into the lungs every 6 (six) hours as needed for wheezing or shortness of breath.      . Mefenamic Acid 250 MG CAPS       . omeprazole (PRILOSEC) 40 MG capsule Take 40 mg by mouth daily as needed (for acid reflux).      . Prenatal Vit-Fe Fumarate-FA (PRENATAL MULTIVITAMIN) TABS tablet Take 1 tablet by mouth daily at 12 noon.      . vitamin C (ASCORBIC ACID) 500 MG tablet Take 500 mg by mouth daily.        No current facility-administered medications on file prior to visit.    Allergies  Allergen Reactions  . Sulfa Antibiotics Other (See Comments)    Causes headache.  . Amoxicillin Hives and Rash    Family History  Problem Relation Age of Onset  . Stroke Mother   . Hypertension Mother   . Diabetes Father   . Hypertension Maternal Grandmother   . Dementia Maternal Grandmother   . Cancer Maternal Grandfather     bone  . Diabetes Paternal Grandmother   . Hypertension Paternal Grandmother   . Cancer Paternal Grandfather     prostate   BP 116/58  Pulse 80  Temp(Src) 98.9 F (37.2 C) (Oral)  Ht 5\' 10"  (1.778 m)  Wt 183 lb (83.008 kg)  BMI 26.26 kg/m2  SpO2 98%  LMP 11/02/2013  Review of Systems Denies LOC and weight change.      Objective:   Physical Exam VITAL SIGNS:  See vs page GENERAL: no distress SKIN:  Insulin injection sites at the anterior abdomen are normal.  Lab Results  Component Value Date   HGBA1C 7.2* 10/03/2013      Assessment & Plan:  DM: glycemic control is improved.   Hypoglycemia: side-effect of insulin rx, new: mild.     Patient is advised the following: Patient Instructions  check your blood sugar twice a day.  vary the time of day when you check, between before the 3 meals, and at bedtime.  also check if you have symptoms of your blood sugar being too high or too low.  please keep a record of the readings and bring it to your next appointment here.  You can write it on any piece of paper.  please call us sooner if your blood sugar goes  below 70, or if you have a lot of readings over 200. In view of your medical condition, you should avoid pregnancy until we have decided it is safe.   Please decrease insulin to 20 units with breakfast, and 15 units with the evening meal.   Please come back for a follow-up appointment in 6 weeks.     A urine test is requested for you today.  We'll contact you with results.

## 2013-11-19 NOTE — Patient Instructions (Addendum)
check your blood sugar twice a day.  vary the time of day when you check, between before the 3 meals, and at bedtime.  also check if you have symptoms of your blood sugar being too high or too low.  please keep a record of the readings and bring it to your next appointment here.  You can write it on any piece of paper.  please call us sooner if your blood sugar goes below 70, or if you have a lot of readings over 200. In view of your medical condition, you should avoid pregnancy until we have decided it is safe.   Please decrease insulin to 20 units with breakfast, and 15 units with the evening meal.   Please come back for a follow-up appointment in 6 weeks.     A urine test is requested for you today.  We'll contact you with results.

## 2013-11-20 LAB — MICROALBUMIN / CREATININE URINE RATIO
CREATININE, URINE: 123.7 mg/dL
Microalb Creat Ratio: 4.1 mg/g (ref 0.0–30.0)
Microalb, Ur: 0.51 mg/dL (ref 0.00–1.89)

## 2013-11-28 ENCOUNTER — Ambulatory Visit (INDEPENDENT_AMBULATORY_CARE_PROVIDER_SITE_OTHER): Payer: BC Managed Care – PPO | Admitting: Obstetrics & Gynecology

## 2013-11-28 ENCOUNTER — Encounter: Payer: Self-pay | Admitting: Obstetrics & Gynecology

## 2013-11-28 VITALS — BP 99/64 | HR 76 | Temp 98.7°F | Ht 70.0 in | Wt 190.0 lb

## 2013-11-28 DIAGNOSIS — Z09 Encounter for follow-up examination after completed treatment for conditions other than malignant neoplasm: Secondary | ICD-10-CM

## 2013-11-28 NOTE — Patient Instructions (Signed)
Insomnia Insomnia is frequent trouble falling and/or staying asleep. Insomnia can be a long term problem or a short term problem. Both are common. Insomnia can be a short term problem when the wakefulness is related to a certain stress or worry. Long term insomnia is often related to ongoing stress during waking hours and/or poor sleeping habits. Overtime, sleep deprivation itself can make the problem worse. Every little thing feels more severe because you are overtired and your ability to cope is decreased. CAUSES   Stress, anxiety, and depression.  Poor sleeping habits.  Distractions such as TV in the bedroom.  Naps close to bedtime.  Engaging in emotionally charged conversations before bed.  Technical reading before sleep.  Alcohol and other sedatives. They may make the problem worse. They can hurt normal sleep patterns and normal dream activity.  Stimulants such as caffeine for several hours prior to bedtime.  Pain syndromes and shortness of breath can cause insomnia.  Exercise late at night.  Changing time zones may cause sleeping problems (jet lag). It is sometimes helpful to have someone observe your sleeping patterns. They should look for periods of not breathing during the night (sleep apnea). They should also look to see how long those periods last. If you live alone or observers are uncertain, you can also be observed at a sleep clinic where your sleep patterns will be professionally monitored. Sleep apnea requires a checkup and treatment. Give your caregivers your medical history. Give your caregivers observations your family has made about your sleep.  SYMPTOMS   Not feeling rested in the morning.  Anxiety and restlessness at bedtime.  Difficulty falling and staying asleep. TREATMENT   Your caregiver may prescribe treatment for an underlying medical disorders. Your caregiver can give advice or help if you are using alcohol or other drugs for self-medication. Treatment  of underlying problems will usually eliminate insomnia problems.  Medications can be prescribed for short time use. They are generally not recommended for lengthy use.  Over-the-counter sleep medicines are not recommended for lengthy use. They can be habit forming.  You can promote easier sleeping by making lifestyle changes such as:  Using relaxation techniques that help with breathing and reduce muscle tension.  Exercising earlier in the day.  Changing your diet and the time of your last meal. No night time snacks.  Establish a regular time to go to bed.  Counseling can help with stressful problems and worry.  Soothing music and white noise may be helpful if there are background noises you cannot remove.  Stop tedious detailed work at least one hour before bedtime. HOME CARE INSTRUCTIONS   Keep a diary. Inform your caregiver about your progress. This includes any medication side effects. See your caregiver regularly. Take note of:  Times when you are asleep.  Times when you are awake during the night.  The quality of your sleep.  How you feel the next day. This information will help your caregiver care for you.  Get out of bed if you are still awake after 15 minutes. Read or do some quiet activity. Keep the lights down. Wait until you feel sleepy and go back to bed.  Keep regular sleeping and waking hours. Avoid naps.  Exercise regularly.  Avoid distractions at bedtime. Distractions include watching television or engaging in any intense or detailed activity like attempting to balance the household checkbook.  Develop a bedtime ritual. Keep a familiar routine of bathing, brushing your teeth, climbing into bed at the same   time each night, listening to soothing music. Routines increase the success of falling to sleep faster.  Use relaxation techniques. This can be using breathing and muscle tension release routines. It can also include visualizing peaceful scenes. You can  also help control troubling or intruding thoughts by keeping your mind occupied with boring or repetitive thoughts like the old concept of counting sheep. You can make it more creative like imagining planting one beautiful flower after another in your backyard garden.  During your day, work to eliminate stress. When this is not possible use some of the previous suggestions to help reduce the anxiety that accompanies stressful situations. MAKE SURE YOU:   Understand these instructions.  Will watch your condition.  Will get help right away if you are not doing well or get worse. Document Released: 03/04/2000 Document Revised: 05/30/2011 Document Reviewed: 04/04/2007 [employer] Patient Information 2015 [employer]. This information is not intended to replace advice given to you by your health care provider. Make sure you discuss any questions you have with your health care provider.  

## 2013-11-29 ENCOUNTER — Telehealth: Payer: Self-pay

## 2013-11-29 NOTE — Telephone Encounter (Signed)
Lvom advising pt of lab results. Requested call back if she would like to discuss.

## 2013-11-29 NOTE — Telephone Encounter (Signed)
Pt called with blood sugar readings from this morning. This morning at 8:15 her blood sugar was 48. Pt states that she had a normal breakfast which consisted for oatmeal, eggs and 10z of orange juice. Pt had hard candies and checked her blood sugar every 30 mins until it got back in the 70's. Readings were 51, 61, and 71. Pt states she took her 20 units of Humulin this morning and has ben taking the 15 units as prescribed. I asked her if this is the first time that her blood sugar had dropped low. She stated it was the first time she was able to record the readings. She states she feels like it has dropped like this 2 to 3 times since her last office visit.  Please advise, Thanks!

## 2013-11-29 NOTE — Telephone Encounter (Signed)
please call patient: Please decrease insulin to 20 units with breakfast, and 10 units with the evening meal.

## 2013-12-01 ENCOUNTER — Encounter: Payer: Self-pay | Admitting: Obstetrics & Gynecology

## 2013-12-01 NOTE — Progress Notes (Signed)
Subjective:     Kristina Neal is a 38 y.o. female who presents to the clinic 3 months status post myomectomy for fibroids. Eating a regular diet without difficulty. Bowel movements are normal. The patient reports occasional pain at the umbilicus incision.  The following portions of the patient's history were reviewed and updated as appropriate: allergies, current medications, past family history, past medical history, past social history, past surgical history and problem list.  Review of Systems Pertinent items are noted in HPI.    Objective:    BP 99/64  Pulse 76  Temp(Src) 98.7 F (37.1 C)  Ht 5\' 10"  (1.778 m)  Wt 86.183 kg (190 lb)  BMI 27.26 kg/m2  LMP 11/02/2013 General:  alert  Abdomen: soft, bowel sounds active, non-tender  Incision:   healing well, no drainage, no erythema, no hernia, no seroma, no swelling, no dehiscence, incision well approximated; no hernias    Pelvic: uterus anteverted, NT; no adnexal masses   Assessment:    Doing well postoperatively. Doubt incisional hernia; ?neuropathic pain Plan:   Continue any current medications. NSAIDS prn Follow up: 3 months or prn

## 2013-12-09 ENCOUNTER — Telehealth: Payer: Self-pay | Admitting: *Deleted

## 2013-12-09 NOTE — Telephone Encounter (Signed)
Patient states she is having to miss work with her cycles due to nausea. Patient  Request FMLA. 6:34 LM on VM to CB- will need to speak to patient regarding how many hours she is missing and what we need to cover. Doctor will have to OK.

## 2013-12-12 ENCOUNTER — Ambulatory Visit (INDEPENDENT_AMBULATORY_CARE_PROVIDER_SITE_OTHER): Payer: BC Managed Care – PPO | Admitting: Family Medicine

## 2013-12-12 ENCOUNTER — Encounter: Payer: Self-pay | Admitting: Family Medicine

## 2013-12-12 VITALS — BP 110/64 | HR 82 | Temp 98.0°F | Resp 16 | Wt 192.1 lb

## 2013-12-12 DIAGNOSIS — N926 Irregular menstruation, unspecified: Secondary | ICD-10-CM | POA: Insufficient documentation

## 2013-12-12 DIAGNOSIS — G43909 Migraine, unspecified, not intractable, without status migrainosus: Secondary | ICD-10-CM | POA: Insufficient documentation

## 2013-12-12 DIAGNOSIS — J322 Chronic ethmoidal sinusitis: Secondary | ICD-10-CM | POA: Insufficient documentation

## 2013-12-12 DIAGNOSIS — J0120 Acute ethmoidal sinusitis, unspecified: Secondary | ICD-10-CM

## 2013-12-12 LAB — HCG, QUANTITATIVE, PREGNANCY: Quantitative HCG: 0.26 m[IU]/mL

## 2013-12-12 MED ORDER — AZITHROMYCIN 250 MG PO TABS: ORAL_TABLET | ORAL | Status: DC

## 2013-12-12 NOTE — Progress Notes (Signed)
Pre visit review using our clinic review tool, if applicable. No additional management support is needed unless otherwise documented below in the visit note. 

## 2013-12-12 NOTE — Patient Instructions (Signed)
Follow up as needed Start the Zpack as directed Add Claritin or Zyrtec daily for the allergy component Drink plenty fluids REST! We'll notify you of your lab results and determine the next steps Call with any questions or concerns Hang in there!!!

## 2013-12-12 NOTE — Assessment & Plan Note (Signed)
New.  Pt's Upreg is negative but pt reports she has a hx of pregnancy w/o negative Upreg.  Check HCG.

## 2013-12-12 NOTE — Progress Notes (Signed)
   Subjective:    Patient ID: Kristina Neal, female    DOB: 25-Dec-1975, 38 y.o.   MRN: [id number]  HPI Migraine- pt has hx of similar.  sxs started ~7pm Sunday night.  + photo and phonophobia.  + nausea.  + dizziness.  sxs started to improve after 2 hrs and didn't resolve until Monday night.  Currently having frontal HA.  No nasal congestion.  No ear pain.  + sneezing, PND.  Irregular cycle- pt reports initially cycle was late (3 days) and then was 'extremely short' (4 days long).  No spotting.  + increased appetite- craved sweet and salty.  1st 2 days of cycle 'was extremely naseous'  Pt concerned for pregnancy.   Review of Systems For ROS see HPI     Objective:   Physical Exam  Vitals reviewed. Constitutional: She appears well-developed and well-nourished. No distress.  HENT:  Head: Normocephalic and atraumatic.  Right Ear: Tympanic membrane normal.  Left Ear: Tympanic membrane normal.  Nose: Mucosal edema and rhinorrhea present. Right sinus exhibits maxillary sinus tenderness (TTP behind eyes). Right sinus exhibits no frontal sinus tenderness. Left sinus exhibits maxillary sinus tenderness (TTP behind eyes). Left sinus exhibits no frontal sinus tenderness.  Mouth/Throat: Uvula is midline and mucous membranes are normal. Posterior oropharyngeal erythema present. No oropharyngeal exudate.  Eyes: Conjunctivae and EOM are normal. Pupils are equal, round, and reactive to light.  Neck: Normal range of motion. Neck supple.  Cardiovascular: Normal rate, regular rhythm and normal heart sounds.   Pulmonary/Chest: Effort normal and breath sounds normal. No respiratory distress. She has no wheezes.  Lymphadenopathy:    She has no cervical adenopathy.          Assessment & Plan:

## 2013-12-15 NOTE — Assessment & Plan Note (Signed)
Pt has hx of similar.  Suspect her current sxs are due to sinus infxn.  tx w/ abx.  Also start antihistamine for underlying seasonal allergies.  Reviewed supportive care and red flags that should prompt return.  Pt expressed understanding and is in agreement w/ plan.

## 2013-12-15 NOTE — Assessment & Plan Note (Signed)
New.  Pt's sxs and PE consistent w/ infxn.  Start abx- will choose ZPack due to PCN allergy and possible pregnancy.  Reviewed supportive care and red flags that should prompt return.  Pt expressed understanding and is in agreement w/ plan.

## 2013-12-19 NOTE — Telephone Encounter (Signed)
Patient came to office today- She states she did well with her first cycle after the surgery- but since then she has suffered with extreme nausea and vomiting and pain with her cycle so bad that she misses 2-3 days of work. Patient is not using anything to prevent pregnancy- but is not actively trying either. Reviewed use of OCP for cycle control , also discussed use of Ibuprofen during cycle to control bleeding and cramping. Patient will consider these options. Patient to fax FMLA paper work to the office to be filled out and reviewed by the doctor.

## 2013-12-25 ENCOUNTER — Other Ambulatory Visit: Payer: Self-pay | Admitting: *Deleted

## 2013-12-25 ENCOUNTER — Telehealth: Payer: Self-pay | Admitting: *Deleted

## 2013-12-25 DIAGNOSIS — R109 Unspecified abdominal pain: Secondary | ICD-10-CM

## 2013-12-25 NOTE — Telephone Encounter (Signed)
Patient request a study to assess her pain- ? If she has a hernia. Per Dr Delsa Sale- order CT scan. Patient notified. Patient also is sending paperwork for FMLA.

## 2013-12-26 ENCOUNTER — Telehealth: Payer: Self-pay

## 2013-12-26 NOTE — Telephone Encounter (Signed)
eft message regarding appt date and time at [facility] for ultrasound - 10/9/ at 2pm - patient needs to pick-up contrast today and arrive there by 1:30pm on Fri.

## 2013-12-27 ENCOUNTER — Ambulatory Visit (HOSPITAL_COMMUNITY): Payer: BC Managed Care – PPO

## 2013-12-31 ENCOUNTER — Ambulatory Visit (INDEPENDENT_AMBULATORY_CARE_PROVIDER_SITE_OTHER): Payer: BC Managed Care – PPO | Admitting: Endocrinology

## 2013-12-31 ENCOUNTER — Encounter: Payer: Self-pay | Admitting: Endocrinology

## 2013-12-31 VITALS — BP 118/78 | HR 71 | Temp 98.6°F | Ht 70.0 in | Wt 196.0 lb

## 2013-12-31 DIAGNOSIS — IMO0002 Reserved for concepts with insufficient information to code with codable children: Secondary | ICD-10-CM

## 2013-12-31 DIAGNOSIS — E1065 Type 1 diabetes mellitus with hyperglycemia: Secondary | ICD-10-CM

## 2013-12-31 LAB — HEMOGLOBIN A1C: HEMOGLOBIN A1C: 6.3 % (ref 4.6–6.5)

## 2013-12-31 NOTE — Telephone Encounter (Signed)
FMLA paper work filled out for 6 month period- patient has follow up appointment 02/27/2014. OK per Dr Delsa Sale

## 2013-12-31 NOTE — Patient Instructions (Addendum)
check your blood sugar twice a day.  vary the time of day when you check, between before the 3 meals, and at bedtime.  also check if you have symptoms of your blood sugar being too high or too low.  please keep a record of the readings and bring it to your next appointment here.  You can write it on any piece of paper.  please call us sooner if your blood sugar goes below 70, or if you have a lot of readings over 200. In view of your medical condition, you should avoid pregnancy until we have decided it is safe.   Please continue insulin to 20 units with breakfast, and 10 units with the evening meal.   blood tests are being requested for you today.  We'll contact you with results. Based on the results, we'll change to the once-a-day insulin. Please come back for a follow-up appointment in 1 month.

## 2013-12-31 NOTE — Progress Notes (Signed)
Subjective:    Patient ID: Kristina Neal, female    DOB: May 21, 1975, 38 y.o.   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 Severe hypoglycemia: never Pancreatitis: never Other:  Interval history:  no cbg record, but states cbg's are variable.  She has mild hypoglycemia approx twice a week.  This can happen at any time of day.  She misses 2-3 insulin doses per week.  She says it is otherwise well-controlled. She thinks the insulin is 20 am and 10 pm, but she is not sure.  She wants to decrease the insulin to qd if possible. Past Medical History  Diagnosis Date  . Diabetes mellitus without complication   . Asthma   . GERD (gastroesophageal reflux disease)   . Anemia     Past Surgical History  Procedure Laterality Date  . Cervical biopsy  w/ loop electrode excision    . Whipple procedure    . Cervical tumor removed    . Dilation and curettage of uterus  2012  . Leep    . Robot assisted myomectomy N/A 08/23/2013    Procedure: ROBOTIC ASSISTED MYOMECTOMY;  Surgeon: Lahoma Crocker, MD;  Location: [facility];  Service: Gynecology;  Laterality: N/A;    History   Social History  . Marital Status: Legally Separated    Spouse Name: N/A    Number of Children: N/A  . Years of Education: N/A   Occupational History  . Not on file.   Social History Main Topics  . Smoking status: Former Smoker    Types: Cigarettes    Quit date: 03/21/2006  . Smokeless tobacco: Never Used  . Alcohol Use: Yes     Comment: occaisionally  . Drug Use: No  . Sexual Activity: Yes    Partners: Male    Birth Control/ Protection: None   Other Topics Concern  . Not on file   Social History Narrative  . No narrative on file    Current Outpatient Prescriptions on File Prior to  Visit  Medication Sig Dispense Refill  . acetaminophen (TYLENOL) 325 MG tablet Take 650 mg by mouth every 6 (six) hours as needed.      Marland Kitchen albuterol (PROVENTIL HFA;VENTOLIN HFA) 108 (90 BASE) MCG/ACT inhaler Inhale 1-2 puffs into the lungs every 6 (six) hours as needed for wheezing or shortness of breath.      Marland Kitchen azithromycin (ZITHROMAX) 250 MG tablet 2 tabs on day 1, 1 tab on day 2-5  6 tablet  0  . folic acid (FOLVITE) 1 MG tablet Take 1 mg by mouth daily.      . insulin NPH-regular Human (NOVOLIN 70/30) (70-30) 100 UNIT/ML injection , and syringes twice a day      . Mefenamic Acid 250 MG CAPS       . omeprazole (PRILOSEC) 40 MG capsule Take 40 mg by mouth daily as needed (for acid reflux).      . Prenatal Vit-Fe Fumarate-FA (PRENATAL MULTIVITAMIN) TABS tablet Take 1 tablet by mouth daily at 12 noon.      . vitamin C (ASCORBIC ACID) 500 MG tablet Take 500 mg by mouth daily.        No current facility-administered medications on file prior to visit.    Allergies  Allergen Reactions  .  Sulfa Antibiotics Other (See Comments)    Causes headache.  . Amoxicillin Hives and Rash    Family History  Problem Relation Age of Onset  . Stroke Mother   . Hypertension Mother   . Diabetes Father   . Hypertension Maternal Grandmother   . Dementia Maternal Grandmother   . Cancer Maternal Grandfather     bone  . Diabetes Paternal Grandmother   . Hypertension Paternal Grandmother   . Cancer Paternal Grandfather     prostate    BP 118/78  Pulse 71  Temp(Src) 98.6 F (37 C) (Oral)  Ht 5\' 10"  (1.778 m)  Wt 196 lb (88.905 kg)  BMI 28.12 kg/m2  SpO2 98%    Review of Systems She has migraine headache now.  Denies LOC.      Objective:   Physical Exam VITAL SIGNS:  See vs page. GENERAL: no distress. Pulses: dorsalis pedis intact bilat.   Feet: no deformity.  no edema. Skin:  no ulcer on the feet.  normal color and temp. Neuro: sensation is intact to touch on the feet.    Lab Results    Component Value Date   HGBA1C 6.3 12/31/2013       Assessment & Plan:  DM: overcontrolled.   Side-effect of rx: hypoglycemia.   Noncompliance with cbg recording: I'll work around this as best I can   Patient is advised the following: Patient Instructions  check your blood sugar twice a day.  vary the time of day when you check, between before the 3 meals, and at bedtime.  also check if you have symptoms of your blood sugar being too high or too low.  please keep a record of the readings and bring it to your next appointment here.  You can write it on any piece of paper.  please call us sooner if your blood sugar goes below 70, or if you have a lot of readings over 200. In view of your medical condition, you should avoid pregnancy until we have decided it is safe.   Please continue insulin to 20 units with breakfast, and 10 units with the evening meal.   blood tests are being requested for you today.  We'll contact you with results. Based on the results, we'll change to the once-a-day insulin. Please come back for a follow-up appointment in 1 month.       addendum: reduce insulin to 20 am and 5 pm.  Pt declines to change to qd insulin for now.

## 2014-01-02 ENCOUNTER — Ambulatory Visit (HOSPITAL_COMMUNITY): Payer: BC Managed Care – PPO

## 2014-01-06 ENCOUNTER — Other Ambulatory Visit (HOSPITAL_COMMUNITY): Payer: BC Managed Care – PPO

## 2014-01-09 ENCOUNTER — Encounter: Payer: Self-pay | Admitting: Internal Medicine

## 2014-01-13 ENCOUNTER — Encounter: Payer: Self-pay | Admitting: *Deleted

## 2014-01-14 ENCOUNTER — Encounter: Payer: Self-pay | Admitting: Internal Medicine

## 2014-01-14 ENCOUNTER — Other Ambulatory Visit: Payer: BC Managed Care – PPO

## 2014-01-14 ENCOUNTER — Ambulatory Visit (INDEPENDENT_AMBULATORY_CARE_PROVIDER_SITE_OTHER): Payer: BC Managed Care – PPO | Admitting: Internal Medicine

## 2014-01-14 VITALS — BP 100/64 | HR 64 | Ht 68.5 in | Wt 188.1 lb

## 2014-01-14 DIAGNOSIS — Z90411 Acquired partial absence of pancreas: Secondary | ICD-10-CM

## 2014-01-14 DIAGNOSIS — K8689 Other specified diseases of pancreas: Secondary | ICD-10-CM

## 2014-01-14 DIAGNOSIS — K903 Pancreatic steatorrhea: Secondary | ICD-10-CM

## 2014-01-14 DIAGNOSIS — K868 Other specified diseases of pancreas: Secondary | ICD-10-CM

## 2014-01-14 MED ORDER — PANCRELIPASE (LIP-PROT-AMYL) 36000-114000 UNITS PO CPEP: 72000.0000 [IU] | ORAL_CAPSULE | Freq: Three times a day (TID) | ORAL | Status: DC

## 2014-01-14 NOTE — Progress Notes (Signed)
Kristina Neal 1976-03-01 [id number]  Note: This dictation was prepared with Dragon digital system. Any transcriptional errors that result from this procedure are unintentional.   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 She lost about 100 pounds from 280 pounds to 188 pounds. She ran out of pancreatic enzyme supplements because her insurance didn't cover and cost for several hundred dollars. She is now complaining of diarrhea and fatty, greasy stools. She is having loose stool within 10 minutes of a meal. She denies abdominal pain but has constant nausea. She denies heartburn or dysphagia. She is mildly anemic , she  has been on B12 supplements.     Past Medical History  Diagnosis Date  . Diabetes mellitus without complication   . Asthma   . GERD (gastroesophageal reflux disease)   . Anemia     Past Surgical History  Procedure Laterality Date  . Cervical biopsy  w/ loop electrode excision    . Whipple procedure  2008  . Cervical tumor removed    . Dilation and curettage of uterus  2012  . Leep    . Robot assisted myomectomy N/A 08/23/2013    Procedure: ROBOTIC ASSISTED MYOMECTOMY;  Surgeon: Lahoma Crocker, MD;  Location: [facility];  Service: Gynecology;  Laterality: N/A;    Allergies  Allergen Reactions  . Sulfa Antibiotics Other (See Comments)    Causes headache.  . Amoxicillin Hives and Rash    Family history and social history have been reviewed.  Review of Systems: Positive for nausea. Negative for vomiting. Positive for weight loss. Positive for diarrhea  The remainder of the 10 point ROS is negative except as outlined in the H&P  Physical Exam: General Appearance Well developed, in no distress Eyes  Non icteric  HEENT  Non traumatic, normocephalic  Mouth No  lesion, tongue papillated, no cheilosis Neck Supple without adenopathy, thyroid not enlarged, no carotid bruits, no JVD Lungs Clear to auscultation bilaterally COR Normal S1, normal S2, regular rhythm, no murmur, quiet precordium Abdomen Soft nontender with normoactive bowel sounds. Tenderness in left lower and middle quadrant. 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 She needs to get back on pancreatic enzyme replacement. We will obtain a stool for [place] stain and fibers and we will start her on Creon 36,000 units per capsule, take 2 capsules 3 times a day with each meal and one capsule with snacks. I will see her in 4-6 weeks. We will also obtain a gastric emptying scan because of her early satiety and regurgitation, r/o diabetic gastroparesis. I have asked her to start taking over-the-counter iron supplements because of mild anemia. She will continue B12 supplements. We also need a bone density to rule out osteopenia secondary to vitamin D malabsorption. I will see her in 4-6 weeks and we can discuss this at that time.    Delfin Edis 01/14/2014

## 2014-01-14 NOTE — Patient Instructions (Addendum)
You have been scheduled for a gastric emptying scan at [facility] on 01/21/14 at 9:30 am. Please arrive at least 15 minutes prior to your appointment for registration. Please make certain not to have anything to eat or drink after midnight the night before your test. Hold all stomach medications (ex: Zofran, phenergan, Reglan) 48 hours prior to your test. Do not take any Prilosec after midnight. If you need to reschedule your appointment, please contact radiology scheduling at [phone]. _____________________________________________________________________ A gastric-emptying study measures how long it takes for food to move through your stomach. There are several ways to measure stomach emptying. In the most common test, you eat food that contains a small amount of radioactive material. A scanner that detects the movement of the radioactive material is placed over your abdomen to monitor the rate at which food leaves your stomach. This test normally takes about 2 hours to complete. _____________________________________________________________________ Your physician has requested that you go to the basement for the following lab work before leaving today: Stool fats/fibers  We have sent the following medications to your pharmacy for you to pick up at your convenience: Creon 36,000 units-Take 2 tablets by mouth three times daily  You have been scheduled for a follow up appointment with Dr Olevia Perches on 02/18/14 @ 2:30 pm.  CC:Dr Annye Asa, Dr Renato Shin

## 2014-01-15 ENCOUNTER — Other Ambulatory Visit: Payer: BC Managed Care – PPO

## 2014-01-15 DIAGNOSIS — K8689 Other specified diseases of pancreas: Secondary | ICD-10-CM

## 2014-01-17 LAB — FECAL FAT/MUSCLE FIBERS, QUAL
FAT QUAL NEUTRAL STL: INCREASED
FAT QUAL TOTAL STL: INCREASED
MUSCLE FIBER, STOOL: NORMAL

## 2014-01-20 ENCOUNTER — Encounter: Payer: Self-pay | Admitting: Internal Medicine

## 2014-01-21 ENCOUNTER — Encounter (HOSPITAL_COMMUNITY): Payer: Self-pay

## 2014-01-21 ENCOUNTER — Ambulatory Visit (HOSPITAL_COMMUNITY)
Admission: RE | Admit: 2014-01-21 | Discharge: 2014-01-21 | Disposition: A | Discharge status: Home / Self Care | Payer: BC Managed Care – PPO | Source: Ambulatory Visit | Attending: Diagnostic Radiology | Admitting: Diagnostic Radiology

## 2014-01-21 DIAGNOSIS — R110 Nausea: Secondary | ICD-10-CM | POA: Insufficient documentation

## 2014-01-21 DIAGNOSIS — K219 Gastro-esophageal reflux disease without esophagitis: Secondary | ICD-10-CM | POA: Insufficient documentation

## 2014-01-21 DIAGNOSIS — K8689 Other specified diseases of pancreas: Secondary | ICD-10-CM

## 2014-01-21 DIAGNOSIS — R6881 Early satiety: Secondary | ICD-10-CM | POA: Insufficient documentation

## 2014-01-21 DIAGNOSIS — R140 Abdominal distension (gaseous): Secondary | ICD-10-CM | POA: Diagnosis not present

## 2014-01-21 DIAGNOSIS — K868 Other specified diseases of pancreas: Secondary | ICD-10-CM | POA: Insufficient documentation

## 2014-01-21 MED ORDER — TECHNETIUM TC 99M SULFUR COLLOID
2.0000 | Freq: Once | INTRAVENOUS | Status: AC | PRN
  Administered 2014-01-21: 2 via INTRAVENOUS

## 2014-01-22 ENCOUNTER — Ambulatory Visit (INDEPENDENT_AMBULATORY_CARE_PROVIDER_SITE_OTHER): Payer: BC Managed Care – PPO | Admitting: Family Medicine

## 2014-01-22 ENCOUNTER — Encounter: Payer: Self-pay | Admitting: Family Medicine

## 2014-01-22 VITALS — BP 110/82 | HR 84 | Temp 98.0°F | Resp 16 | Wt 198.0 lb

## 2014-01-22 DIAGNOSIS — N644 Mastodynia: Secondary | ICD-10-CM

## 2014-01-22 DIAGNOSIS — F32A Depression, unspecified: Secondary | ICD-10-CM | POA: Insufficient documentation

## 2014-01-22 DIAGNOSIS — F329 Major depressive disorder, single episode, unspecified: Secondary | ICD-10-CM

## 2014-01-22 DIAGNOSIS — Z0000 Encounter for general adult medical examination without abnormal findings: Secondary | ICD-10-CM | POA: Insufficient documentation

## 2014-01-22 LAB — CBC WITH DIFFERENTIAL/PLATELET
Basophils Absolute: 0 10*3/uL (ref 0.0–0.1)
Basophils Relative: 0.2 % (ref 0.0–3.0)
EOS ABS: 0.1 10*3/uL (ref 0.0–0.7)
Eosinophils Relative: 1.5 % (ref 0.0–5.0)
HCT: 35.8 % — ABNORMAL LOW (ref 36.0–46.0)
Hemoglobin: 11.6 g/dL — ABNORMAL LOW (ref 12.0–15.0)
Lymphocytes Relative: 23.5 % (ref 12.0–46.0)
Lymphs Abs: 1.6 10*3/uL (ref 0.7–4.0)
MCHC: 32.6 g/dL (ref 30.0–36.0)
MCV: 88.8 fl (ref 78.0–100.0)
MONO ABS: 0.3 10*3/uL (ref 0.1–1.0)
Monocytes Relative: 5.2 % (ref 3.0–12.0)
NEUTROS ABS: 4.6 10*3/uL (ref 1.4–7.7)
NEUTROS PCT: 69.6 % (ref 43.0–77.0)
Platelets: 166 10*3/uL (ref 150.0–400.0)
RBC: 4.03 Mil/uL (ref 3.87–5.11)
RDW: 14.4 % (ref 11.5–15.5)
WBC: 6.7 10*3/uL (ref 4.0–10.5)

## 2014-01-22 NOTE — Assessment & Plan Note (Signed)
New.  Pt reports that she has hx of this and has previously been on medication.  Pt is willing to restart this provided that her pregnancy test is negative.  Will follow closely.

## 2014-01-22 NOTE — Patient Instructions (Signed)
Follow up in 4-6 weeks to recheck mood If the labs are all normal, we'll start the Prozac Ask Dr Delsa Sale about the possibility of PCOS Try and make healthy food choices and get regular exercise- not only good for diabetes but good for stress relief Call with any questions or concerns Happy [birth year][employer] Birthday!!

## 2014-01-22 NOTE — Assessment & Plan Note (Signed)
Pt's PE WNL w/ exception of obesity and hirsutism.  Given pt's report of breast tenderness and nausea w/o + pregnancy test, wonder if pt has PCOS or other hormonal abnormality.  Encouraged her to discuss this w/ GYN and/or Endo.  Check labs.  Anticipatory guidance provided.

## 2014-01-22 NOTE — Progress Notes (Signed)
   Subjective:    Patient ID: Kristina Neal, female    DOB: [birth year], 38 y.o.   MRN: [id number]  HPI CPE- UTD on GYN ([name]), Endo- [name], [name]   Review of Systems Patient reports no vision/ hearing changes, adenopathy,fever, weight change,  persistant/recurrent hoarseness , swallowing issues, chest pain, palpitations, edema, persistant/recurrent cough, hemoptysis, dyspnea (rest/exertional/paroxysmal nocturnal), gastrointestinal bleeding (melena, rectal bleeding), abdominal pain, significant heartburn, bowel changes, GU symptoms (dysuria, hematuria, incontinence), Gyn symptoms (abnormal  bleeding, pain),  syncope, focal weakness, memory loss, numbness & tingling, skin/hair/nail changes, abnormal bruising or bleeding.   + nausea in response to smells + breast tenderness + depression- 'mood swings'.  'if i'm not pregnant, i need to do something about'.    Objective:   Physical Exam General Appearance:    Alert, cooperative, no distress, appears stated age  Head:    Normocephalic, without obvious abnormality, atraumatic  Eyes:    PERRL, conjunctiva/corneas clear, EOM's intact, fundi    benign, both eyes  Ears:    Normal TM's and external ear canals, both ears  Nose:   Nares normal, septum midline, mucosa normal, no drainage    or sinus tenderness  Throat:   Lips, mucosa, and tongue normal; teeth and gums normal  Neck:   Supple, symmetrical, trachea midline, no adenopathy;    Thyroid: no enlargement/tenderness/nodules  Back:     Symmetric, no curvature, ROM normal, no CVA tenderness  Lungs:     Clear to auscultation bilaterally, respirations unlabored  Chest Wall:    No tenderness or deformity   Heart:    Regular rate and rhythm, S1 and S2 normal, no murmur, rub   or gallop  Breast Exam:    Deferred to GYN  Abdomen:     Soft, non-tender, bowel sounds active all four quadrants,    no masses, no organomegaly  Genitalia:    Deferred to GYN  Rectal:      Extremities:   Extremities normal, atraumatic, no cyanosis or edema  Pulses:   2+ and symmetric all extremities  Skin:   Skin color, texture, turgor normal, no rashes or lesions, hirsute  Lymph nodes:   Cervical, supraclavicular, and axillary nodes normal  Neurologic:   CNII-XII intact, normal strength, sensation and reflexes    throughout          Assessment & Plan:

## 2014-01-22 NOTE — Progress Notes (Signed)
Pre visit review using our clinic review tool, if applicable. No additional management support is needed unless otherwise documented below in the visit note. 

## 2014-01-23 ENCOUNTER — Other Ambulatory Visit: Payer: Self-pay | Admitting: General Practice

## 2014-01-23 ENCOUNTER — Telehealth: Payer: Self-pay | Admitting: Family Medicine

## 2014-01-23 LAB — LIPID PANEL
CHOLESTEROL: 166 mg/dL (ref 0–200)
HDL: 58.4 mg/dL (ref >39.00)
LDL CALC: 102 mg/dL — AB (ref 0–99)
NonHDL: 107.6
TRIGLYCERIDES: 27 mg/dL (ref 0.0–149.0)
Total CHOL/HDL Ratio: 3
VLDL: 5.4 mg/dL (ref 0.0–40.0)

## 2014-01-23 LAB — BASIC METABOLIC PANEL
BUN: 6 mg/dL (ref 6–23)
CHLORIDE: 106 meq/L (ref 96–112)
CO2: 24 mEq/L (ref 19–32)
Calcium: 9 mg/dL (ref 8.4–10.5)
Creatinine, Ser: 0.7 mg/dL (ref 0.4–1.2)
GFR: 131.18 mL/min (ref >60.00)
GLUCOSE: 113 mg/dL — AB (ref 70–99)
POTASSIUM: 3.9 meq/L (ref 3.5–5.1)
SODIUM: 141 meq/L (ref 135–145)

## 2014-01-23 LAB — HEPATIC FUNCTION PANEL
ALK PHOS: 48 U/L (ref 39–117)
ALT: 19 U/L (ref 0–35)
AST: 29 U/L (ref 0–37)
Albumin: 3.6 g/dL (ref 3.5–5.2)
BILIRUBIN DIRECT: 0.1 mg/dL (ref 0.0–0.3)
TOTAL PROTEIN: 7.1 g/dL (ref 6.0–8.3)
Total Bilirubin: 0.3 mg/dL (ref 0.2–1.2)

## 2014-01-23 LAB — TSH: TSH: 1.44 u[IU]/mL (ref 0.35–4.50)

## 2014-01-23 LAB — HCG, QUANTITATIVE, PREGNANCY: QUANTITATIVE HCG: 0.38 m[IU]/mL

## 2014-01-23 LAB — VITAMIN D 25 HYDROXY (VIT D DEFICIENCY, FRACTURES): VITD: 11.52 ng/mL — ABNORMAL LOW (ref 30.00–100.00)

## 2014-01-23 MED ORDER — VITAMIN D (ERGOCALCIFEROL) 1.25 MG (50000 UNIT) PO CAPS: 50000.0000 [IU] | ORAL_CAPSULE | ORAL | Status: DC

## 2014-01-23 MED ORDER — FLUOXETINE HCL 20 MG PO TABS: 20.0000 mg | ORAL_TABLET | Freq: Every day | ORAL | Status: DC

## 2014-01-23 NOTE — Telephone Encounter (Signed)
Pt notified of results

## 2014-01-23 NOTE — Telephone Encounter (Signed)
Results are not back yet.  

## 2014-01-23 NOTE — Telephone Encounter (Signed)
Pt would like to know if her results are in from labs done yesterday.

## 2014-01-31 ENCOUNTER — Encounter: Payer: Self-pay | Admitting: Endocrinology

## 2014-01-31 ENCOUNTER — Ambulatory Visit (INDEPENDENT_AMBULATORY_CARE_PROVIDER_SITE_OTHER): Payer: BC Managed Care – PPO | Admitting: Endocrinology

## 2014-01-31 VITALS — BP 118/60 | HR 74 | Temp 99.1°F | Ht 68.5 in | Wt 196.0 lb

## 2014-01-31 DIAGNOSIS — IMO0002 Reserved for concepts with insufficient information to code with codable children: Secondary | ICD-10-CM

## 2014-01-31 DIAGNOSIS — E1065 Type 1 diabetes mellitus with hyperglycemia: Secondary | ICD-10-CM

## 2014-01-31 MED ORDER — INSULIN DETEMIR 100 UNIT/ML ~~LOC~~ SOLN: 30.0000 [IU] | SUBCUTANEOUS | Status: DC

## 2014-01-31 NOTE — Progress Notes (Signed)
Subjective:    Patient ID: Kristina Neal, female    DOB: 1975-05-01, 38 y.o.   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   Dx'ed: 1443 Complications:  Therapy: insulin since 2011, when she had DKA. GDM: never DKA: as above, and again in 2014. Severe hypoglycemia: never Pancreatitis: never.   Other: due to noncompliance, she is on a simple insulin schedule. Interval history:  She still misses 4-5 doses of insulin per week.  no cbg record, but states cbg's are well-controlled.   Past Medical History  Diagnosis Date  . Diabetes mellitus without complication   . Asthma   . GERD (gastroesophageal reflux disease)   . Anemia     Past Surgical History  Procedure Laterality Date  . Cervical biopsy  w/ loop electrode excision    . Whipple procedure  2008  . Cervical tumor removed    . Dilation and curettage of uterus  2012  . Leep    . Robot assisted myomectomy N/A 08/23/2013    Procedure: ROBOTIC ASSISTED MYOMECTOMY;  Surgeon: Lahoma Crocker, MD;  Location: [facility];  Service: Gynecology;  Laterality: N/A;    History   Social History  . Marital Status: Legally Separated    Spouse Name: N/A    Number of Children: 0  . Years of Education: N/A   Occupational History  . CSR    Social History Main Topics  . Smoking status: Former Smoker    Types: Cigarettes    Quit date: 03/21/2006  . Smokeless tobacco: Never Used  . Alcohol Use: Yes     Comment: occaisionally  . Drug Use: No  . Sexual Activity:    Partners: Male    Birth Control/ Protection: None   Other Topics Concern  . Not on file   Social History Narrative    Current Outpatient Prescriptions on File Prior to Visit  Medication Sig Dispense Refill  . acetaminophen (TYLENOL) 325 MG tablet Take 650 mg by mouth every 6 (six) hours as needed.    Marland Kitchen albuterol (PROVENTIL HFA;VENTOLIN HFA) 108 (90  BASE) MCG/ACT inhaler Inhale 1-2 puffs into the lungs every 6 (six) hours as needed for wheezing or shortness of breath.    . Cyanocobalamin (VITAMIN B-12 CR PO) Take by mouth.    Marland Kitchen FLUoxetine (PROZAC) 20 MG tablet Take 1 tablet (20 mg total) by mouth daily. 30 tablet 3  . folic acid (FOLVITE) 1 MG tablet Take 1 mg by mouth daily.    . Iron Combinations (IRON COMPLEX PO) Take by mouth.    . lipase/protease/amylase (CREON) 36000 UNITS CPEP capsule Take 2 capsules (72,000 Units total) by mouth 3 (three) times daily before meals. 180 capsule 2  . Mefenamic Acid 250 MG CAPS     . omeprazole (PRILOSEC) 40 MG capsule Take 40 mg by mouth daily as needed (for acid reflux).    . Prenatal Vit-Fe Fumarate-FA (PRENATAL MULTIVITAMIN) TABS tablet Take 1 tablet by mouth daily at 12 noon.    . vitamin C (ASCORBIC ACID) 500 MG tablet Take 500 mg by mouth daily.     . Vitamin D, Ergocalciferol, (DRISDOL) 50000 UNITS CAPS capsule Take 1 capsule (50,000 Units total) by mouth every 7 (seven) days. 12 capsule 0  . VITAMIN D, ERGOCALCIFEROL, PO Take by mouth.     No current facility-administered medications on file prior to visit.  Allergies  Allergen Reactions  . Sulfa Antibiotics Other (See Comments)    Causes headache.  . Amoxicillin Hives and Rash    Family History  Problem Relation Age of Onset  . Stroke Mother   . Hypertension Mother   . Diabetes Father   . Hypertension Maternal Grandmother   . Dementia Maternal Grandmother   . Cancer Maternal Grandfather     bone  . Diabetes Paternal Grandmother   . Hypertension Paternal Grandmother   . Prostate cancer Paternal Grandfather   . Colon cancer Neg Hx   . Breast cancer Paternal Aunt   . Heart disease Maternal Grandmother   . Heart disease Paternal Grandmother   . Colon polyps Neg Hx   . Esophageal cancer Neg Hx     BP 118/60 mmHg  Pulse 74  Temp(Src) 99.1 F (37.3 C) (Oral)  Ht 5' 8.5" (1.74 m)  Wt 196 lb (88.905 kg)  BMI 29.36 kg/m2   SpO2 98%  LMP 01/04/2014    Review of Systems She has hypoglycemia twice a month.      Objective:   Physical Exam VITAL SIGNS:  See vs page GENERAL: no distress Pulses: dorsalis pedis intact bilat.   Feet: no deformity.  no edema Skin:  no ulcer on the feet.  normal color and temp. Neuro: sensation is intact to touch on the feet.   Lab Results  Component Value Date   HGBA1C 6.3 12/31/2013      Assessment & Plan:  DM: apparently well-controlled.   Noncompliance with cbg recording and insulin: despite a recent good a1c, she needs further simplification in her insulin schedule.     Patient is advised the following: Patient Instructions  check your blood sugar twice a day.  vary the time of day when you check, between before the 3 meals, and at bedtime.  also check if you have symptoms of your blood sugar being too high or too low.  please keep a record of the readings and bring it to your next appointment here.  You can write it on any piece of paper.  please call us sooner if your blood sugar goes below 70, or if you have a lot of readings over 200. In view of your medical condition, you should avoid pregnancy until we have decided it is safe.   Let's change to a once-a-day insulin called "levemir." Please come back for a follow-up appointment in 1 month.

## 2014-01-31 NOTE — Patient Instructions (Addendum)
check your blood sugar twice a day.  vary the time of day when you check, between before the 3 meals, and at bedtime.  also check if you have symptoms of your blood sugar being too high or too low.  please keep a record of the readings and bring it to your next appointment here.  You can write it on any piece of paper.  please call us sooner if your blood sugar goes below 70, or if you have a lot of readings over 200. In view of your medical condition, you should avoid pregnancy until we have decided it is safe.   Let's change to a once-a-day insulin called "levemir." Please come back for a follow-up appointment in 1 month.

## 2014-02-05 ENCOUNTER — Encounter (HOSPITAL_COMMUNITY): Payer: Self-pay | Admitting: *Deleted

## 2014-02-05 ENCOUNTER — Emergency Department (HOSPITAL_COMMUNITY)
Admission: EM | Admit: 2014-02-05 | Discharge: 2014-02-05 | Disposition: A | Discharge status: Home / Self Care | Payer: BC Managed Care – PPO | Attending: Emergency Medicine | Admitting: Emergency Medicine

## 2014-02-05 DIAGNOSIS — K219 Gastro-esophageal reflux disease without esophagitis: Secondary | ICD-10-CM | POA: Diagnosis not present

## 2014-02-05 DIAGNOSIS — Z880 Allergy status to penicillin: Secondary | ICD-10-CM | POA: Diagnosis not present

## 2014-02-05 DIAGNOSIS — J45909 Unspecified asthma, uncomplicated: Secondary | ICD-10-CM | POA: Insufficient documentation

## 2014-02-05 DIAGNOSIS — Z794 Long term (current) use of insulin: Secondary | ICD-10-CM | POA: Insufficient documentation

## 2014-02-05 DIAGNOSIS — D649 Anemia, unspecified: Secondary | ICD-10-CM | POA: Insufficient documentation

## 2014-02-05 DIAGNOSIS — J069 Acute upper respiratory infection, unspecified: Secondary | ICD-10-CM

## 2014-02-05 DIAGNOSIS — E119 Type 2 diabetes mellitus without complications: Secondary | ICD-10-CM | POA: Diagnosis not present

## 2014-02-05 DIAGNOSIS — Z79899 Other long term (current) drug therapy: Secondary | ICD-10-CM | POA: Insufficient documentation

## 2014-02-05 DIAGNOSIS — Z87891 Personal history of nicotine dependence: Secondary | ICD-10-CM | POA: Diagnosis not present

## 2014-02-05 DIAGNOSIS — R0981 Nasal congestion: Secondary | ICD-10-CM | POA: Diagnosis present

## 2014-02-05 DIAGNOSIS — R52 Pain, unspecified: Secondary | ICD-10-CM

## 2014-02-05 MED ORDER — IBUPROFEN 200 MG PO TABS: 600.0000 mg | ORAL_TABLET | Freq: Once | ORAL | Status: DC

## 2014-02-05 MED ORDER — ALBUTEROL SULFATE HFA 108 (90 BASE) MCG/ACT IN AERS: 1.0000 | INHALATION_SPRAY | Freq: Four times a day (QID) | RESPIRATORY_TRACT | Status: AC | PRN

## 2014-02-05 NOTE — ED Provider Notes (Signed)
CSN: [id number]     Arrival date & time 02/05/14  0620 History   First MD Initiated Contact with Patient 02/05/14 0700     Chief Complaint  Patient presents with  . URI     (Consider location/radiation/quality/duration/timing/severity/associated sxs/prior Treatment) HPI Comments: 38 year old female  With diabetes, sinusitis, migraine presents with bodyaches, chills, sinus congestion for the past 12 hours. Patient has had this in the past. No fevers or sick contacts or travel. Patient tolerating oral. Symptoms fairly constant since her today.  Patient is a 38 y.o. female presenting with URI. The history is provided by the patient.  URI Presenting symptoms: congestion, cough, fever (subj) and sore throat   Associated symptoms: arthralgias   Associated symptoms: no headaches and no neck pain     Past Medical History  Diagnosis Date  . Diabetes mellitus without complication   . Asthma   . GERD (gastroesophageal reflux disease)   . Anemia    Past Surgical History  Procedure Laterality Date  . Cervical biopsy  w/ loop electrode excision    . Whipple procedure  2008  . Cervical tumor removed    . Dilation and curettage of uterus  2012  . Leep    . Robot assisted myomectomy N/A 08/23/2013    Procedure: ROBOTIC ASSISTED MYOMECTOMY;  Surgeon: Lahoma Crocker, MD;  Location: [facility];  Service: Gynecology;  Laterality: N/A;   Family History  Problem Relation Age of Onset  . Stroke Mother   . Hypertension Mother   . Diabetes Father   . Hypertension Maternal Grandmother   . Dementia Maternal Grandmother   . Cancer Maternal Grandfather     bone  . Diabetes Paternal Grandmother   . Hypertension Paternal Grandmother   . Prostate cancer Paternal Grandfather   . Colon cancer Neg Hx   . Breast cancer Paternal Aunt   . Heart disease Maternal Grandmother   . Heart disease Paternal Grandmother   . Colon polyps Neg Hx   . Esophageal cancer Neg Hx    History  Substance Use Topics  .  Smoking status: Former Smoker    Types: Cigarettes    Quit date: 03/21/2006  . Smokeless tobacco: Never Used  . Alcohol Use: Yes     Comment: occaisionally   OB History    Gravida Para Term Preterm AB TAB SAB Ectopic Multiple Living   4    4  4    0     Review of Systems  Constitutional: Positive for fever (subj) and chills. Negative for appetite change.  HENT: Positive for congestion and sore throat.   Eyes: Negative for visual disturbance.  Respiratory: Positive for cough. Negative for shortness of breath.   Cardiovascular: Negative for chest pain.  Gastrointestinal: Negative for vomiting and abdominal pain.  Genitourinary: Negative for dysuria and flank pain.  Musculoskeletal: Positive for arthralgias. Negative for back pain, neck pain and neck stiffness.  Skin: Negative for rash.  Neurological: Negative for light-headedness and headaches.      Allergies  Sulfa antibiotics and Amoxicillin  Home Medications   Prior to Admission medications   Medication Sig Start Date End Date Taking? Authorizing Provider  acetaminophen (TYLENOL) 325 MG tablet Take 650 mg by mouth every 6 (six) hours as needed (for pain.).    Yes Historical Provider, MD  albuterol (PROVENTIL HFA;VENTOLIN HFA) 108 (90 BASE) MCG/ACT inhaler Inhale 1-2 puffs into the lungs every 6 (six) hours as needed for wheezing or shortness of breath.   Yes Historical  Provider, MD  Cyanocobalamin (VITAMIN B-12 CR PO) Take 1 tablet by mouth daily.    Yes Historical Provider, MD  FLUoxetine (PROZAC) 20 MG tablet Take 1 tablet (20 mg total) by mouth daily. 01/23/14  Yes Midge Minium, MD  folic acid (FOLVITE) 1 MG tablet Take 1 mg by mouth daily.   Yes Historical Provider, MD  insulin detemir (LEVEMIR) 100 UNIT/ML injection Inject 0.3 mLs (30 Units total) into the skin every morning. And syringes 1/day 01/31/14  Yes Renato Shin, MD  Iron Combinations (IRON COMPLEX PO) Take 1 capsule by mouth daily.    Yes Historical  Provider, MD  lipase/protease/amylase (CREON) 36000 UNITS CPEP capsule Take 2 capsules (72,000 Units total) by mouth 3 (three) times daily before meals. 01/14/14  Yes Lafayette Dragon, MD  omeprazole (PRILOSEC) 40 MG capsule Take 40 mg by mouth daily as needed (for acid reflux).   Yes Historical Provider, MD  Prenatal Vit-Fe Fumarate-FA (PRENATAL MULTIVITAMIN) TABS tablet Take 1 tablet by mouth daily at 12 noon.   Yes Historical Provider, MD  vitamin C (ASCORBIC ACID) 500 MG tablet Take 500 mg by mouth daily.    Yes Historical Provider, MD  Vitamin D, Ergocalciferol, (DRISDOL) 50000 UNITS CAPS capsule Take 1 capsule (50,000 Units total) by mouth every 7 (seven) days. Patient taking differently: Take 50,000 Units by mouth every 7 (seven) days. Saturdays. 01/23/14  Yes Midge Minium, MD  albuterol (PROVENTIL HFA;VENTOLIN HFA) 108 (90 BASE) MCG/ACT inhaler Inhale 1-2 puffs into the lungs every 6 (six) hours as needed for wheezing or shortness of breath. 02/05/14   Mariea Clonts, MD  Mefenamic Acid 250 MG CAPS Take 250 mg by mouth as directed. Patient takes when needed during menstrual cycle 07/25/13   Historical Provider, MD   BP 109/66 mmHg  Pulse 74  Temp(Src) 98.6 F (37 C) (Oral)  Resp 18  Ht 5\' 9"  (1.753 m)  Wt 196 lb (88.905 kg)  BMI 28.93 kg/m2  SpO2 100%  LMP 02/02/2014 Physical Exam  Constitutional: She is oriented to person, place, and time. She appears well-developed and well-nourished.  HENT:  Head: Normocephalic and atraumatic.  Nasal congestion  Eyes: Conjunctivae are normal. Right eye exhibits no discharge. Left eye exhibits no discharge.  Neck: Normal range of motion. Neck supple. No tracheal deviation present.  Cardiovascular: Normal rate and regular rhythm.   Pulmonary/Chest: Effort normal and breath sounds normal.  Abdominal: Soft. She exhibits no distension. There is no tenderness. There is no guarding.  Musculoskeletal: She exhibits no edema.  Neurological: She is  alert and oriented to person, place, and time.  Skin: Skin is warm. No rash noted.  Psychiatric: She has a normal mood and affect.  Nursing note and vitals reviewed.   ED Course  Procedures (including critical care time) Labs Review Labs Reviewed - No data to display  Imaging Review No results found.   EKG Interpretation None      MDM   Final diagnoses:  Body aches  URI (upper respiratory infection)   Although pt is a DM she is well-appearing, sxs less than 12 hrs, patient with normal vital signs presents with sinus/upper respiratory symptoms. Lung exam benign. Very low pretest probability for significant disease at this time. Discussed holding any imaging and blood work at this time, supportive care and close follow-up outpatient if no improvement in the next 2-3 days.  Results and differential diagnosis were discussed with the patient/parent/guardian. Close follow up outpatient was discussed, comfortable  with the plan.   Medications - No data to display  Filed Vitals:   02/05/14 0629  BP: 109/66  Pulse: 74  Temp: 98.6 F (37 C)  TempSrc: Oral  Resp: 18  Height: 5\' 9"  (1.753 m)  Weight: 196 lb (88.905 kg)  SpO2: 100%    Final diagnoses:  Body aches  URI (upper respiratory infection)       Mariea Clonts, MD 02/05/14 [phone]

## 2014-02-05 NOTE — ED Notes (Signed)
PT states that she began having generalized body aches, cough congestion; pt states that she felt like she had to increase the frequency she was using her inhaler yesterday and pt states that she lost her inhaler this am; pt states that she had a fever last pm; pt c/o continued bosy aches, cough and congestion

## 2014-02-05 NOTE — Discharge Instructions (Signed)
If you were given medicines take as directed.  If you are on coumadin or contraceptives realize their levels and effectiveness is altered by many different medicines.  If you have any reaction (rash, tongues swelling, other) to the medicines stop taking and see a physician.   Please follow up as directed and return to the ER or see a physician for new or worsening symptoms.  Thank you. Filed Vitals:   02/05/14 0629  BP: 109/66  Pulse: 74  Temp: 98.6 F (37 C)  TempSrc: Oral  Resp: 18  Height: 5\' 9"  (1.753 m)  Weight: 196 lb (88.905 kg)  SpO2: 100%

## 2014-02-18 ENCOUNTER — Encounter: Payer: Self-pay | Admitting: Internal Medicine

## 2014-02-18 ENCOUNTER — Other Ambulatory Visit (INDEPENDENT_AMBULATORY_CARE_PROVIDER_SITE_OTHER): Payer: BC Managed Care – PPO

## 2014-02-18 ENCOUNTER — Ambulatory Visit (INDEPENDENT_AMBULATORY_CARE_PROVIDER_SITE_OTHER): Payer: BC Managed Care – PPO | Admitting: Internal Medicine

## 2014-02-18 VITALS — BP 98/56 | HR 72 | Ht 69.0 in | Wt 200.1 lb

## 2014-02-18 DIAGNOSIS — K868 Other specified diseases of pancreas: Secondary | ICD-10-CM

## 2014-02-18 DIAGNOSIS — K8689 Other specified diseases of pancreas: Secondary | ICD-10-CM

## 2014-02-18 DIAGNOSIS — K904 Malabsorption due to intolerance, not elsewhere classified: Secondary | ICD-10-CM

## 2014-02-18 DIAGNOSIS — Z90411 Acquired partial absence of pancreas: Secondary | ICD-10-CM

## 2014-02-18 DIAGNOSIS — K909 Intestinal malabsorption, unspecified: Secondary | ICD-10-CM

## 2014-02-18 LAB — VITAMIN D 25 HYDROXY (VIT D DEFICIENCY, FRACTURES): VITD: 20.3 ng/mL — AB (ref 30.00–100.00)

## 2014-02-18 LAB — FERRITIN: Ferritin: 19.7 ng/mL (ref 10.0–291.0)

## 2014-02-18 LAB — CBC WITH DIFFERENTIAL/PLATELET
BASOS PCT: 0.5 % (ref 0.0–3.0)
Basophils Absolute: 0.1 10*3/uL (ref 0.0–0.1)
Eosinophils Absolute: 0.1 10*3/uL (ref 0.0–0.7)
Eosinophils Relative: 0.8 % (ref 0.0–5.0)
HCT: 37.9 % (ref 36.0–46.0)
Hemoglobin: 12.4 g/dL (ref 12.0–15.0)
LYMPHS PCT: 18.2 % (ref 12.0–46.0)
Lymphs Abs: 2.1 10*3/uL (ref 0.7–4.0)
MCHC: 32.9 g/dL (ref 30.0–36.0)
MCV: 86.2 fl (ref 78.0–100.0)
Monocytes Absolute: 0.5 10*3/uL (ref 0.1–1.0)
Monocytes Relative: 4.5 % (ref 3.0–12.0)
Neutro Abs: 8.9 10*3/uL — ABNORMAL HIGH (ref 1.4–7.7)
Neutrophils Relative %: 76 % (ref 43.0–77.0)
PLATELETS: 220 10*3/uL (ref 150.0–400.0)
RBC: 4.39 Mil/uL (ref 3.87–5.11)
RDW: 13.7 % (ref 11.5–15.5)
WBC: 11.8 10*3/uL — AB (ref 4.0–10.5)

## 2014-02-18 LAB — VITAMIN B12: Vitamin B-12: >1500 pg/mL — ABNORMAL HIGH (ref 211–911)

## 2014-02-18 NOTE — Patient Instructions (Addendum)
Your physician has requested that you go to the basement for the following lab work before leaving today: CBC, IBC, Ferritin, B12, CMET, Vit. D level  You have been scheduled for an MRI/ MRCP at [facility] on 03/06/14 . Your appointment time is 8:00 am. Please arrive 15 minutes prior to your appointment time for registration purposes. Please make certain not to have anything to eat or drink 6 hours prior to your test. In addition, if you have any metal in your body, have a pacemaker or defibrillator, please be sure to let your ordering physician know. This test typically takes 45 minutes to 1 hour to complete.

## 2014-02-18 NOTE — Progress Notes (Signed)
Kristina Neal [birth year] [id number]  Note: This dictation was prepared with Dragon digital system. Any transcriptional errors that result from this procedure are unintentional.   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 She ran out of pancreatic enzymes sometime ago and consequently lost  lot of weight and developed steatorrhea. . Her gastric emptying scan to evaluate early satiety is essentially normal. She has been on iron supplements for chronic iron deficiency.. Her last upper endoscopy in November 2008 showed pancreatico- duodenostomy and duoden0- jejunal anastomosis distal to the pylorus. She had a pyloric sparing procedure. Since her last appointment on 01/14/2014, she has gained 12 pounds from 188 to a current 200 pounds. Her diarrhea has resolved and she is feeling fine. She has constant gurgling in the stomach and occasional g-e  reflux. She is asking about her pancreatic stent which was placed several years ago in her pancreas and it is not known whether it is still there.     Past Medical History  Diagnosis Date  . Diabetes mellitus without complication   . Asthma   . GERD (gastroesophageal reflux disease)   . Anemia     Past Surgical History  Procedure Laterality Date  . Cervical biopsy  w/ loop electrode excision    . Whipple procedure  2008  . Cervical tumor removed    . Dilation and curettage of uterus  2012  . Leep    . Robot assisted myomectomy N/A 08/23/2013    Procedure: ROBOTIC ASSISTED MYOMECTOMY;  Surgeon: Lahoma Crocker, MD;  Location: [facility];  Service: Gynecology;  Laterality: N/A;    Allergies  Allergen Reactions  . Sulfa Antibiotics Other (See Comments)    Causes headache.  .  Amoxicillin Hives and Rash    Family history and social history have been reviewed.  Review of Systems: Positive for weight gain. Decreased diarrhea. Denies abdominal pain  The remainder of the 10 point ROS is negative except as outlined in the H&P  Physical Exam: General Appearance Well developed, in no distress Eyes  Non icteric  HEENT  Non traumatic, normocephalic  Mouth No lesion, tongue papillated, no cheilosis Neck Supple without adenopathy, thyroid not enlarged, no carotid bruits, no JVD Lungs Clear to auscultation bilaterally COR Normal S1, normal S2, regular rhythm, no murmur, quiet precordium Abdomen Hyperactive bowel sounds. Soft nontender lower abdomen unremarkable  Rectal Not done  Extremities  No pedal edema Skin No lesions Neurological Alert and oriented x 3 Psychological Normal mood and affect  Assessment and Plan:   Problem #62 38 year old African-American female with exocrine and endocrine pancreatic insufficiency who has responded to pancreatic enzyme replacement. She takes 72,000 units with each meal. Her insurance now covers it fully. Her gastric emptying is normal. Today, we are going to check her blood count, sedimentation rate, vitamin D levels, iron levels, and B12. She may need an upper endoscopy if she develops any digestive symptoms. We may also consider bone density pending results or her vitamin D levels. She may also need an iron infusion in place of the oral iron supplements.  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 She had placement of a pancreatic stent in 2008. We need to rule out pancreatic stent obstruction or migration. We will  proceed with an MRCP to assess the pancreatic anatomy and position  of the pancreatic stent.   Delfin Edis 02/18/2014

## 2014-02-19 LAB — COMPREHENSIVE METABOLIC PANEL
ALK PHOS: 57 U/L (ref 39–117)
ALT: 24 U/L (ref 0–35)
AST: 27 U/L (ref 0–37)
Albumin: 4.4 g/dL (ref 3.5–5.2)
BILIRUBIN TOTAL: 0.6 mg/dL (ref 0.2–1.2)
BUN: 10 mg/dL (ref 6–23)
CO2: 24 meq/L (ref 19–32)
Calcium: 9.1 mg/dL (ref 8.4–10.5)
Chloride: 100 mEq/L (ref 96–112)
Creatinine, Ser: 0.6 mg/dL (ref 0.4–1.2)
GFR: 141.1 mL/min (ref >60.00)
Glucose, Bld: 127 mg/dL — ABNORMAL HIGH (ref 70–99)
Potassium: 4.1 mEq/L (ref 3.5–5.1)
SODIUM: 134 meq/L — AB (ref 135–145)
Total Protein: 7.5 g/dL (ref 6.0–8.3)

## 2014-02-19 LAB — IBC PANEL
Iron: 84 ug/dL (ref 42–145)
SATURATION RATIOS: 19 % — AB (ref 20.0–50.0)
TRANSFERRIN: 316.4 mg/dL (ref 212.0–360.0)

## 2014-02-20 ENCOUNTER — Other Ambulatory Visit: Payer: Self-pay | Admitting: *Deleted

## 2014-02-20 MED ORDER — VITAMIN D (ERGOCALCIFEROL) 1.25 MG (50000 UNIT) PO CAPS: ORAL_CAPSULE | ORAL | Status: DC

## 2014-02-21 ENCOUNTER — Encounter: Payer: Self-pay | Admitting: Family Medicine

## 2014-02-21 ENCOUNTER — Ambulatory Visit (INDEPENDENT_AMBULATORY_CARE_PROVIDER_SITE_OTHER): Payer: BC Managed Care – PPO | Admitting: Family Medicine

## 2014-02-21 VITALS — BP 110/68 | HR 74 | Temp 98.1°F | Resp 16 | Wt 200.0 lb

## 2014-02-21 DIAGNOSIS — F32A Depression, unspecified: Secondary | ICD-10-CM

## 2014-02-21 DIAGNOSIS — R198 Other specified symptoms and signs involving the digestive system and abdomen: Secondary | ICD-10-CM

## 2014-02-21 DIAGNOSIS — R194 Change in bowel habit: Secondary | ICD-10-CM | POA: Insufficient documentation

## 2014-02-21 DIAGNOSIS — F329 Major depressive disorder, single episode, unspecified: Secondary | ICD-10-CM

## 2014-02-21 DIAGNOSIS — G43009 Migraine without aura, not intractable, without status migrainosus: Secondary | ICD-10-CM

## 2014-02-21 MED ORDER — SUMATRIPTAN SUCCINATE 50 MG PO TABS: 50.0000 mg | ORAL_TABLET | Freq: Once | ORAL | Status: DC

## 2014-02-21 NOTE — Progress Notes (Signed)
   Subjective:    Patient ID: Kristina Neal, female    DOB: Dec 24, 1975, 38 y.o.   MRN: [id number]  HPI Migraine- pt has long hx of migraines.  Not currently on meds.  Occuring before and after menses.  No visual changes.  + nausea.  No vomiting.  + phono/photophobia.  HAs ease w/ sleep but don't resolve.  HAs typically last 2 days.  OTC migraine meds not effective.  Last HA 11/23.  Depression- pt reports sxs have improved since starting Prozac but it is causing increased fatigue.  Taking Melatonin at night  Increased bathroom use- due to DM and pancreatic insufficiency (now on Creon).  Pt needs note for work allowing to use restroom as needed (they deem her use too frequent)   Review of Systems For ROS see HPI     Objective:   Physical Exam  Constitutional: She is oriented to person, place, and time. She appears well-developed and well-nourished. No distress.  HENT:  Head: Normocephalic and atraumatic.  TMs WNL No TTP over sinuses Minimal nasal congestion  Eyes: Conjunctivae and EOM are normal. Pupils are equal, round, and reactive to light.  Neck: Normal range of motion. Neck supple.  Cardiovascular: Normal rate, regular rhythm, normal heart sounds and intact distal pulses.   Pulmonary/Chest: Effort normal and breath sounds normal. No respiratory distress. She has no wheezes. She has no rales.  Lymphadenopathy:    She has no cervical adenopathy.  Neurological: She is alert and oriented to person, place, and time. She has normal reflexes. No cranial nerve deficit. Coordination normal.  Psychiatric: She has a normal mood and affect. Her behavior is normal. Judgment and thought content normal.  Vitals reviewed.         Assessment & Plan:

## 2014-02-21 NOTE — Patient Instructions (Signed)
Follow up in 2 months to recheck migraines and mood Take 2 Excedrin Migraine the 1-2 days before your period and then the 1-2 days at the end.  If you start to develop migraine, take the Imitrex as soon as possible Switch the Prozac to dinner Call with any questions or concerns Hang in there! Happy Holidays!!!

## 2014-02-21 NOTE — Progress Notes (Signed)
Pre visit review using our clinic review tool, if applicable. No additional management support is needed unless otherwise documented below in the visit note. 

## 2014-02-23 NOTE — Assessment & Plan Note (Signed)
Chronic problem.  Improving since starting Prozac but pt having issues w/ fatigue.  Pt to move medication to evening and see if her issues w/ fatigue improve.  Will follow.

## 2014-02-23 NOTE — Assessment & Plan Note (Signed)
New.  This is due to pt's pancreatic insufficiency.  Pt was given note for work to allow her to use the bathroom prn.

## 2014-02-23 NOTE — Assessment & Plan Note (Signed)
Chronic problem for pt.  Not currently on meds.  Start Imitrex prn.  Reviewed prophylactic use of Excedrin migraine at beginning and end of menses to avoid HA.  Will follow.

## 2014-02-26 ENCOUNTER — Ambulatory Visit: Payer: BC Managed Care – PPO | Admitting: Family Medicine

## 2014-02-27 ENCOUNTER — Ambulatory Visit: Payer: BC Managed Care – PPO | Admitting: Obstetrics & Gynecology

## 2014-03-03 ENCOUNTER — Encounter: Payer: Self-pay | Admitting: Endocrinology

## 2014-03-03 ENCOUNTER — Ambulatory Visit (INDEPENDENT_AMBULATORY_CARE_PROVIDER_SITE_OTHER): Payer: BC Managed Care – PPO | Admitting: Endocrinology

## 2014-03-03 VITALS — BP 118/78 | HR 83 | Temp 98.1°F | Ht 69.0 in | Wt 203.0 lb

## 2014-03-03 DIAGNOSIS — IMO0002 Reserved for concepts with insufficient information to code with codable children: Secondary | ICD-10-CM

## 2014-03-03 DIAGNOSIS — E1065 Type 1 diabetes mellitus with hyperglycemia: Secondary | ICD-10-CM

## 2014-03-03 MED ORDER — GLUCOSE BLOOD VI STRP: 1.0000 | ORAL_STRIP | Freq: Two times a day (BID) | Status: DC

## 2014-03-03 NOTE — Progress Notes (Signed)
Subjective:    Patient ID: Kristina Neal, female    DOB: 1976-03-17, 38 y.o.   MRN: [id number]  HPI Pt returns for f/u of diabetes mellitus: DM type: 1 vs due to partial pancreatectomy in 2008 (for a benign tumor) vs both.   Dx'ed: 7829 Complications:  Therapy: insulin since 2011, when she had DKA. GDM: never DKA: as above, and again in 2014. Severe hypoglycemia: never Pancreatitis: never.   Other: due to noncompliance, she is on a simple insulin schedule. Interval history: Since on the levemir, she forgets to take it 1-2 times per week.  She does not check cbg's.  pt states she feels well in general.  Past Medical History  Diagnosis Date  . Diabetes mellitus without complication   . Asthma   . GERD (gastroesophageal reflux disease)   . Anemia     Past Surgical History  Procedure Laterality Date  . Cervical biopsy  w/ loop electrode excision    . Whipple procedure  2008  . Cervical tumor removed    . Dilation and curettage of uterus  2012  . Leep    . Robot assisted myomectomy N/A 08/23/2013    Procedure: ROBOTIC ASSISTED MYOMECTOMY;  Surgeon: Lahoma Crocker, MD;  Location: [facility];  Service: Gynecology;  Laterality: N/A;    History   Social History  . Marital Status: Legally Separated    Spouse Name: N/A    Number of Children: 0  . Years of Education: N/A   Occupational History  . CSR    Social History Main Topics  . Smoking status: Former Smoker    Types: Cigarettes    Quit date: 03/21/2006  . Smokeless tobacco: Never Used  . Alcohol Use: 0.0 oz/week    0 Not specified per week     Comment: occasionally  . Drug Use: No  . Sexual Activity:    Partners: Male    Birth Control/ Protection: None   Other Topics Concern  . Not on file   Social History Narrative    Current Outpatient Prescriptions on File Prior to Visit  Medication Sig Dispense Refill  . acetaminophen (TYLENOL) 325 MG tablet Take 650 mg by mouth every 6 (six) hours as  needed (for pain.).     Marland Kitchen albuterol (PROVENTIL HFA;VENTOLIN HFA) 108 (90 BASE) MCG/ACT inhaler Inhale 1-2 puffs into the lungs every 6 (six) hours as needed for wheezing or shortness of breath. 1 Inhaler 0  . Cyanocobalamin (VITAMIN B-12 CR PO) Take 1 tablet by mouth daily.     Marland Kitchen FLUoxetine (PROZAC) 20 MG tablet Take 1 tablet (20 mg total) by mouth daily. 30 tablet 3  . folic acid (FOLVITE) 1 MG tablet Take 1 mg by mouth daily.    . insulin detemir (LEVEMIR) 100 UNIT/ML injection Inject 0.3 mLs (30 Units total) into the skin every morning. And syringes 1/day 10 mL 11  . Iron Combinations (IRON COMPLEX PO) Take 1 capsule by mouth daily.     . lipase/protease/amylase (CREON) 36000 UNITS CPEP capsule Take 2 capsules (72,000 Units total) by mouth 3 (three) times daily before meals. 180 capsule 2  . Mefenamic Acid 250 MG CAPS Take 250 mg by mouth as directed. Patient takes when needed during menstrual cycle    . Melatonin 3 MG CAPS Take by mouth.    Marland Kitchen omeprazole (PRILOSEC) 40 MG capsule Take 40 mg by mouth daily as needed (for acid reflux).    . Prenatal Vit-Fe Fumarate-FA (PRENATAL MULTIVITAMIN) TABS  tablet Take 1 tablet by mouth daily at 12 noon.    . SUMAtriptan (IMITREX) 50 MG tablet Take 1 tablet (50 mg total) by mouth once. May repeat in 2 hours if headache persists or recurs. 10 tablet 3  . vitamin C (ASCORBIC ACID) 500 MG tablet Take 500 mg by mouth daily.     . Vitamin D, Ergocalciferol, (DRISDOL) 50000 UNITS CAPS capsule Take 50,000 units po weekly x 12 weeks 12 capsule 0   No current facility-administered medications on file prior to visit.    Allergies  Allergen Reactions  . Sulfa Antibiotics Other (See Comments)    Causes headache.  . Amoxicillin Hives and Rash    Family History  Problem Relation Age of Onset  . Stroke Mother   . Hypertension Mother   . Diabetes Father   . Hypertension Maternal Grandmother   . Dementia Maternal Grandmother   . Cancer Maternal Grandfather      bone  . Diabetes Paternal Grandmother   . Hypertension Paternal Grandmother   . Prostate cancer Paternal Grandfather   . Colon cancer Neg Hx   . Breast cancer Paternal Aunt   . Heart disease Maternal Grandmother   . Heart disease Paternal Grandmother   . Colon polyps Neg Hx   . Esophageal cancer Neg Hx     BP 118/78 mmHg  Pulse 83  Temp(Src) 98.1 F (36.7 C) (Oral)  Ht 5\' 9"  (1.753 m)  Wt 203 lb (92.08 kg)  BMI 29.96 kg/m2  SpO2 98%  LMP 02/02/2014   Review of Systems Since on the levemir, she thinks she has had 1 episode of hypoglycemia, and this was mild.  Denies weight change    Objective:   Physical Exam VITAL SIGNS:  See vs page GENERAL: no distress Pulses: dorsalis pedis intact bilat.   Feet: no deformity.  no edema Skin:  no ulcer on the feet.  normal color and temp. Neuro: sensation is intact to touch on the feet   Lab Results  Component Value Date   TSH 1.44 01/22/2014   Lab Results  Component Value Date   HGBA1C 6.3 12/31/2013      Assessment & Plan:  DM: uncertain control Noncompliance with cbg recording: I'll work around this as best I can.    Patient is advised the following: Patient Instructions  check your blood sugar twice a day.  vary the time of day when you check, between before the 3 meals, and at bedtime.  also check if you have symptoms of your blood sugar being too high or too low.  please keep a record of the readings and bring it to your next appointment here.  You can write it on any piece of paper.  please call us sooner if your blood sugar goes below 70, or if you have a lot of readings over 200.  Here are 2 new identical meters.  i have sent a prescription to your pharmacy, for strips.   In view of your medical condition, you should avoid pregnancy until we have decided it is safe.   Please continue the same insulin.   Please come back for a follow-up appointment in 6 weeks.

## 2014-03-03 NOTE — Patient Instructions (Addendum)
check your blood sugar twice a day.  vary the time of day when you check, between before the 3 meals, and at bedtime.  also check if you have symptoms of your blood sugar being too high or too low.  please keep a record of the readings and bring it to your next appointment here.  You can write it on any piece of paper.  please call us sooner if your blood sugar goes below 70, or if you have a lot of readings over 200.  Here are 2 new identical meters.  i have sent a prescription to your pharmacy, for strips.   In view of your medical condition, you should avoid pregnancy until we have decided it is safe.   Please continue the same insulin.   Please come back for a follow-up appointment in 6 weeks.

## 2014-03-06 ENCOUNTER — Telehealth: Payer: Self-pay | Admitting: *Deleted

## 2014-03-06 ENCOUNTER — Ambulatory Visit (HOSPITAL_COMMUNITY): Admission: RE | Admit: 2014-03-06 | Payer: BC Managed Care – PPO | Source: Ambulatory Visit

## 2014-03-06 NOTE — Telephone Encounter (Signed)
Patient dropped off paperwork for accomodation for work. Patient needs more bathroom breaks due to increase in bowel movements. JG//CMA

## 2014-03-12 DIAGNOSIS — Z7689 Persons encountering health services in other specified circumstances: Secondary | ICD-10-CM

## 2014-03-17 ENCOUNTER — Encounter: Payer: Self-pay | Admitting: *Deleted

## 2014-03-18 ENCOUNTER — Encounter: Payer: Self-pay | Admitting: Obstetrics & Gynecology

## 2014-03-28 NOTE — Telephone Encounter (Signed)
Late entry: Paperwork faxed 03/06/2014. JG//CMA

## 2014-04-10 ENCOUNTER — Encounter: Payer: Self-pay | Admitting: Endocrinology

## 2014-04-10 ENCOUNTER — Ambulatory Visit (INDEPENDENT_AMBULATORY_CARE_PROVIDER_SITE_OTHER): Payer: Self-pay | Admitting: Endocrinology

## 2014-04-10 VITALS — BP 112/66 | HR 83 | Temp 98.3°F | Ht 69.0 in | Wt 208.0 lb

## 2014-04-10 DIAGNOSIS — E1065 Type 1 diabetes mellitus with hyperglycemia: Secondary | ICD-10-CM

## 2014-04-10 DIAGNOSIS — IMO0002 Reserved for concepts with insufficient information to code with codable children: Secondary | ICD-10-CM

## 2014-04-10 LAB — HEMOGLOBIN A1C: Hgb A1c MFr Bld: 7.4 % — ABNORMAL HIGH (ref 4.6–6.5)

## 2014-04-10 NOTE — Patient Instructions (Signed)
check your blood sugar twice a day.  vary the time of day when you check, between before the 3 meals, and at bedtime.  also check if you have symptoms of your blood sugar being too high or too low.  please keep a record of the readings and bring it to your next appointment here.  You can write it on any piece of paper.  please call us sooner if your blood sugar goes below 70, or if you have a lot of readings over 200.   In view of your medical condition, you should avoid pregnancy until we have decided it is safe.    Please come back for a follow-up appointment in 2 months.

## 2014-04-10 NOTE — Progress Notes (Signed)
Subjective:    Patient ID: Kristina Neal, female    DOB: [birth year], 39 y.o.   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   Dx'ed: 7619 Complications: none Therapy: insulin since 2011, when she had DKA.   GDM: never DKA: as above, and again in 2014. Severe hypoglycemia: never Pancreatitis: never.   Other: due to noncompliance, she is on a simple insulin schedule.  Interval history: She takes levemir, 30 units qam.  no cbg record, but states cbg's are mildly low approx once every month or 2.  She missed the levemir x 1 week, 2 weeks ago.  pt states she feels well in general.  Past Medical History  Diagnosis Date  . Diabetes mellitus without complication   . Asthma   . GERD (gastroesophageal reflux disease)   . Anemia     Past Surgical History  Procedure Laterality Date  . Cervical biopsy  w/ loop electrode excision    . Whipple procedure  2008  . Cervical tumor removed    . Dilation and curettage of uterus  2012  . Leep    . Robot assisted myomectomy N/A 08/23/2013    Procedure: ROBOTIC ASSISTED MYOMECTOMY;  Surgeon: Lahoma Crocker, MD;  Location: [facility];  Service: Gynecology;  Laterality: N/A;    History   Social History  . Marital Status: Legally Separated    Spouse Name: N/A    Number of Children: 0  . Years of Education: N/A   Occupational History  . CSR    Social History Main Topics  . Smoking status: Former Smoker    Types: Cigarettes    Quit date: 03/21/2006  . Smokeless tobacco: Never Used  . Alcohol Use: 0.0 oz/week    0 Not specified per week     Comment: occasionally  . Drug Use: No  . Sexual Activity:    Partners: Male    Birth Control/ Protection: None   Other Topics Concern  . Not on file   Social History Narrative    Current Outpatient Prescriptions on File Prior to Visit  Medication Sig Dispense Refill  .  acetaminophen (TYLENOL) 325 MG tablet Take 650 mg by mouth every 6 (six) hours as needed (for pain.).     Marland Kitchen albuterol (PROVENTIL HFA;VENTOLIN HFA) 108 (90 BASE) MCG/ACT inhaler Inhale 1-2 puffs into the lungs every 6 (six) hours as needed for wheezing or shortness of breath. 1 Inhaler 0  . Cyanocobalamin (VITAMIN B-12 CR PO) Take 1 tablet by mouth daily.     Marland Kitchen FLUoxetine (PROZAC) 20 MG tablet Take 1 tablet (20 mg total) by mouth daily. 30 tablet 3  . folic acid (FOLVITE) 1 MG tablet Take 1 mg by mouth daily.    Marland Kitchen glucose blood (ONE TOUCH ULTRA TEST) test strip 1 each by Other route 2 (two) times daily. And lancets 2/day. 100 each 12  . insulin detemir (LEVEMIR) 100 UNIT/ML injection Inject 0.3 mLs (30 Units total) into the skin every morning. And syringes 1/day 10 mL 11  . Iron Combinations (IRON COMPLEX PO) Take 1 capsule by mouth daily.     . lipase/protease/amylase (CREON) 36000 UNITS CPEP capsule Take 2 capsules (72,000 Units total) by mouth 3 (three) times daily before meals. 180 capsule 2  . Mefenamic Acid 250 MG CAPS Take 250 mg by mouth as directed. Patient takes when needed during menstrual  cycle    . Melatonin 3 MG CAPS Take by mouth.    Marland Kitchen omeprazole (PRILOSEC) 40 MG capsule Take 40 mg by mouth daily as needed (for acid reflux).    . Prenatal Vit-Fe Fumarate-FA (PRENATAL MULTIVITAMIN) TABS tablet Take 1 tablet by mouth daily at 12 noon.    . SUMAtriptan (IMITREX) 50 MG tablet Take 1 tablet (50 mg total) by mouth once. May repeat in 2 hours if headache persists or recurs. 10 tablet 3  . vitamin C (ASCORBIC ACID) 500 MG tablet Take 500 mg by mouth daily.     . Vitamin D, Ergocalciferol, (DRISDOL) 50000 UNITS CAPS capsule Take 50,000 units po weekly x 12 weeks 12 capsule 0   No current facility-administered medications on file prior to visit.    Allergies  Allergen Reactions  . Sulfa Antibiotics Other (See Comments)    Causes headache.  . Amoxicillin Hives and Rash    Family History   Problem Relation Age of Onset  . Stroke Mother   . Hypertension Mother   . Diabetes Father   . Hypertension Maternal Grandmother   . Dementia Maternal Grandmother   . Cancer Maternal Grandfather     bone  . Diabetes Paternal Grandmother   . Hypertension Paternal Grandmother   . Prostate cancer Paternal Grandfather   . Colon cancer Neg Hx   . Breast cancer Paternal Aunt   . Heart disease Maternal Grandmother   . Heart disease Paternal Grandmother   . Colon polyps Neg Hx   . Esophageal cancer Neg Hx     BP 112/66 mmHg  Pulse 83  Temp(Src) 98.3 F (36.8 C) (Oral)  Ht 5\' 9"  (1.753 m)  Wt 208 lb (94.348 kg)  BMI 30.70 kg/m2  SpO2 98%    Review of Systems Denies LOC.  She has gained weight.     Objective:   Physical Exam VITAL SIGNS:  See vs page. GENERAL: no distress. Pulses: dorsalis pedis intact bilat.   MSK: no deformity of the feet. CV: no leg edema.  Skin:  no ulcer on the feet.  normal color and temp on the feet. Neuro: sensation is intact to touch on the feet.   Lab Results  Component Value Date   HGBA1C 7.4* 04/10/2014       Assessment & Plan:  DM: this is the best control this pt should aim for, given this regimen, which does match insulin to her changing needs throughout the day  Patient is advised the following: Patient Instructions  check your blood sugar twice a day.  vary the time of day when you check, between before the 3 meals, and at bedtime.  also check if you have symptoms of your blood sugar being too high or too low.  please keep a record of the readings and bring it to your next appointment here.  You can write it on any piece of paper.  please call us sooner if your blood sugar goes below 70, or if you have a lot of readings over 200.   In view of your medical condition, you should avoid pregnancy until we have decided it is safe.    Please come back for a follow-up appointment in 2 months.

## 2014-04-16 ENCOUNTER — Ambulatory Visit: Payer: BC Managed Care – PPO | Admitting: Obstetrics & Gynecology

## 2014-04-24 ENCOUNTER — Telehealth: Payer: Self-pay | Admitting: Family Medicine

## 2014-04-24 ENCOUNTER — Ambulatory Visit: Payer: BLUE CROSS/BLUE SHIELD | Admitting: Family Medicine

## 2014-04-24 NOTE — Telephone Encounter (Signed)
Yes, she gets a NS fee

## 2014-04-24 NOTE — Telephone Encounter (Signed)
Pt had 2 mth fu, did not show up. Should we charge no show?

## 2014-04-28 ENCOUNTER — Ambulatory Visit (INDEPENDENT_AMBULATORY_CARE_PROVIDER_SITE_OTHER): Payer: BLUE CROSS/BLUE SHIELD | Admitting: Family Medicine

## 2014-04-28 ENCOUNTER — Encounter: Payer: Self-pay | Admitting: Family Medicine

## 2014-04-28 VITALS — BP 118/78 | HR 70 | Temp 98.6°F | Resp 16 | Wt 209.0 lb

## 2014-04-28 DIAGNOSIS — G43011 Migraine without aura, intractable, with status migrainosus: Secondary | ICD-10-CM

## 2014-04-28 DIAGNOSIS — J0100 Acute maxillary sinusitis, unspecified: Secondary | ICD-10-CM | POA: Insufficient documentation

## 2014-04-28 MED ORDER — FLUOXETINE HCL 20 MG PO TABS: 20.0000 mg | ORAL_TABLET | Freq: Every day | ORAL | Status: DC

## 2014-04-28 MED ORDER — DOXYCYCLINE HYCLATE 100 MG PO TABS: 100.0000 mg | ORAL_TABLET | Freq: Two times a day (BID) | ORAL | Status: DC

## 2014-04-28 MED ORDER — KETOROLAC TROMETHAMINE 60 MG/2ML IM SOLN
60.0000 mg | Freq: Once | INTRAMUSCULAR | Status: AC
  Administered 2014-04-28: 60 mg via INTRAMUSCULAR

## 2014-04-28 MED ORDER — PROMETHAZINE HCL 25 MG PO TABS
25.0000 mg | ORAL_TABLET | Freq: Three times a day (TID) | ORAL | Status: DC | PRN
Start: 2014-04-28 — End: 2015-01-23

## 2014-04-28 NOTE — Assessment & Plan Note (Signed)
Deteriorated.  Suspect this is due to pt's current sinus infxn.  toradol injxn given in office today to break migraine cycle.  Script given for phenergan.  Reviewed supportive care and red flags that should prompt return.  Pt expressed understanding and is in agreement w/ plan.

## 2014-04-28 NOTE — Patient Instructions (Signed)
Follow up as needed Start the Doxycycline twice daily for sinus infection- take w/ food Drink plenty of fluids Use the promethazine as needed for nausea The toradol will hopefully kick in and break the migraine cycle REST! Call with any questions or concerns Hang in there!

## 2014-04-28 NOTE — Assessment & Plan Note (Signed)
New.  Pt's sxs and PE consistent w/ infxn.  Suspect this is also her current migraine trigger.  Start abx.  Reviewed supportive care and red flags that should prompt return.  Pt expressed understanding and is in agreement w/ plan.

## 2014-04-28 NOTE — Progress Notes (Signed)
Pre visit review using our clinic review tool, if applicable. No additional management support is needed unless otherwise documented below in the visit note. 

## 2014-04-28 NOTE — Progress Notes (Signed)
   Subjective:    Patient ID: Kristina Neal, female    DOB: 21-Mar-1976, 39 y.o.   MRN: [id number]  HPI Migraine- pt reports migraines started after last menstrual cycle on 1/25.  Pt having daily HAs- 'go to bed w/ one, wake up w/ one'.  'not able to work'.  + nausea, anorexia.  + photo and phonophobia.  + dizziness.  Pt reports 'sometimes it feels the same and sometimes it doesn't' when compared to other migraines.  Intermittent subjective fevers.  + sinus pain/pressure.  No tooth pain.  + sick contacts.  No ear pain.   Review of Systems For ROS see HPI      Objective:   Physical Exam  Constitutional: She appears well-developed and well-nourished. No distress.  HENT:  Head: Normocephalic and atraumatic.  Right Ear: Tympanic membrane normal.  Left Ear: Tympanic membrane normal.  Nose: Mucosal edema and rhinorrhea present. Right sinus exhibits maxillary sinus tenderness. Right sinus exhibits no frontal sinus tenderness. Left sinus exhibits maxillary sinus tenderness. Left sinus exhibits no frontal sinus tenderness.  Mouth/Throat: Uvula is midline and mucous membranes are normal. Posterior oropharyngeal erythema present. No oropharyngeal exudate.  Eyes: Conjunctivae and EOM are normal. Pupils are equal, round, and reactive to light.  Neck: Normal range of motion. Neck supple.  Cardiovascular: Normal rate, regular rhythm and normal heart sounds.   Pulmonary/Chest: Effort normal and breath sounds normal. No respiratory distress. She has no wheezes.  Lymphadenopathy:    She has no cervical adenopathy.  Vitals reviewed.         Assessment & Plan:

## 2014-05-06 ENCOUNTER — Emergency Department (HOSPITAL_COMMUNITY)
Admission: EM | Admit: 2014-05-06 | Discharge: 2014-05-06 | Disposition: A | Discharge status: Home / Self Care | Payer: BLUE CROSS/BLUE SHIELD | Attending: Emergency Medicine | Admitting: Emergency Medicine

## 2014-05-06 ENCOUNTER — Emergency Department (HOSPITAL_COMMUNITY): Payer: BLUE CROSS/BLUE SHIELD

## 2014-05-06 ENCOUNTER — Encounter (HOSPITAL_COMMUNITY): Payer: Self-pay

## 2014-05-06 DIAGNOSIS — Z794 Long term (current) use of insulin: Secondary | ICD-10-CM | POA: Diagnosis not present

## 2014-05-06 DIAGNOSIS — E119 Type 2 diabetes mellitus without complications: Secondary | ICD-10-CM | POA: Insufficient documentation

## 2014-05-06 DIAGNOSIS — Z79899 Other long term (current) drug therapy: Secondary | ICD-10-CM | POA: Diagnosis not present

## 2014-05-06 DIAGNOSIS — J45909 Unspecified asthma, uncomplicated: Secondary | ICD-10-CM | POA: Diagnosis not present

## 2014-05-06 DIAGNOSIS — R519 Headache, unspecified: Secondary | ICD-10-CM

## 2014-05-06 DIAGNOSIS — K219 Gastro-esophageal reflux disease without esophagitis: Secondary | ICD-10-CM | POA: Insufficient documentation

## 2014-05-06 DIAGNOSIS — Z880 Allergy status to penicillin: Secondary | ICD-10-CM | POA: Diagnosis not present

## 2014-05-06 DIAGNOSIS — J0100 Acute maxillary sinusitis, unspecified: Secondary | ICD-10-CM | POA: Insufficient documentation

## 2014-05-06 DIAGNOSIS — Z792 Long term (current) use of antibiotics: Secondary | ICD-10-CM | POA: Insufficient documentation

## 2014-05-06 DIAGNOSIS — D649 Anemia, unspecified: Secondary | ICD-10-CM | POA: Diagnosis not present

## 2014-05-06 DIAGNOSIS — R51 Headache: Secondary | ICD-10-CM | POA: Diagnosis present

## 2014-05-06 DIAGNOSIS — Z87891 Personal history of nicotine dependence: Secondary | ICD-10-CM | POA: Diagnosis not present

## 2014-05-06 LAB — I-STAT CHEM 8, ED
BUN: 14 mg/dL (ref 6–23)
CALCIUM ION: 1.11 mmol/L — AB (ref 1.12–1.23)
CREATININE: 0.5 mg/dL (ref 0.50–1.10)
Chloride: 99 mmol/L (ref 96–112)
Glucose, Bld: 73 mg/dL (ref 70–99)
HCT: 40 % (ref 36.0–46.0)
Hemoglobin: 13.6 g/dL (ref 12.0–15.0)
Potassium: 3.4 mmol/L — ABNORMAL LOW (ref 3.5–5.1)
Sodium: 143 mmol/L (ref 135–145)
TCO2: 22 mmol/L (ref 0–100)

## 2014-05-06 LAB — CBG MONITORING, ED: Glucose-Capillary: 61 mg/dL — ABNORMAL LOW (ref 70–99)

## 2014-05-06 LAB — I-STAT BETA HCG BLOOD, ED (MC, WL, AP ONLY): I-stat hCG, quantitative: <5 m[IU]/mL (ref <5)

## 2014-05-06 MED ORDER — HYDROMORPHONE HCL 1 MG/ML IJ SOLN: 0.5000 mg | Freq: Once | INTRAMUSCULAR | Status: DC

## 2014-05-06 MED ORDER — MAGNESIUM SULFATE 2 GM/50ML IV SOLN
2.0000 g | Freq: Once | INTRAVENOUS | Status: AC
  Administered 2014-05-06: 2 g via INTRAVENOUS
  Filled 2014-05-06: qty 50

## 2014-05-06 MED ORDER — DIPHENHYDRAMINE HCL 50 MG/ML IJ SOLN
25.0000 mg | Freq: Once | INTRAMUSCULAR | Status: AC
  Administered 2014-05-06: 25 mg via INTRAVENOUS
  Filled 2014-05-06: qty 1

## 2014-05-06 MED ORDER — PROCHLORPERAZINE EDISYLATE 5 MG/ML IJ SOLN
10.0000 mg | Freq: Once | INTRAMUSCULAR | Status: AC
  Administered 2014-05-06: 10 mg via INTRAVENOUS
  Filled 2014-05-06: qty 2

## 2014-05-06 MED ORDER — KETOROLAC TROMETHAMINE 15 MG/ML IJ SOLN
15.0000 mg | Freq: Once | INTRAMUSCULAR | Status: AC
  Administered 2014-05-06: 15 mg via INTRAVENOUS
  Filled 2014-05-06: qty 1

## 2014-05-06 MED ORDER — DEXAMETHASONE SODIUM PHOSPHATE 10 MG/ML IJ SOLN
10.0000 mg | Freq: Once | INTRAMUSCULAR | Status: AC
  Administered 2014-05-06: 10 mg via INTRAVENOUS
  Filled 2014-05-06: qty 1

## 2014-05-06 MED ORDER — SODIUM CHLORIDE 0.9 % IV BOLUS (SEPSIS)
1000.0000 mL | Freq: Once | INTRAVENOUS | Status: AC
  Administered 2014-05-06: 1000 mL via INTRAVENOUS

## 2014-05-06 MED ORDER — HALOPERIDOL LACTATE 5 MG/ML IJ SOLN
5.0000 mg | Freq: Once | INTRAMUSCULAR | Status: AC
  Administered 2014-05-06: 5 mg via INTRAVENOUS
  Filled 2014-05-06: qty 1

## 2014-05-06 MED ORDER — OXYCODONE-ACETAMINOPHEN 5-325 MG PO TABS: ORAL_TABLET | ORAL | Status: DC

## 2014-05-06 NOTE — ED Notes (Signed)
Patient transported to CT 

## 2014-05-06 NOTE — ED Provider Notes (Signed)
CSN: [id number]     Arrival date & time 05/06/14  0757 History   First MD Initiated Contact with Patient 05/06/14 [phone]     Chief Complaint  Patient presents with  . Headache     (Consider location/radiation/quality/duration/timing/severity/associated sxs/prior Treatment) HPI  Kristina Neal is a 39 y.o. female with past medical history significant for insulin-dependent diabetes, anemia complaining of headache exacerbation onset 4 weeks ago. Patient states that she normally gets a frontal headache when she states, states that this normally resolves however has not over the last month. Patient was seen at her primary care office and is being treated for a sinusitis with doxycycline and Imitrex and Phenergan for her headache however it is not easing the pain. Patient endorses both photophobia, phonophobia, subjective fever, nausea. She denies cervicalgia, neck stiffness, rash, chest pain, shortness of breath, exacerbation of headache with exertion, Valsalva or in the morning, change in vision, dysarthria, ataxia.   Past Medical History  Diagnosis Date  . Diabetes mellitus without complication   . Asthma   . GERD (gastroesophageal reflux disease)   . Anemia    Past Surgical History  Procedure Laterality Date  . Cervical biopsy  w/ loop electrode excision    . Whipple procedure  2008  . Cervical tumor removed    . Dilation and curettage of uterus  2012  . Leep    . Robot assisted myomectomy N/A 08/23/2013    Procedure: ROBOTIC ASSISTED MYOMECTOMY;  Surgeon: Lahoma Crocker, MD;  Location: [facility];  Service: Gynecology;  Laterality: N/A;   Family History  Problem Relation Age of Onset  . Stroke Mother   . Hypertension Mother   . Diabetes Father   . Hypertension Maternal Grandmother   . Dementia Maternal Grandmother   . Cancer Maternal Grandfather     bone  . Diabetes Paternal Grandmother   . Hypertension Paternal Grandmother   . Prostate cancer Paternal Grandfather    . Colon cancer Neg Hx   . Breast cancer Paternal Aunt   . Heart disease Maternal Grandmother   . Heart disease Paternal Grandmother   . Colon polyps Neg Hx   . Esophageal cancer Neg Hx    History  Substance Use Topics  . Smoking status: Former Smoker    Types: Cigarettes    Quit date: 03/21/2006  . Smokeless tobacco: Never Used  . Alcohol Use: 0.0 oz/week    0 Standard drinks or equivalent per week     Comment: occasionally   OB History    Gravida Para Term Preterm AB TAB SAB Ectopic Multiple Living   4    4  4    0     Review of Systems  10 systems reviewed and found to be negative, except as noted in the HPI.   Allergies  Sulfa antibiotics and Amoxicillin  Home Medications   Prior to Admission medications   Medication Sig Start Date End Date Taking? Authorizing Provider  acetaminophen (TYLENOL) 325 MG tablet Take 650 mg by mouth every 6 (six) hours as needed (for pain.).     Historical Provider, MD  albuterol (PROVENTIL HFA;VENTOLIN HFA) 108 (90 BASE) MCG/ACT inhaler Inhale 1-2 puffs into the lungs every 6 (six) hours as needed for wheezing or shortness of breath. 02/05/14   Mariea Clonts, MD  Cyanocobalamin (VITAMIN B-12 CR PO) Take 1 tablet by mouth daily.     Historical Provider, MD  doxycycline (VIBRA-TABS) 100 MG tablet Take 1 tablet (100 mg total) by  mouth 2 (two) times daily. 04/28/14   Midge Minium, MD  FLUoxetine (PROZAC) 20 MG tablet Take 1 tablet (20 mg total) by mouth daily. 04/28/14   Midge Minium, MD  folic acid (FOLVITE) 1 MG tablet Take 1 mg by mouth daily.    Historical Provider, MD  glucose blood (ONE TOUCH ULTRA TEST) test strip 1 each by Other route 2 (two) times daily. And lancets 2/day. 03/03/14   Renato Shin, MD  insulin detemir (LEVEMIR) 100 UNIT/ML injection Inject 0.3 mLs (30 Units total) into the skin every morning. And syringes 1/day 01/31/14   Renato Shin, MD  Iron Combinations (IRON COMPLEX PO) Take 1 capsule by mouth daily.      Historical Provider, MD  lipase/protease/amylase (CREON) 36000 UNITS CPEP capsule Take 2 capsules (72,000 Units total) by mouth 3 (three) times daily before meals. 01/14/14   Lafayette Dragon, MD  Mefenamic Acid 250 MG CAPS Take 250 mg by mouth as directed. Patient takes when needed during menstrual cycle 07/25/13   Historical Provider, MD  Melatonin 3 MG CAPS Take by mouth.    Historical Provider, MD  omeprazole (PRILOSEC) 40 MG capsule Take 40 mg by mouth daily as needed (for acid reflux).    Historical Provider, MD  Prenatal Vit-Fe Fumarate-FA (PRENATAL MULTIVITAMIN) TABS tablet Take 1 tablet by mouth daily at 12 noon.    Historical Provider, MD  promethazine (PHENERGAN) 25 MG tablet Take 1 tablet (25 mg total) by mouth every 8 (eight) hours as needed for nausea or vomiting. 04/28/14   Midge Minium, MD  SUMAtriptan (IMITREX) 50 MG tablet Take 1 tablet (50 mg total) by mouth once. May repeat in 2 hours if headache persists or recurs. 02/21/14   Midge Minium, MD  vitamin C (ASCORBIC ACID) 500 MG tablet Take 500 mg by mouth daily.     Historical Provider, MD  Vitamin D, Ergocalciferol, (DRISDOL) 50000 UNITS CAPS capsule Take 50,000 units po weekly x 12 weeks 02/20/14   Lafayette Dragon, MD   BP 105/57 mmHg  Pulse 78  Temp(Src) 98.6 F (37 C) (Oral)  Resp 18  SpO2 100%  LMP 04/10/2014 Physical Exam  Constitutional: She is oriented to person, place, and time. She appears well-developed and well-nourished. No distress.  HENT:  Head: Normocephalic and atraumatic.  Mouth/Throat: Oropharynx is clear and moist.  No drooling or stridor. Posterior pharynx mildly erythematous no significant tonsillar hypertrophy. No exudate. Soft palate rises symmetrically. No TTP or induration under tongue.   ++ bilateral maxillary sinuses.  No mucosal edema in the nares.  Bilateral tympanic membranes with normal architecture and good light reflex.    Eyes: Conjunctivae and EOM are normal. Pupils are equal,  round, and reactive to light.  Neck: Normal range of motion.  No midline C-spine  tenderness to palpation or step-offs appreciated. Patient has full range of motion without pain.   Cardiovascular: Normal rate, regular rhythm and intact distal pulses.   Pulmonary/Chest: Effort normal and breath sounds normal. No stridor.  Abdominal: Soft. Bowel sounds are normal.  Musculoskeletal: Normal range of motion.  Neurological: She is alert and oriented to person, place, and time.  Follows commands, Clear, goal oriented speech, Strength is 5 out of 5x4 extremities, patient ambulates with a coordinated in nonantalgic gait. Sensation is grossly intact.   Psychiatric: She has a normal mood and affect.  Nursing note and vitals reviewed.   ED Course  Procedures (including critical care time) Labs Review Labs  Reviewed  I-STAT CHEM 8, ED - Abnormal; Notable for the following:    Potassium 3.4 (*)    Calcium, Ion 1.11 (*)    All other components within normal limits  CBG MONITORING, ED - Abnormal; Notable for the following:    Glucose-Capillary 61 (*)    All other components within normal limits  I-STAT BETA HCG BLOOD, ED ([facility], [facility], [facility] ONLY)    Imaging Review Ct Head Wo Contrast  05/06/2014   CLINICAL DATA:  Headache.  Initial encounter.  Headache for 1 month.  EXAM: CT HEAD WITHOUT CONTRAST  TECHNIQUE: Contiguous axial images were obtained from the base of the skull through the vertex without intravenous contrast.  COMPARISON:  None.  FINDINGS: No mass lesion, mass effect, midline shift, hydrocephalus, hemorrhage. No territorial ischemia or acute infarction. The visible paranasal sinuses and mastoid air cells are normal.  IMPRESSION: Negative CT head.   Electronically Signed   By: Dereck Ligas M.D.   On: 05/06/2014 10:21     EKG Interpretation None      MDM   Final diagnoses:  Bad headache  Acute maxillary sinusitis, recurrence not specified    Filed Vitals:   05/06/14 0812 05/06/14  1034 05/06/14 1149  BP: 105/57 95/54 95/57   Pulse: 78 72   Temp: 98.6 F (37 C) 99.5 F (37.5 C)   TempSrc: Oral Oral   Resp: 18 18 15   SpO2: 100% 100% 100%    Medications  sodium chloride 0.9 % bolus 1,000 mL (1,000 mLs Intravenous New Bag/Given 05/06/14 0910)  magnesium sulfate IVPB 2 g 50 mL (2 g Intravenous New Bag/Given 05/06/14 0910)  ketorolac (TORADOL) 15 MG/ML injection 15 mg (not administered)  prochlorperazine (COMPAZINE) injection 10 mg (10 mg Intravenous Given 05/06/14 0910)  diphenhydrAMINE (BENADRYL) injection 25 mg (25 mg Intravenous Given 05/06/14 0910)  dexamethasone (DECADRON) injection 10 mg (10 mg Intravenous Given 05/06/14 0910)    Kristina Neal is a pleasant 39 y.o. female presenting with 4 weeks of headache. Neuro exam is nonfocal. Findings consistent with a maxillary sinusitis for which she is being treated with doxycycline. Patient is given headache cocktail of Compazine, Benadryl and Decadron and her pain remains constant at 7 out of 10, Toradol and magnesium given and pain is still 7 out of 10. Patient is given Haldol 5 mg IV and she reports that her pain is improved to 5 out of 10. Half a milligram of Dilaudid given for rescue. She's never seen a neurologist. I have advised her to follow with [facility]. Her head CT today is negative. Low index of suspicion for [facility].   Evaluation does not show pathology that would require ongoing emergent intervention or inpatient treatment. Pt is hemodynamically stable and mentating appropriately. Discussed findings and plan with patient/guardian, who agrees with care plan. All questions answered. Return precautions discussed and outpatient follow up given.   Discharge Medication List as of 05/06/2014 11:44 AM    START taking these medications   Details  oxyCODONE-acetaminophen (PERCOCET/ROXICET) 5-325 MG per tablet 1 to 2 tabs PO q6hrs  PRN for pain, Print             Monico Blitz, PA-C 05/06/14  1451  Debby Freiberg, MD 05/09/14 1622

## 2014-05-06 NOTE — Discharge Instructions (Signed)
Take percocet for breakthrough pain, do not drink alcohol, drive, care for children or do other critical tasks while taking percocet.  Please follow with your primary care doctor in the next 2 days for a check-up. They must obtain records for further management.   Do not hesitate to return to the Emergency Department for any new, worsening or concerning symptoms.   Use nasal saline (you can try [employer] Simply Saline) at least 4 times a day, use saline 5-10 minutes before using the fluticasone (flonase) nasal spray  Do not use Afrin (Oxymetazoline)  Rest, wash hands frequently  and drink plenty of water.  You may try counter medication such as Mucinex or Sudafed decongestant.   General Headache Without Cause A general headache is pain or discomfort felt around the head or neck area. The cause may not be found.  HOME CARE   Keep all doctor visits.  Only take medicines as told by your doctor.  Lie down in a dark, quiet room when you have a headache.  Keep a journal to find out if certain things bring on headaches. For example, write down:  What you eat and drink.  How much sleep you get.  Any change to your diet or medicines.  Relax by getting a massage or doing other relaxing activities.  Put ice or heat packs on the head and neck area as told by your doctor.  Lessen stress.  Sit up straight. Do not tighten (tense) your muscles.  Quit smoking if you smoke.  Lessen how much alcohol you drink.  Lessen how much caffeine you drink, or stop drinking caffeine.  Eat and sleep on a regular schedule.  Get 7 to 9 hours of sleep, or as told by your doctor.  Keep lights dim if bright lights bother you or make your headaches worse. GET HELP RIGHT AWAY IF:   Your headache becomes really bad.  You have a fever.  You have a stiff neck.  You have trouble seeing.  Your muscles are weak, or you lose muscle control.  You lose your balance or have trouble  walking.  You feel like you will pass out (faint), or you pass out.  You have really bad symptoms that are different than your first symptoms.  You have problems with the medicines given to you by your doctor.  Your medicines do not work.  Your headache feels different than the other headaches.  You feel sick to your stomach (nauseous) or throw up (vomit). MAKE SURE YOU:   Understand these instructions.  Will watch your condition.  Will get help right away if you are not doing well or get worse. Document Released: 12/15/2007 Document Revised: 05/30/2011 Document Reviewed: 02/25/2011 [facility] Patient Information 2015 [place], [place]. This information is not intended to replace advice given to you by your health care provider. Make sure you discuss any questions you have with your health care provider.  Sinusitis Sinusitis is redness, soreness, and inflammation of the paranasal sinuses. Paranasal sinuses are air pockets within the bones of your face (beneath the eyes, the middle of the forehead, or above the eyes). In healthy paranasal sinuses, mucus is able to drain out, and air is able to circulate through them by way of your nose. However, when your paranasal sinuses are inflamed, mucus and air can become trapped. This can allow bacteria and other germs to grow and cause infection. Sinusitis can develop quickly and last only a short time (acute) or continue over a  long period (chronic). Sinusitis that lasts for more than 12 weeks is considered chronic.  CAUSES  Causes of sinusitis include:  Allergies.  Structural abnormalities, such as displacement of the cartilage that separates your nostrils (deviated septum), which can decrease the air flow through your nose and sinuses and affect sinus drainage.  Functional abnormalities, such as when the small hairs (cilia) that line your sinuses and help remove mucus do not work properly or are not present. SIGNS AND SYMPTOMS  Symptoms of  acute and chronic sinusitis are the same. The primary symptoms are pain and pressure around the affected sinuses. Other symptoms include:  Upper toothache.  Earache.  Headache.  Bad breath.  Decreased sense of smell and taste.  A cough, which worsens when you are lying flat.  Fatigue.  Fever.  Thick drainage from your nose, which often is green and may contain pus (purulent).  Swelling and warmth over the affected sinuses. DIAGNOSIS  Your health care provider will perform a physical exam. During the exam, your health care provider may:  Look in your nose for signs of abnormal growths in your nostrils (nasal polyps).  Tap over the affected sinus to check for signs of infection.  View the inside of your sinuses (endoscopy) using an imaging device that has a light attached (endoscope). If your health care provider suspects that you have chronic sinusitis, one or more of the following tests may be recommended:  Allergy tests.  Nasal culture. A sample of mucus is taken from your nose, sent to a lab, and screened for bacteria.  Nasal cytology. A sample of mucus is taken from your nose and examined by your health care provider to determine if your sinusitis is related to an allergy. TREATMENT  Most cases of acute sinusitis are related to a viral infection and will resolve on their own within 10 days. Sometimes medicines are prescribed to help relieve symptoms (pain medicine, decongestants, nasal steroid sprays, or saline sprays).  However, for sinusitis related to a bacterial infection, your health care provider will prescribe antibiotic medicines. These are medicines that will help kill the bacteria causing the infection.  Rarely, sinusitis is caused by a fungal infection. In theses cases, your health care provider will prescribe antifungal medicine. For some cases of chronic sinusitis, surgery is needed. Generally, these are cases in which sinusitis recurs more than 3 times per  year, despite other treatments. HOME CARE INSTRUCTIONS   Drink plenty of water. Water helps thin the mucus so your sinuses can drain more easily.  Use a humidifier.  Inhale steam 3 to 4 times a day (for example, sit in the bathroom with the shower running).  Apply a warm, moist washcloth to your face 3 to 4 times a day, or as directed by your health care provider.  Use saline nasal sprays to help moisten and clean your sinuses.  Take medicines only as directed by your health care provider.  If you were prescribed either an antibiotic or antifungal medicine, finish it all even if you start to feel better. SEEK IMMEDIATE MEDICAL CARE IF:  You have increasing pain or severe headaches.  You have nausea, vomiting, or drowsiness.  You have swelling around your face.  You have vision problems.  You have a stiff neck.  You have difficulty breathing. MAKE SURE YOU:   Understand these instructions.  Will watch your condition.  Will get help right away if you are not doing well or get worse. Document Released: 03/07/2005 Document Revised:  07/22/2013 Document Reviewed: 03/22/2011 [employer] Patient Information 2015 [place], [place]. This information is not intended to replace advice given to you by your health care provider. Make sure you discuss any questions you have with your health care provider.

## 2014-05-06 NOTE — ED Notes (Signed)
Pt states she was seen a week ago for a headache.  DX with sinus infection and given antibiotic.  Pt states meds not helping pain.  She has had continued headache with nausea and some dizziness with the pain.  Pt denies being given pain meds.  Only antibiotic and phenergan.

## 2014-05-22 ENCOUNTER — Encounter: Payer: Self-pay | Admitting: Family Medicine

## 2014-05-22 ENCOUNTER — Ambulatory Visit (INDEPENDENT_AMBULATORY_CARE_PROVIDER_SITE_OTHER): Payer: BLUE CROSS/BLUE SHIELD | Admitting: Family Medicine

## 2014-05-22 VITALS — BP 112/82 | HR 101 | Temp 98.0°F | Resp 16 | Wt 211.0 lb

## 2014-05-22 DIAGNOSIS — F32A Depression, unspecified: Secondary | ICD-10-CM

## 2014-05-22 DIAGNOSIS — F329 Major depressive disorder, single episode, unspecified: Secondary | ICD-10-CM

## 2014-05-22 DIAGNOSIS — Z3200 Encounter for pregnancy test, result unknown: Secondary | ICD-10-CM | POA: Insufficient documentation

## 2014-05-22 DIAGNOSIS — G43009 Migraine without aura, not intractable, without status migrainosus: Secondary | ICD-10-CM

## 2014-05-22 LAB — HCG, QUANTITATIVE, PREGNANCY: Quantitative HCG: 0.68 m[IU]/mL

## 2014-05-22 MED ORDER — TOPIRAMATE 25 MG PO TABS: ORAL_TABLET | ORAL | Status: DC

## 2014-05-22 MED ORDER — RIZATRIPTAN BENZOATE 10 MG PO TABS: 10.0000 mg | ORAL_TABLET | ORAL | Status: DC | PRN

## 2014-05-22 NOTE — Progress Notes (Signed)
Pre visit review using our clinic review tool, if applicable. No additional management support is needed unless otherwise documented below in the visit note. 

## 2014-05-22 NOTE — Patient Instructions (Signed)
Follow up in 2-4 weeks to recheck mood and migraines We'll notify you of your lab results Start the Topamax as directed Use the Maxalt as needed for migraine rescue Continue the Prozac daily for now Call with any questions or concerns Hang in there!

## 2014-05-22 NOTE — Progress Notes (Signed)
   Subjective:    Patient ID: Kristina Neal, female    DOB: 24-Feb-1976, 39 y.o.   MRN: [id number]  HPI Migraines- pt went to ER on 2/16 for migraine that didn't respond to imitrex.  Pt has had subsequent migraines (2) which improved w/ Percocet.  Pt reports Imitrex has always worked previously.  Pt reports migraine sxs were similar to previous.  Pt is having ~1 migraine/week.  Depression- pt reports Prozac is causing weight gain.  Has started exercising but still have difficulty w/ weight.  'this week was not a good week.  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    Possibility of pregnancy- condom broke 2-3 weeks ago.  Concerned for possible pregnancy.  LMP 2/17.   Review of Systems For ROS see HPI     Objective:   Physical Exam  Constitutional: She is oriented to person, place, and time. She appears well-developed and well-nourished. No distress.  HENT:  Head: Normocephalic and atraumatic.  TMs WNL No TTP over sinuses Minimal nasal congestion  Eyes: Conjunctivae and EOM are normal. Pupils are equal, round, and reactive to light.  Neck: Normal range of motion. Neck supple.  Cardiovascular: Normal rate, regular rhythm, normal heart sounds and intact distal pulses.   Pulmonary/Chest: Effort normal and breath sounds normal. No respiratory distress. She has no wheezes. She has no rales.  Lymphadenopathy:    She has no cervical adenopathy.  Neurological: She is alert and oriented to person, place, and time. She has normal reflexes. No cranial nerve deficit. Coordination normal.  Psychiatric: She has a normal mood and affect. Her behavior is normal. Judgment and thought content normal.  Vitals reviewed.         Assessment & Plan:

## 2014-05-23 ENCOUNTER — Other Ambulatory Visit: Payer: Self-pay | Admitting: Family Medicine

## 2014-05-23 DIAGNOSIS — Z3200 Encounter for pregnancy test, result unknown: Secondary | ICD-10-CM

## 2014-05-23 NOTE — Assessment & Plan Note (Signed)
Unchanged from previous.  Pt feels prozac is helping w/ her mood but is concerned that this is causing weight gain.  Since pt is interested in starting Topamax for migraine prevention and I don't want to make too many med changes at once, will continue Prozac at this time but will consider changing at future visits.  Pt expressed understanding and is in agreement w/ plan.

## 2014-05-23 NOTE — Assessment & Plan Note (Signed)
New.  Due to fact that condom broke will get bHCG as pt has not yet missed her period.

## 2014-05-23 NOTE — Assessment & Plan Note (Signed)
Deteriorated.  No longer responding to Imitrex.  Based on this, will start Topamax as a prophylaxis and change Imitrex to Maxalt.  Reviewed supportive care and red flags that should prompt return.  Pt expressed understanding and is in agreement w/ plan.

## 2014-05-26 ENCOUNTER — Other Ambulatory Visit (INDEPENDENT_AMBULATORY_CARE_PROVIDER_SITE_OTHER): Payer: BLUE CROSS/BLUE SHIELD

## 2014-05-26 DIAGNOSIS — Z3200 Encounter for pregnancy test, result unknown: Secondary | ICD-10-CM

## 2014-05-27 LAB — HCG, QUANTITATIVE, PREGNANCY: Quantitative HCG: 0.93 m[IU]/mL

## 2014-05-28 ENCOUNTER — Telehealth: Payer: Self-pay | Admitting: Family Medicine

## 2014-05-28 DIAGNOSIS — N926 Irregular menstruation, unspecified: Secondary | ICD-10-CM

## 2014-05-28 NOTE — Telephone Encounter (Signed)
Caller name:Jasinski, Gaylin Relation to DE:YCXK Call back number:[phone] Pharmacy:  Reason for call: pt states she reviewed her lab results on my chart and has some questions and would like for you to call her.

## 2014-05-29 NOTE — Telephone Encounter (Signed)
We can repeat beta HCG tomorrow to see if number is climbing.  I understand her concern.  It's just very difficult since we are checking before she even missed her period

## 2014-05-29 NOTE — Telephone Encounter (Signed)
Pt notified and appt made for Thursday of next week, Pt wanted to wait until then to have labs completed.

## 2014-05-29 NOTE — Telephone Encounter (Signed)
Called pt and LMOVM to return call.  °

## 2014-05-29 NOTE — Telephone Encounter (Signed)
Pt notified of results numbers. Pt is just worried due to numbers increasing from 0.3 to 0.9.

## 2014-06-02 LAB — HM PAP SMEAR: HM Pap smear: NORMAL

## 2014-06-03 ENCOUNTER — Encounter (HOSPITAL_COMMUNITY): Payer: Self-pay

## 2014-06-03 ENCOUNTER — Emergency Department (HOSPITAL_COMMUNITY): Payer: BLUE CROSS/BLUE SHIELD

## 2014-06-03 ENCOUNTER — Emergency Department (HOSPITAL_COMMUNITY)
Admission: EM | Admit: 2014-06-03 | Discharge: 2014-06-03 | Disposition: A | Discharge status: Home / Self Care | Payer: BLUE CROSS/BLUE SHIELD | Attending: Emergency Medicine | Admitting: Emergency Medicine

## 2014-06-03 DIAGNOSIS — D649 Anemia, unspecified: Secondary | ICD-10-CM | POA: Insufficient documentation

## 2014-06-03 DIAGNOSIS — Z79899 Other long term (current) drug therapy: Secondary | ICD-10-CM | POA: Diagnosis not present

## 2014-06-03 DIAGNOSIS — Z8719 Personal history of other diseases of the digestive system: Secondary | ICD-10-CM | POA: Insufficient documentation

## 2014-06-03 DIAGNOSIS — G629 Polyneuropathy, unspecified: Secondary | ICD-10-CM | POA: Insufficient documentation

## 2014-06-03 DIAGNOSIS — Z880 Allergy status to penicillin: Secondary | ICD-10-CM | POA: Insufficient documentation

## 2014-06-03 DIAGNOSIS — J45909 Unspecified asthma, uncomplicated: Secondary | ICD-10-CM | POA: Diagnosis not present

## 2014-06-03 DIAGNOSIS — Z87891 Personal history of nicotine dependence: Secondary | ICD-10-CM | POA: Diagnosis not present

## 2014-06-03 DIAGNOSIS — Z794 Long term (current) use of insulin: Secondary | ICD-10-CM | POA: Insufficient documentation

## 2014-06-03 DIAGNOSIS — R202 Paresthesia of skin: Secondary | ICD-10-CM | POA: Diagnosis present

## 2014-06-03 DIAGNOSIS — E119 Type 2 diabetes mellitus without complications: Secondary | ICD-10-CM | POA: Diagnosis not present

## 2014-06-03 LAB — CBG MONITORING, ED
GLUCOSE-CAPILLARY: 134 mg/dL — AB (ref 70–99)
Glucose-Capillary: 48 mg/dL — ABNORMAL LOW (ref 70–99)
Glucose-Capillary: 109 mg/dL — ABNORMAL HIGH (ref 70–99)

## 2014-06-03 NOTE — ED Notes (Signed)
CBG 134 

## 2014-06-03 NOTE — ED Notes (Signed)
Pt c/o waking up w/ tingling/ "pins and needle" feeling in R hand.  Denies pain.  Pt reports have these symptoms previously, but sts "it didn't last this long."  Hx of diabetes.  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

## 2014-06-03 NOTE — Discharge Instructions (Signed)

## 2014-06-03 NOTE — ED Notes (Signed)
Patient asked me to check her blood sugar bc she felt that it was low, it was 48, I notified the nurse and gave her 3 orange juice cups with gram crackers and peanut butter

## 2014-06-03 NOTE — ED Notes (Signed)
A sandwich was given aswell

## 2014-06-03 NOTE — ED Provider Notes (Signed)
CSN: [id number]     Arrival date & time 06/03/14  0820 History   First MD Initiated Contact with Patient 06/03/14 0845     Chief Complaint  Patient presents with  . Tingling     (Consider location/radiation/quality/duration/timing/severity/associated sxs/prior Treatment) HPI Comments: Patient reports that she woke this morning with numbness and tingling of her right hand. Since then, she has noticed improvement in her thumb and forefinger, but continues to have numbness in the other fingers. She has not noticed any significant weakness or discoordination. She denies headache. No neck pain or stiffness. She has not noticed any leg involvement.   Past Medical History  Diagnosis Date  . Diabetes mellitus without complication   . Asthma   . GERD (gastroesophageal reflux disease)   . Anemia    Past Surgical History  Procedure Laterality Date  . Cervical biopsy  w/ loop electrode excision    . Whipple procedure  2008  . Cervical tumor removed    . Dilation and curettage of uterus  2012  . Leep    . Robot assisted myomectomy N/A 08/23/2013    Procedure: ROBOTIC ASSISTED MYOMECTOMY;  Surgeon: Lahoma Crocker, MD;  Location: [facility];  Service: Gynecology;  Laterality: N/A;   Family History  Problem Relation Age of Onset  . Stroke Mother   . Hypertension Mother   . Diabetes Father   . Hypertension Maternal Grandmother   . Dementia Maternal Grandmother   . Cancer Maternal Grandfather     bone  . Diabetes Paternal Grandmother   . Hypertension Paternal Grandmother   . Prostate cancer Paternal Grandfather   . Colon cancer Neg Hx   . Breast cancer Paternal Aunt   . Heart disease Maternal Grandmother   . Heart disease Paternal Grandmother   . Colon polyps Neg Hx   . Esophageal cancer Neg Hx    History  Substance Use Topics  . Smoking status: Former Smoker    Types: Cigarettes    Quit date: 03/21/2006  . Smokeless tobacco: Never Used  . Alcohol Use: 0.0 oz/week    0 Standard  drinks or equivalent per week     Comment: occasionally   OB History    Gravida Para Term Preterm AB TAB SAB Ectopic Multiple Living   4    4  4    0     Review of Systems  Neurological: Positive for numbness.  All other systems reviewed and are negative.     Allergies  Sulfa antibiotics and Amoxicillin  Home Medications   Prior to Admission medications   Medication Sig Start Date End Date Taking? Authorizing Provider  albuterol (PROVENTIL HFA;VENTOLIN HFA) 108 (90 BASE) MCG/ACT inhaler Inhale 1-2 puffs into the lungs every 6 (six) hours as needed for wheezing or shortness of breath. 02/05/14  Yes Elnora Morrison, MD  ferrous sulfate 325 (65 FE) MG tablet Take 325 mg by mouth daily with breakfast.   Yes Historical Provider, MD  FLUoxetine (PROZAC) 20 MG capsule Take 20 mg by mouth daily.  05/17/14  Yes Historical Provider, MD  glucose blood (ONE TOUCH ULTRA TEST) test strip 1 each by Other route 2 (two) times daily. And lancets 2/day. 03/03/14  Yes Renato Shin, MD  insulin detemir (LEVEMIR) 100 UNIT/ML injection Inject 0.3 mLs (30 Units total) into the skin every morning. And syringes 1/day Patient taking differently: Inject 30 Units into the skin daily. And syringes 1/day 01/31/14  Yes Renato Shin, MD  lipase/protease/amylase (CREON) 36000 UNITS CPEP  capsule Take 2 capsules (72,000 Units total) by mouth 3 (three) times daily before meals. 01/14/14  Yes Lafayette Dragon, MD  Mefenamic Acid 250 MG CAPS Take 250 mg by mouth as directed. Patient takes when needed during menstrual cycle 07/25/13  Yes Historical Provider, MD  Melatonin 3 MG CAPS Take 3 mg by mouth at bedtime.    Yes Historical Provider, MD  oxyCODONE-acetaminophen (PERCOCET/ROXICET) 5-325 MG per tablet 1 to 2 tabs PO q6hrs  PRN for pain Patient taking differently: Take 1-2 tablets by mouth every 6 (six) hours as needed for moderate pain.  05/06/14  Yes Nicole Pisciotta, PA-C  Prenatal Vit-Fe Fumarate-FA (PRENATAL MULTIVITAMIN)  TABS tablet Take 1 tablet by mouth daily at 12 noon.   Yes Historical Provider, MD  promethazine (PHENERGAN) 25 MG tablet Take 1 tablet (25 mg total) by mouth every 8 (eight) hours as needed for nausea or vomiting. 04/28/14  Yes Midge Minium, MD  RELION INSULIN SYR 0.3ML/31G 31G X 5/16" 0.3 ML MISC  03/25/14  Yes Historical Provider, MD  rizatriptan (MAXALT) 10 MG tablet Take 1 tablet (10 mg total) by mouth as needed for migraine. May repeat in 2 hours if needed 05/22/14  Yes Midge Minium, MD  topiramate (TOPAMAX) 25 MG tablet 25mg  QHS x 1 week, then 25mg  BID x1 week, then 25mg  qAM and 50mg  QHS x1 week, and then 50mg  BID Patient taking differently: 50 mg 2 (two) times daily. 25mg  QHS x 1 week, then 25mg  BID x1 week, then 25mg  qAM and 50mg  QHS x1 week, and then 50mg  BID 05/22/14  Yes Midge Minium, MD  SUMAtriptan (IMITREX) 50 MG tablet Take 1 tablet (50 mg total) by mouth once. May repeat in 2 hours if headache persists or recurs. Patient not taking: Reported on 06/03/2014 02/21/14   Midge Minium, MD  vitamin C (ASCORBIC ACID) 500 MG tablet Take 500 mg by mouth daily.     Historical Provider, MD  Vitamin D, Ergocalciferol, (DRISDOL) 50000 UNITS CAPS capsule Take 50,000 units po weekly x 12 weeks Patient not taking: Reported on 06/03/2014 02/20/14   Lafayette Dragon, MD   BP 121/58 mmHg  Pulse 70  Temp(Src) 98.2 F (36.8 C) (Oral)  Resp 18  SpO2 99%  LMP 05/07/2014 Physical Exam  Constitutional: She is oriented to person, place, and time. She appears well-developed and well-nourished. No distress.  HENT:  Head: Normocephalic and atraumatic.  Right Ear: Hearing normal.  Left Ear: Hearing normal.  Nose: Nose normal.  Mouth/Throat: Oropharynx is clear and moist and mucous membranes are normal.  Eyes: Conjunctivae and EOM are normal. Pupils are equal, round, and reactive to light.  Neck: Normal range of motion. Neck supple.  Cardiovascular: Regular rhythm, S1 normal and S2 normal.   Exam reveals no gallop and no friction rub.   No murmur heard. Pulmonary/Chest: Effort normal and breath sounds normal. No respiratory distress. She exhibits no tenderness.  Abdominal: Soft. Normal appearance and bowel sounds are normal. There is no hepatosplenomegaly. There is no tenderness. There is no rebound, no guarding, no tenderness at McBurney's point and negative Murphy's sign. No hernia.  Musculoskeletal: Normal range of motion.  Neurological: She is alert and oriented to person, place, and time. She has normal strength. No cranial nerve deficit or sensory deficit. Coordination normal. GCS eye subscore is 4. GCS verbal subscore is 5. GCS motor subscore is 6.  Extraocular muscle movement: normal No visual field cut Pupils: equal and reactive both  direct and consensual response is normal No nystagmus present    Sensory function is intact to light touch, pinprick Proprioception intact  Grip strength 5/5 symmetric in upper extremities No pronator drift Normal finger to nose bilaterally  Lower extremity strength 5/5 against gravity Normal heel to shin bilaterally  Gait: normal  Normal sensation to light touch across all three nerve distribution right hand. Normal flexion, extension, abduction, abduction, opposition in hand.  Skin: Skin is warm, dry and intact. No rash noted. No cyanosis.  Psychiatric: She has a normal mood and affect. Her speech is normal and behavior is normal. Thought content normal.  Nursing note and vitals reviewed.   ED Course  Procedures (including critical care time) Labs Review Labs Reviewed  CBG MONITORING, ED - Abnormal; Notable for the following:    Glucose-Capillary 48 (*)    All other components within normal limits  CBG MONITORING, ED - Abnormal; Notable for the following:    Glucose-Capillary 109 (*)    All other components within normal limits    Imaging Review Mr Brain Wo Contrast  06/03/2014   CLINICAL DATA:  RIGHT arm numbness and  tingling beginning earlier today. Initial encounter.  EXAM: MRI HEAD WITHOUT CONTRAST  TECHNIQUE: Multiplanar, multiecho pulse sequences of the brain and surrounding structures were obtained without intravenous contrast.  COMPARISON:  CT head 05/06/2014.  FINDINGS: No evidence for acute infarction, hemorrhage, mass lesion, hydrocephalus, or extra-axial fluid. Normal cerebral volume. No white matter disease. Flow voids are maintained throughout the carotid, basilar, and vertebral arteries. There are no areas of chronic hemorrhage. Pituitary, pineal, and cerebellar tonsils unremarkable. No upper cervical lesions. Visualized calvarium, skull base, and upper cervical osseous structures unremarkable. Scalp and extracranial soft tissues, orbits, sinuses, and mastoids show no acute process.  IMPRESSION: Negative exam.  No acute intracranial findings.   Electronically Signed   By: Rolla Flatten M.D.   On: 06/03/2014 10:50     EKG Interpretation None      MDM   Final diagnoses:  Peripheral neuropathy    Patient presents to the ER for evaluation of pins and needles in the right hand upon awakening. Symptoms certainly improved over the course of the morning but still present upon arrival. She did not, however, have any neurologic deficit apparent on examination. She is not experiencing any neck pain or signs of cervical radiculopathy.   Patient underwent MRI of brain here in the ER and there was no evidence of stroke. Symptoms do not support diagnosis of TIA. 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 Patient woke up with this problem. She is improving over time. Will refer to neurology for further evaluation.  Of mild hypoglycemia here in the ER. She does have a history of diabetes. This was treated with food.    Orpah Greek, MD 06/03/14 1630

## 2014-06-04 ENCOUNTER — Encounter: Payer: Self-pay | Admitting: General Practice

## 2014-06-05 ENCOUNTER — Other Ambulatory Visit (INDEPENDENT_AMBULATORY_CARE_PROVIDER_SITE_OTHER): Payer: BLUE CROSS/BLUE SHIELD

## 2014-06-05 DIAGNOSIS — N926 Irregular menstruation, unspecified: Secondary | ICD-10-CM

## 2014-06-10 LAB — HCG, SERUM, QUALITATIVE

## 2014-06-10 LAB — HCG, QUANTITATIVE, PREGNANCY: Quantitative HCG: 0.2 m[IU]/mL

## 2014-06-10 NOTE — Addendum Note (Signed)
Addended by: Modena Morrow D on: 06/10/2014 03:11 PM   Modules accepted: Orders

## 2014-06-11 NOTE — Progress Notes (Signed)
Pt does not need labs, already completed.

## 2014-06-12 ENCOUNTER — Encounter: Payer: Self-pay | Admitting: Endocrinology

## 2014-06-12 ENCOUNTER — Ambulatory Visit (INDEPENDENT_AMBULATORY_CARE_PROVIDER_SITE_OTHER): Payer: BLUE CROSS/BLUE SHIELD | Admitting: Endocrinology

## 2014-06-12 VITALS — BP 112/62 | HR 64 | Temp 98.7°F | Ht 69.0 in | Wt 205.0 lb

## 2014-06-12 DIAGNOSIS — E1065 Type 1 diabetes mellitus with hyperglycemia: Secondary | ICD-10-CM | POA: Diagnosis not present

## 2014-06-12 DIAGNOSIS — IMO0002 Reserved for concepts with insufficient information to code with codable children: Secondary | ICD-10-CM

## 2014-06-12 LAB — HEMOGLOBIN A1C: HEMOGLOBIN A1C: 6.8 % — AB (ref 4.6–6.5)

## 2014-06-12 MED ORDER — INSULIN DETEMIR 100 UNIT/ML ~~LOC~~ SOLN: 28.0000 [IU] | Freq: Every day | SUBCUTANEOUS | Status: DC

## 2014-06-12 NOTE — Patient Instructions (Addendum)
check your blood sugar twice a day.  vary the time of day when you check, between before the 3 meals, and at bedtime.  also check if you have symptoms of your blood sugar being too high or too low.  please keep a record of the readings and bring it to your next appointment here.  You can write it on any piece of paper.  please call us sooner if your blood sugar goes below 70, or if you have a lot of readings over 200.   In view of your medical condition, you should avoid pregnancy until we have decided it is safe.    blood tests are being requested for you today.  We'll let you know about the results. On this type of insulin schedule, you should eat meals on a regular schedule.  If a meal is missed or significantly delayed, your blood sugar could go low. It is better to take all of the insulin in the morning.   Please come back for a follow-up appointment in 2 months.

## 2014-06-12 NOTE — Progress Notes (Signed)
Subjective:    Patient ID: Kristina Neal, female    DOB: October 09, 1975, 39 y.o.   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   Dx'ed: 7341 Complications: none Therapy: insulin since 2011, when she had DKA.   GDM: never DKA: as above, and again in 2014. Severe hypoglycemia: never Pancreatitis: never.   Other: due to noncompliance, she is on a simple insulin schedule.  Interval history: no cbg record, but states cbg's are mildly low approx "once in a while."  There is no trend throughout the day. She says she never misses the insulin.  She sometimes divides the insulin bid.  Past Medical History  Diagnosis Date  . Diabetes mellitus without complication   . Asthma   . GERD (gastroesophageal reflux disease)   . Anemia     Past Surgical History  Procedure Laterality Date  . Cervical biopsy  w/ loop electrode excision    . Whipple procedure  2008  . Cervical tumor removed    . Dilation and curettage of uterus  2012  . Leep    . Robot assisted myomectomy N/A 08/23/2013    Procedure: ROBOTIC ASSISTED MYOMECTOMY;  Surgeon: Lahoma Crocker, MD;  Location: [facility];  Service: Gynecology;  Laterality: N/A;    History   Social History  . Marital Status: Legally Separated    Spouse Name: N/A  . Number of Children: 0  . Years of Education: N/A   Occupational History  . CSR    Social History Main Topics  . Smoking status: Former Smoker    Types: Cigarettes    Quit date: 03/21/2006  . Smokeless tobacco: Never Used  . Alcohol Use: 0.0 oz/week    0 Standard drinks or equivalent per week     Comment: occasionally  . Drug Use: No  . Sexual Activity:    Partners: Male    Birth Control/ Protection: None   Other Topics Concern  . Not on file   Social History Narrative    Current Outpatient Prescriptions on File Prior to Visit  Medication Sig Dispense Refill  .  albuterol (PROVENTIL HFA;VENTOLIN HFA) 108 (90 BASE) MCG/ACT inhaler Inhale 1-2 puffs into the lungs every 6 (six) hours as needed for wheezing or shortness of breath. 1 Inhaler 0  . ferrous sulfate 325 (65 FE) MG tablet Take 325 mg by mouth daily with breakfast.    . FLUoxetine (PROZAC) 20 MG capsule Take 20 mg by mouth daily.     Marland Kitchen glucose blood (ONE TOUCH ULTRA TEST) test strip 1 each by Other route 2 (two) times daily. And lancets 2/day. 100 each 12  . lipase/protease/amylase (CREON) 36000 UNITS CPEP capsule Take 2 capsules (72,000 Units total) by mouth 3 (three) times daily before meals. 180 capsule 2  . Mefenamic Acid 250 MG CAPS Take 250 mg by mouth as directed. Patient takes when needed during menstrual cycle    . Melatonin 3 MG CAPS Take 3 mg by mouth at bedtime.     Marland Kitchen oxyCODONE-acetaminophen (PERCOCET/ROXICET) 5-325 MG per tablet 1 to 2 tabs PO q6hrs  PRN for pain (Patient taking differently: Take 1-2 tablets by mouth every 6 (six) hours as needed for moderate pain. ) 15 tablet 0  . Prenatal Vit-Fe Fumarate-FA (PRENATAL MULTIVITAMIN) TABS tablet Take 1 tablet by mouth daily at 12 noon.    . promethazine (PHENERGAN) 25 MG  tablet Take 1 tablet (25 mg total) by mouth every 8 (eight) hours as needed for nausea or vomiting. 20 tablet 0  . RELION INSULIN SYR 0.3ML/31G 31G X 5/16" 0.3 ML MISC     . rizatriptan (MAXALT) 10 MG tablet Take 1 tablet (10 mg total) by mouth as needed for migraine. May repeat in 2 hours if needed 10 tablet 3  . SUMAtriptan (IMITREX) 50 MG tablet Take 1 tablet (50 mg total) by mouth once. May repeat in 2 hours if headache persists or recurs. (Patient not taking: Reported on 06/03/2014) 10 tablet 3  . topiramate (TOPAMAX) 25 MG tablet 25mg  QHS x 1 week, then 25mg  BID x1 week, then 25mg  qAM and 50mg  QHS x1 week, and then 50mg  BID (Patient taking differently: 50 mg 2 (two) times daily. 25mg  QHS x 1 week, then 25mg  BID x1 week, then 25mg  qAM and 50mg  QHS x1 week, and then 50mg   BID) 120 tablet 3  . vitamin C (ASCORBIC ACID) 500 MG tablet Take 500 mg by mouth daily.     . Vitamin D, Ergocalciferol, (DRISDOL) 50000 UNITS CAPS capsule Take 50,000 units po weekly x 12 weeks (Patient not taking: Reported on 06/03/2014) 12 capsule 0   No current facility-administered medications on file prior to visit.    Allergies  Allergen Reactions  . Sulfa Antibiotics Other (See Comments)    Causes headache.  . Amoxicillin Hives and Rash    Family History  Problem Relation Age of Onset  . Stroke Mother   . Hypertension Mother   . Diabetes Father   . Hypertension Maternal Grandmother   . Dementia Maternal Grandmother   . Cancer Maternal Grandfather     bone  . Diabetes Paternal Grandmother   . Hypertension Paternal Grandmother   . Prostate cancer Paternal Grandfather   . Colon cancer Neg Hx   . Breast cancer Paternal Aunt   . Heart disease Maternal Grandmother   . Heart disease Paternal Grandmother   . Colon polyps Neg Hx   . Esophageal cancer Neg Hx     BP 112/62 mmHg  Pulse 64  Temp(Src) 98.7 F (37.1 C) (Oral)  Ht 5\' 9"  (1.753 m)  Wt 205 lb (92.987 kg)  BMI 30.26 kg/m2  SpO2 98%  LMP 05/07/2014  Review of Systems She denies hypoglycemia.      Objective:   Physical Exam VITAL SIGNS:  See vs page GENERAL: no distress Pulses: dorsalis pedis intact bilat.   MSK: no deformity of the feet CV: no leg edema Skin:  no ulcer on the feet.  normal color and temp on the feet. Neuro: sensation is intact to touch on the feet.    Lab Results  Component Value Date   HGBA1C 6.8* 06/12/2014       Assessment & Plan:  DM: slightly overcontrolled  Patient is advised the following: Patient Instructions  check your blood sugar twice a day.  vary the time of day when you check, between before the 3 meals, and at bedtime.  also check if you have symptoms of your blood sugar being too high or too low.  please keep a record of the readings and bring it to your next  appointment here.  You can write it on any piece of paper.  please call us sooner if your blood sugar goes below 70, or if you have a lot of readings over 200.   In view of your medical condition, you should avoid pregnancy until we  have decided it is safe.    blood tests are being requested for you today.  We'll let you know about the results. On this type of insulin schedule, you should eat meals on a regular schedule.  If a meal is missed or significantly delayed, your blood sugar could go low. It is better to take all of the insulin in the morning.   Please come back for a follow-up appointment in 2 months.

## 2014-06-19 ENCOUNTER — Ambulatory Visit (INDEPENDENT_AMBULATORY_CARE_PROVIDER_SITE_OTHER): Payer: BLUE CROSS/BLUE SHIELD | Admitting: Family Medicine

## 2014-06-19 ENCOUNTER — Encounter: Payer: Self-pay | Admitting: Family Medicine

## 2014-06-19 VITALS — BP 122/84 | HR 85 | Temp 98.0°F | Resp 16 | Wt 197.1 lb

## 2014-06-19 DIAGNOSIS — F329 Major depressive disorder, single episode, unspecified: Secondary | ICD-10-CM | POA: Diagnosis not present

## 2014-06-19 DIAGNOSIS — G43009 Migraine without aura, not intractable, without status migrainosus: Secondary | ICD-10-CM | POA: Diagnosis not present

## 2014-06-19 DIAGNOSIS — F32A Depression, unspecified: Secondary | ICD-10-CM

## 2014-06-19 MED ORDER — CITALOPRAM HYDROBROMIDE 40 MG PO TABS: 40.0000 mg | ORAL_TABLET | Freq: Every day | ORAL | Status: DC

## 2014-06-19 NOTE — Progress Notes (Signed)
Pre visit review using our clinic review tool, if applicable. No additional management support is needed unless otherwise documented below in the visit note. 

## 2014-06-19 NOTE — Progress Notes (Signed)
   Subjective:    Patient ID: Kristina Neal, female    DOB: 1975-12-06, 39 y.o.   MRN: [id number]  HPI Depression- 'i have literally shut down'.  Doing ok at work but is withdrawn socially, 'i don't want to be bothered'.  'crying fits'.  'extremely angry'.  Pt has reached out to [employer] at work- 3 recommended 3 people but pt didn't feel they were a good fit based on online research.  Migraines- pt was started on Topamax at last visit.  Pt reports migraines have improved.  Less frequent.  Also had acupuncture ear piercing which she feels was helpful.   Review of Systems For ROS see HPI     Objective:   Physical Exam  Constitutional: She is oriented to person, place, and time. She appears well-developed and well-nourished. No distress.  HENT:  Head: Normocephalic and atraumatic.  Eyes: Conjunctivae and EOM are normal. Pupils are equal, round, and reactive to light.  Neurological: She is alert and oriented to person, place, and time.  Skin: Skin is warm and dry.  Psychiatric: Her behavior is normal. Judgment and thought content normal.  Flat affect  Vitals reviewed.         Assessment & Plan:

## 2014-06-19 NOTE — Patient Instructions (Signed)
Call and schedule an appt w/ Dr Caprice Beaver [phone]) or Dr Toy Care [phone]) STOP the prozac START the Celexa 40mg  daily If after 2-3 weeks, the celexa is working, cancel your psychiatry appt.  If it's not working, you'll already have the appt in place Please follow up on the [facility] counseling If you are having very dark thoughts- please call the crisis line 24 hrs a day [phone] Call with any questions or concerns Hang in there!!

## 2014-06-22 NOTE — Assessment & Plan Note (Signed)
Improved since starting Topamax.

## 2014-06-22 NOTE — Assessment & Plan Note (Signed)
Deteriorated.  Pt's prozac is not effective and she is not interested in increasing dose due to weight gain.  Discussed switching to Effexor but friend had very pad experience and pt is not interested in trying.  Will switch to Celexa and monitor for improvement.  Pt is to cal and schedule counseling w/ Melinda Crutch and also call and schedule an appt w/ psychiatrist in case this med change is not effective.  Pt expressed understanding and is in agreement w/ plan.

## 2014-07-07 ENCOUNTER — Other Ambulatory Visit: Payer: Self-pay | Admitting: Internal Medicine

## 2014-07-09 ENCOUNTER — Telehealth: Payer: Self-pay | Admitting: Family Medicine

## 2014-07-09 MED ORDER — CLONAZEPAM 0.5 MG PO TABS: 0.5000 mg | ORAL_TABLET | Freq: Two times a day (BID) | ORAL | Status: DC | PRN

## 2014-07-09 NOTE — Telephone Encounter (Signed)
Patient returned phone call. °

## 2014-07-09 NOTE — Telephone Encounter (Signed)
Called pt and LMOVM to return call.  °

## 2014-07-09 NOTE — Telephone Encounter (Signed)
Med filled and faxed. Pt notified.

## 2014-07-09 NOTE — Telephone Encounter (Signed)
Caller name:Nellie Curenton Relationship to patient:self Can be reached:[phone] Pharmacy: [facility] on [address]  Reason for call: PT's father passed last week and not able to sleep- states no thoughts of physical harm just anxious- would like to speak with Janett Billow or Dr. Birdie Riddle regarding possibility of rx called in for sleeping assistance. Off work after 1pm best time to call.

## 2014-07-09 NOTE — Telephone Encounter (Signed)
Called and spoke with pt. She advised that she is trying to hang in there. Feels as though the anxiety and stress is making her have insomnia. Pt is open to a short term medication and knows you will send in what is best.

## 2014-07-09 NOTE — Telephone Encounter (Signed)
[place] for [employer] 0.5mg  BID prn anxiety or sleep, #20, no refills

## 2014-07-22 ENCOUNTER — Encounter: Payer: Self-pay | Admitting: General Practice

## 2014-08-08 ENCOUNTER — Encounter (HOSPITAL_COMMUNITY): Payer: Self-pay | Admitting: Emergency Medicine

## 2014-08-08 ENCOUNTER — Emergency Department (HOSPITAL_COMMUNITY)
Admission: EM | Admit: 2014-08-08 | Discharge: 2014-08-09 | Disposition: A | Discharge status: Home / Self Care | Payer: BLUE CROSS/BLUE SHIELD | Attending: Emergency Medicine | Admitting: Emergency Medicine

## 2014-08-08 DIAGNOSIS — Z9889 Other specified postprocedural states: Secondary | ICD-10-CM | POA: Insufficient documentation

## 2014-08-08 DIAGNOSIS — Z87891 Personal history of nicotine dependence: Secondary | ICD-10-CM | POA: Diagnosis not present

## 2014-08-08 DIAGNOSIS — D649 Anemia, unspecified: Secondary | ICD-10-CM | POA: Diagnosis not present

## 2014-08-08 DIAGNOSIS — R1030 Lower abdominal pain, unspecified: Secondary | ICD-10-CM | POA: Diagnosis not present

## 2014-08-08 DIAGNOSIS — E119 Type 2 diabetes mellitus without complications: Secondary | ICD-10-CM | POA: Insufficient documentation

## 2014-08-08 DIAGNOSIS — J45909 Unspecified asthma, uncomplicated: Secondary | ICD-10-CM | POA: Insufficient documentation

## 2014-08-08 DIAGNOSIS — Z880 Allergy status to penicillin: Secondary | ICD-10-CM | POA: Insufficient documentation

## 2014-08-08 DIAGNOSIS — Z8719 Personal history of other diseases of the digestive system: Secondary | ICD-10-CM | POA: Insufficient documentation

## 2014-08-08 DIAGNOSIS — Z79899 Other long term (current) drug therapy: Secondary | ICD-10-CM | POA: Diagnosis not present

## 2014-08-08 DIAGNOSIS — Z794 Long term (current) use of insulin: Secondary | ICD-10-CM | POA: Insufficient documentation

## 2014-08-08 DIAGNOSIS — R102 Pelvic and perineal pain: Secondary | ICD-10-CM | POA: Diagnosis not present

## 2014-08-08 NOTE — ED Notes (Addendum)
Pt from home c/o lower abdominal pain  Sharp in character since Thursday.  Denies nausea or diarrhea. Denies urinary symptoms or vaginal discharge.

## 2014-08-09 ENCOUNTER — Emergency Department (HOSPITAL_COMMUNITY)
Admission: EM | Admit: 2014-08-09 | Discharge: 2014-08-09 | Disposition: A | Discharge status: Home / Self Care | Payer: BLUE CROSS/BLUE SHIELD | Attending: Emergency Medicine | Admitting: Emergency Medicine

## 2014-08-09 ENCOUNTER — Encounter (HOSPITAL_COMMUNITY): Payer: Self-pay | Admitting: *Deleted

## 2014-08-09 ENCOUNTER — Emergency Department (HOSPITAL_COMMUNITY): Payer: BLUE CROSS/BLUE SHIELD

## 2014-08-09 DIAGNOSIS — M545 Low back pain: Secondary | ICD-10-CM | POA: Diagnosis not present

## 2014-08-09 DIAGNOSIS — D649 Anemia, unspecified: Secondary | ICD-10-CM | POA: Insufficient documentation

## 2014-08-09 DIAGNOSIS — E119 Type 2 diabetes mellitus without complications: Secondary | ICD-10-CM | POA: Insufficient documentation

## 2014-08-09 DIAGNOSIS — Z880 Allergy status to penicillin: Secondary | ICD-10-CM | POA: Insufficient documentation

## 2014-08-09 DIAGNOSIS — R1030 Lower abdominal pain, unspecified: Secondary | ICD-10-CM | POA: Insufficient documentation

## 2014-08-09 DIAGNOSIS — Z87891 Personal history of nicotine dependence: Secondary | ICD-10-CM | POA: Insufficient documentation

## 2014-08-09 DIAGNOSIS — Z79899 Other long term (current) drug therapy: Secondary | ICD-10-CM | POA: Diagnosis not present

## 2014-08-09 DIAGNOSIS — J45909 Unspecified asthma, uncomplicated: Secondary | ICD-10-CM | POA: Diagnosis not present

## 2014-08-09 DIAGNOSIS — R102 Pelvic and perineal pain: Secondary | ICD-10-CM | POA: Diagnosis not present

## 2014-08-09 DIAGNOSIS — Z8719 Personal history of other diseases of the digestive system: Secondary | ICD-10-CM | POA: Insufficient documentation

## 2014-08-09 DIAGNOSIS — Z794 Long term (current) use of insulin: Secondary | ICD-10-CM | POA: Insufficient documentation

## 2014-08-09 DIAGNOSIS — R109 Unspecified abdominal pain: Secondary | ICD-10-CM

## 2014-08-09 LAB — CBC WITH DIFFERENTIAL/PLATELET
BASOS PCT: 0 % (ref 0–1)
Basophils Absolute: 0 10*3/uL (ref 0.0–0.1)
Basophils Absolute: 0 10*3/uL (ref 0.0–0.1)
Basophils Relative: 0 % (ref 0–1)
EOS ABS: 0.1 10*3/uL (ref 0.0–0.7)
EOS PCT: 3 % (ref 0–5)
Eosinophils Absolute: 0.2 10*3/uL (ref 0.0–0.7)
Eosinophils Relative: 2 % (ref 0–5)
HCT: 36.5 % (ref 36.0–46.0)
HCT: 36.9 % (ref 36.0–46.0)
Hemoglobin: 11.8 g/dL — ABNORMAL LOW (ref 12.0–15.0)
Hemoglobin: 11.9 g/dL — ABNORMAL LOW (ref 12.0–15.0)
LYMPHS ABS: 1.6 10*3/uL (ref 0.7–4.0)
Lymphocytes Relative: 26 % (ref 12–46)
Lymphocytes Relative: 29 % (ref 12–46)
Lymphs Abs: 2 10*3/uL (ref 0.7–4.0)
MCH: 28.1 pg (ref 26.0–34.0)
MCH: 28.4 pg (ref 26.0–34.0)
MCHC: 32 g/dL (ref 30.0–36.0)
MCHC: 32.6 g/dL (ref 30.0–36.0)
MCV: 87.1 fL (ref 78.0–100.0)
MCV: 87.9 fL (ref 78.0–100.0)
MONO ABS: 0.4 10*3/uL (ref 0.1–1.0)
Monocytes Absolute: 0.5 10*3/uL (ref 0.1–1.0)
Monocytes Relative: 6 % (ref 3–12)
Monocytes Relative: 8 % (ref 3–12)
NEUTROS PCT: 60 % (ref 43–77)
Neutro Abs: 3.9 10*3/uL (ref 1.7–7.7)
Neutro Abs: 4.1 10*3/uL (ref 1.7–7.7)
Neutrophils Relative %: 66 % (ref 43–77)
Platelets: 167 10*3/uL (ref 150–400)
Platelets: 170 10*3/uL (ref 150–400)
RBC: 4.19 MIL/uL (ref 3.87–5.11)
RBC: 4.2 MIL/uL (ref 3.87–5.11)
RDW: 13.6 % (ref 11.5–15.5)
RDW: 13.7 % (ref 11.5–15.5)
WBC: 5.9 10*3/uL (ref 4.0–10.5)
WBC: 6.7 10*3/uL (ref 4.0–10.5)

## 2014-08-09 LAB — COMPREHENSIVE METABOLIC PANEL
ALT: 18 U/L (ref 14–54)
ALT: 17 U/L (ref 14–54)
ANION GAP: 10 (ref 5–15)
AST: 27 U/L (ref 15–41)
AST: 36 U/L (ref 15–41)
Albumin: 4.1 g/dL (ref 3.5–5.0)
Albumin: 3.6 g/dL (ref 3.5–5.0)
Alkaline Phosphatase: 50 U/L (ref 38–126)
Alkaline Phosphatase: 48 U/L (ref 38–126)
Anion gap: 10 (ref 5–15)
BILIRUBIN TOTAL: 0.3 mg/dL (ref 0.3–1.2)
BUN: 7 mg/dL (ref 6–20)
BUN: <5 mg/dL — ABNORMAL LOW (ref 6–20)
CALCIUM: 8.4 mg/dL — AB (ref 8.9–10.3)
CO2: 22 mmol/L (ref 22–32)
CO2: 22 mmol/L (ref 22–32)
CREATININE: 0.67 mg/dL (ref 0.44–1.00)
Calcium: 8.5 mg/dL — ABNORMAL LOW (ref 8.9–10.3)
Chloride: 105 mmol/L (ref 101–111)
Chloride: 105 mmol/L (ref 101–111)
Creatinine, Ser: 0.56 mg/dL (ref 0.44–1.00)
GFR calc Af Amer: >60 mL/min (ref >60)
GFR calc non Af Amer: >60 mL/min (ref >60)
GLUCOSE: 156 mg/dL — AB (ref 65–99)
Glucose, Bld: 197 mg/dL — ABNORMAL HIGH (ref 65–99)
POTASSIUM: 3.9 mmol/L (ref 3.5–5.1)
Potassium: 3.9 mmol/L (ref 3.5–5.1)
Sodium: 137 mmol/L (ref 135–145)
Sodium: 137 mmol/L (ref 135–145)
TOTAL PROTEIN: 7.4 g/dL (ref 6.5–8.1)
Total Bilirubin: 0.7 mg/dL (ref 0.3–1.2)
Total Protein: 6.7 g/dL (ref 6.5–8.1)

## 2014-08-09 LAB — WET PREP, GENITAL
Clue Cells Wet Prep HPF POC: NONE SEEN
Trich, Wet Prep: NONE SEEN
YEAST WET PREP: NONE SEEN

## 2014-08-09 LAB — I-STAT BETA HCG BLOOD, ED (MC, WL, AP ONLY)

## 2014-08-09 LAB — URINALYSIS, ROUTINE W REFLEX MICROSCOPIC
BILIRUBIN URINE: NEGATIVE
Bilirubin Urine: NEGATIVE
GLUCOSE, UA: NEGATIVE mg/dL
Glucose, UA: 100 mg/dL — AB
Hgb urine dipstick: NEGATIVE
Hgb urine dipstick: NEGATIVE
Ketones, ur: NEGATIVE mg/dL
Ketones, ur: NEGATIVE mg/dL
LEUKOCYTES UA: NEGATIVE
Leukocytes, UA: NEGATIVE
Nitrite: NEGATIVE
Nitrite: NEGATIVE
PH: 6 (ref 5.0–8.0)
PH: 6 (ref 5.0–8.0)
PROTEIN: NEGATIVE mg/dL
Protein, ur: NEGATIVE mg/dL
SPECIFIC GRAVITY, URINE: 1.008 (ref 1.005–1.030)
SPECIFIC GRAVITY, URINE: 1.015 (ref 1.005–1.030)
UROBILINOGEN UA: 0.2 mg/dL (ref 0.0–1.0)
Urobilinogen, UA: 0.2 mg/dL (ref 0.0–1.0)

## 2014-08-09 LAB — LIPASE, BLOOD: Lipase: <10 U/L — ABNORMAL LOW (ref 22–51)

## 2014-08-09 MED ORDER — IOHEXOL 300 MG/ML  SOLN
25.0000 mL | Freq: Once | INTRAMUSCULAR | Status: AC | PRN
  Administered 2014-08-09: 25 mL via ORAL

## 2014-08-09 MED ORDER — HYDROCODONE-ACETAMINOPHEN 5-325 MG PO TABS: 1.0000 | ORAL_TABLET | Freq: Two times a day (BID) | ORAL | Status: DC | PRN

## 2014-08-09 MED ORDER — HYDROCODONE-ACETAMINOPHEN 5-325 MG PO TABS
2.0000 | ORAL_TABLET | Freq: Once | ORAL | Status: AC
  Administered 2014-08-09: 2 via ORAL
  Filled 2014-08-09: qty 2

## 2014-08-09 MED ORDER — MORPHINE SULFATE 4 MG/ML IJ SOLN
4.0000 mg | Freq: Once | INTRAMUSCULAR | Status: AC
  Administered 2014-08-09: 4 mg via INTRAMUSCULAR
  Filled 2014-08-09: qty 1

## 2014-08-09 MED ORDER — IOHEXOL 300 MG/ML  SOLN
80.0000 mL | Freq: Once | INTRAMUSCULAR | Status: AC | PRN
  Administered 2014-08-09: 80 mL via INTRAVENOUS

## 2014-08-09 MED ORDER — HYDROMORPHONE HCL 1 MG/ML IJ SOLN
1.0000 mg | Freq: Once | INTRAMUSCULAR | Status: AC
  Administered 2014-08-09: 1 mg via INTRAVENOUS
  Filled 2014-08-09: qty 1

## 2014-08-09 MED ORDER — IBUPROFEN 800 MG PO TABS
800.0000 mg | ORAL_TABLET | Freq: Once | ORAL | Status: AC
  Administered 2014-08-09: 800 mg via ORAL
  Filled 2014-08-09: qty 1

## 2014-08-09 NOTE — ED Provider Notes (Signed)
CSN: [id number]     Arrival date & time 08/09/14  1225 History   First MD Initiated Contact with Patient 08/09/14 1242     Chief Complaint  Patient presents with  . Abdominal Pain  . Back Pain      HPI Patient reports ongoing lower abdominal pain and pelvic pain as well as low back pain over the past several days.  She was seen at [facility] emergency department last night for similar symptoms.  No advanced imaging was obtained at that time.  She reports ongoing discomfort and pain at this time.  She is prescribed Vicodin at that time but has not had time to fill it.  She reports moderate lower abdominal pain this time.  No dysuria or urinary frequency.  Denies vaginal complaints.  She reports prior Whipple procedure.  She has a history of diabetes, GERD.  Denies fevers and chills.  No chest pain shortness of breath.  No other complaints.  Her pain is worse with movement and palpation of her lower abdomen.  She's had intermittent lower abdominal symptoms before.  She reports that she had a myomectomy in June 2015.  Since developing her lower abdominal discomfort she has not spoken with her gynecologist.   Past Medical History  Diagnosis Date  . Diabetes mellitus without complication   . Asthma   . GERD (gastroesophageal reflux disease)   . Anemia    Past Surgical History  Procedure Laterality Date  . Cervical biopsy  w/ loop electrode excision    . Whipple procedure  2008  . Cervical tumor removed    . Dilation and curettage of uterus  2012  . Leep    . Robot assisted myomectomy N/A 08/23/2013    Procedure: ROBOTIC ASSISTED MYOMECTOMY;  Surgeon: Lahoma Crocker, MD;  Location: [facility];  Service: Gynecology;  Laterality: N/A;   Family History  Problem Relation Age of Onset  . Stroke Mother   . Hypertension Mother   . Diabetes Father   . Hypertension Maternal Grandmother   . Dementia Maternal Grandmother   . Cancer Maternal Grandfather     bone  . Diabetes Paternal Grandmother    . Hypertension Paternal Grandmother   . Prostate cancer Paternal Grandfather   . Colon cancer Neg Hx   . Breast cancer Paternal Aunt   . Heart disease Maternal Grandmother   . Heart disease Paternal Grandmother   . Colon polyps Neg Hx   . Esophageal cancer Neg Hx    History  Substance Use Topics  . Smoking status: Former Smoker    Types: Cigarettes    Quit date: 03/21/2006  . Smokeless tobacco: Never Used  . Alcohol Use: 0.0 oz/week    0 Standard drinks or equivalent per week     Comment: occasionally   OB History    Gravida Para Term Preterm AB TAB SAB Ectopic Multiple Living   4    4  4    0     Review of Systems  All other systems reviewed and are negative.     Allergies  Sulfa antibiotics and Amoxicillin  Home Medications   Prior to Admission medications   Medication Sig Start Date End Date Taking? Authorizing Provider  albuterol (PROVENTIL HFA;VENTOLIN HFA) 108 (90 BASE) MCG/ACT inhaler Inhale 1-2 puffs into the lungs every 6 (six) hours as needed for wheezing or shortness of breath. 02/05/14  Yes Elnora Morrison, MD  citalopram (CELEXA) 40 MG tablet Take 1 tablet (40 mg total) by  mouth daily. 06/19/14  Yes Midge Minium, MD  clonazePAM (KLONOPIN) 0.5 MG tablet Take 1 tablet (0.5 mg total) by mouth 2 (two) times daily as needed for anxiety. 07/09/14  Yes Midge Minium, MD  CREON 36000 UNITS CPEP capsule TAKE TWO CAPSULES BY MOUTH THREE TIMES DAILY BEFORE MEAL(S) 07/08/14  Yes Lafayette Dragon, MD  ferrous sulfate 325 (65 FE) MG tablet Take 325 mg by mouth daily with breakfast.   Yes Historical Provider, MD  folic acid (FOLVITE) 1 MG tablet Take 1 mg by mouth daily.   Yes Historical Provider, MD  glucose blood (ONE TOUCH ULTRA TEST) test strip 1 each by Other route 2 (two) times daily. And lancets 2/day. 03/03/14  Yes Renato Shin, MD  insulin detemir (LEVEMIR) 100 UNIT/ML injection Inject 0.28 mLs (28 Units total) into the skin daily. And syringes 1/day Patient  taking differently: Inject 28 Units into the skin daily.  06/12/14  Yes Renato Shin, MD  Mefenamic Acid 250 MG CAPS Take 250 mg by mouth as directed. Patient takes when needed during menstrual cycle 07/25/13  Yes Historical Provider, MD  Melatonin 3 MG CAPS Take 3 mg by mouth at bedtime.    Yes Historical Provider, MD  Prenatal Vit-Fe Fumarate-FA (PRENATAL MULTIVITAMIN) TABS tablet Take 1 tablet by mouth daily at 12 noon.   Yes Historical Provider, MD  promethazine (PHENERGAN) 25 MG tablet Take 1 tablet (25 mg total) by mouth every 8 (eight) hours as needed for nausea or vomiting. 04/28/14  Yes Midge Minium, MD  RELION INSULIN SYR 0.5CC/30G 30G X 5/16" 0.5 ML MISC  06/12/14  Yes Historical Provider, MD  rizatriptan (MAXALT) 10 MG tablet Take 1 tablet (10 mg total) by mouth as needed for migraine. May repeat in 2 hours if needed 05/22/14  Yes Midge Minium, MD  topiramate (TOPAMAX) 25 MG tablet 25mg  QHS x 1 week, then 25mg  BID x1 week, then 25mg  qAM and 50mg  QHS x1 week, and then 50mg  BID Patient taking differently: Take 50 mg by mouth 2 (two) times daily. Pt takes two (2) 25mg  tablets in the morning and two (2) 25mg  tablets in the evening. 05/22/14  Yes Midge Minium, MD  vitamin C (ASCORBIC ACID) 500 MG tablet Take 500 mg by mouth daily.    Yes Historical Provider, MD  HYDROcodone-acetaminophen (NORCO/VICODIN) 5-325 MG per tablet Take 1 tablet by mouth 2 (two) times daily as needed for severe pain. Patient not taking: Reported on 08/09/2014 08/09/14   Everlene Balls, MD   BP 95/61 mmHg  Pulse 68  Temp(Src) 98.6 F (37 C) (Oral)  Resp 17  SpO2 100%  LMP 07/12/2014 Physical Exam  Constitutional: She is oriented to person, place, and time. She appears well-developed and well-nourished. No distress.  HENT:  Head: Normocephalic and atraumatic.  Eyes: EOM are normal.  Neck: Normal range of motion.  Cardiovascular: Normal rate, regular rhythm and normal heart sounds.   Pulmonary/Chest: Effort  normal and breath sounds normal.  Abdominal: Soft. She exhibits no distension.  Mild lower abdominal tenderness.  This is generalized and nonfocal.  Musculoskeletal: Normal range of motion.  Neurological: She is alert and oriented to person, place, and time.  Skin: Skin is warm and dry.  Psychiatric: She has a normal mood and affect. Judgment normal.  Nursing note and vitals reviewed.   ED Course  Procedures (including critical care time) Labs Review Labs Reviewed  CBC WITH DIFFERENTIAL/PLATELET - Abnormal; Notable for the following:  Hemoglobin 11.9 (*)    All other components within normal limits  COMPREHENSIVE METABOLIC PANEL - Abnormal; Notable for the following:    Glucose, Bld 156 (*)    BUN <5 (*)    Calcium 8.4 (*)    All other components within normal limits  URINALYSIS, ROUTINE W REFLEX MICROSCOPIC  I-STAT BETA HCG BLOOD, ED ([facility], [facility], [facility] ONLY)    Imaging Review Ct Abdomen Pelvis W Contrast  08/09/2014   CLINICAL DATA:  Lower abdominal pain, pelvic pain, nausea. History of Whipple procedure  EXAM: CT ABDOMEN AND PELVIS WITH CONTRAST  TECHNIQUE: Multidetector CT imaging of the abdomen and pelvis was performed using the standard protocol following bolus administration of intravenous contrast.  CONTRAST:  13mL OMNIPAQUE IOHEXOL 300 MG/ML  SOLN  COMPARISON:  None.  FINDINGS: Lower chest: Lung bases are clear. No effusions. Heart is normal size.  Hepatobiliary: Pneumobilia from prior Whipple procedure. Prior cholecystectomy. Area of low density in the left hepatic lobe adjacent to the falciform ligament. I favor this represents focal fat or area of altered perfusion.  Pancreas: Changes of prior Whipple procedure. No abnormality in the remaining distal pancreas.  Spleen: No focal abnormality.  Normal size.  Adrenals/Urinary Tract: No focal renal or adrenal abnormality. No hydronephrosis. Urinary bladder is unremarkable.  Stomach/Bowel: Stomach, large and small bowel grossly  unremarkable. Moderate stool burden in the colon. Appendix is visualized and unremarkable.  Vascular/Lymphatic: No retroperitoneal or mesenteric adenopathy. Aorta normal caliber.  Reproductive: No mass or other significant abnormality.  Other: No free fluid or free air.  Musculoskeletal: No focal bone lesion or acute bony abnormality.  IMPRESSION: Prior Whipple procedure.  Well demarcated curvilinear low-density in the left hepatic lobe adjacent to the falciform ligament. This has a benign appearance, favor focal fat or an area of altered perfusion. Given that the patient has had prior Whipple procedure, if this was due to pancreatic malignancy, this area in the liver could be further evaluated with MRI.   Electronically Signed   By: Rolm Baptise M.D.   On: 08/09/2014 16:17     EKG Interpretation None      MDM   Final diagnoses:  Abdominal pain, unspecified abdominal location    No acute pathology on CT imaging.  Labs are without significant abnormalities.  Patient be referred back to her primary care physician or gynecologist.  I've asked that she filled a prescription for Vicodin and return to the emergency department for any new or worsening symptoms.  Vital signs stable    Jola Schmidt, MD 08/11/14 [phone]

## 2014-08-09 NOTE — Discharge Instructions (Signed)

## 2014-08-09 NOTE — ED Notes (Signed)
Pt given PO contrast by CT tech.

## 2014-08-09 NOTE — ED Provider Notes (Signed)
CSN: [id number]     Arrival date & time 08/08/14  2334 History   First MD Initiated Contact with Patient 08/09/14 [phone]     Chief Complaint  Patient presents with  . Abdominal Pain     (Consider location/radiation/quality/duration/timing/severity/associated sxs/prior Treatment) HPI  Kristina Neal is a 39 y.o. female with past medical history of diabetes, asthma, anemia, GERD, presenting today with suprapubic abdominal pain. Patient states this began 2 days ago. Pain is sharp and radiates to her back. She took Aleve and Tylenol without significant relief. Patient has had nausea with no emesis. She denies any diarrhea. Patient has no urinary or vaginal complaints. Patient states her pain is not related to food. She does have a history of fibroids and states her pain is consistent with that. She has no further complaints.  10 Systems reviewed and are negative for acute change except as noted in the HPI.     Past Medical History  Diagnosis Date  . Diabetes mellitus without complication   . Asthma   . GERD (gastroesophageal reflux disease)   . Anemia    Past Surgical History  Procedure Laterality Date  . Cervical biopsy  w/ loop electrode excision    . Whipple procedure  2008  . Cervical tumor removed    . Dilation and curettage of uterus  2012  . Leep    . Robot assisted myomectomy N/A 08/23/2013    Procedure: ROBOTIC ASSISTED MYOMECTOMY;  Surgeon: Lahoma Crocker, MD;  Location: [facility];  Service: Gynecology;  Laterality: N/A;   Family History  Problem Relation Age of Onset  . Stroke Mother   . Hypertension Mother   . Diabetes Father   . Hypertension Maternal Grandmother   . Dementia Maternal Grandmother   . Cancer Maternal Grandfather     bone  . Diabetes Paternal Grandmother   . Hypertension Paternal Grandmother   . Prostate cancer Paternal Grandfather   . Colon cancer Neg Hx   . Breast cancer Paternal Aunt   . Heart disease Maternal Grandmother   . Heart  disease Paternal Grandmother   . Colon polyps Neg Hx   . Esophageal cancer Neg Hx    History  Substance Use Topics  . Smoking status: Former Smoker    Types: Cigarettes    Quit date: 03/21/2006  . Smokeless tobacco: Never Used  . Alcohol Use: 0.0 oz/week    0 Standard drinks or equivalent per week     Comment: occasionally   OB History    Gravida Para Term Preterm AB TAB SAB Ectopic Multiple Living   4    4  4    0     Review of Systems    Allergies  Sulfa antibiotics and Amoxicillin  Home Medications   Prior to Admission medications   Medication Sig Start Date End Date Taking? Authorizing Provider  albuterol (PROVENTIL HFA;VENTOLIN HFA) 108 (90 BASE) MCG/ACT inhaler Inhale 1-2 puffs into the lungs every 6 (six) hours as needed for wheezing or shortness of breath. 02/05/14  Yes Elnora Morrison, MD  citalopram (CELEXA) 40 MG tablet Take 1 tablet (40 mg total) by mouth daily. 06/19/14  Yes Midge Minium, MD  clonazePAM (KLONOPIN) 0.5 MG tablet Take 1 tablet (0.5 mg total) by mouth 2 (two) times daily as needed for anxiety. 07/09/14  Yes Midge Minium, MD  CREON 36000 UNITS CPEP capsule TAKE TWO CAPSULES BY MOUTH THREE TIMES DAILY BEFORE MEAL(S) 07/08/14  Yes Lafayette Dragon, MD  ferrous sulfate 325 (65 FE) MG tablet Take 325 mg by mouth daily with breakfast.   Yes Historical Provider, MD  folic acid (FOLVITE) 1 MG tablet Take 1 mg by mouth daily.   Yes Historical Provider, MD  insulin detemir (LEVEMIR) 100 UNIT/ML injection Inject 0.28 mLs (28 Units total) into the skin daily. And syringes 1/day Patient taking differently: Inject 28 Units into the skin daily.  06/12/14  Yes Renato Shin, MD  Mefenamic Acid 250 MG CAPS Take 250 mg by mouth as directed. Patient takes when needed during menstrual cycle 07/25/13  Yes Historical Provider, MD  Melatonin 3 MG CAPS Take 3 mg by mouth at bedtime.    Yes Historical Provider, MD  Prenatal Vit-Fe Fumarate-FA (PRENATAL MULTIVITAMIN) TABS  tablet Take 1 tablet by mouth daily at 12 noon.   Yes Historical Provider, MD  promethazine (PHENERGAN) 25 MG tablet Take 1 tablet (25 mg total) by mouth every 8 (eight) hours as needed for nausea or vomiting. 04/28/14  Yes Midge Minium, MD  rizatriptan (MAXALT) 10 MG tablet Take 1 tablet (10 mg total) by mouth as needed for migraine. May repeat in 2 hours if needed 05/22/14  Yes Midge Minium, MD  topiramate (TOPAMAX) 25 MG tablet 25mg  QHS x 1 week, then 25mg  BID x1 week, then 25mg  qAM and 50mg  QHS x1 week, and then 50mg  BID Patient taking differently: Take 50 mg by mouth 2 (two) times daily. Pt takes two (2) 25mg  tablets in the morning and two (2) 25mg  tablets in the evening. 05/22/14  Yes Midge Minium, MD  vitamin C (ASCORBIC ACID) 500 MG tablet Take 500 mg by mouth daily.    Yes Historical Provider, MD  glucose blood (ONE TOUCH ULTRA TEST) test strip 1 each by Other route 2 (two) times daily. And lancets 2/day. 03/03/14   Renato Shin, MD  RELION INSULIN SYR 0.5CC/30G 30G X 5/16" 0.5 ML MISC  06/12/14   Historical Provider, MD   BP 112/69 mmHg  Pulse 80  Temp(Src) 98.6 F (37 C) (Oral)  Resp 20  SpO2 100%  LMP 07/12/2014 Physical Exam  Constitutional: She is oriented to person, place, and time. She appears well-developed and well-nourished. No distress.  HENT:  Head: Normocephalic and atraumatic.  Nose: Nose normal.  Mouth/Throat: Oropharynx is clear and moist. No oropharyngeal exudate.  Eyes: Conjunctivae and EOM are normal. Pupils are equal, round, and reactive to light. No scleral icterus.  Neck: Normal range of motion. Neck supple. No JVD present. No tracheal deviation present. No thyromegaly present.  Cardiovascular: Normal rate, regular rhythm and normal heart sounds.  Exam reveals no gallop and no friction rub.   No murmur heard. Pulmonary/Chest: Effort normal and breath sounds normal. No respiratory distress. She has no wheezes. She exhibits no tenderness.  Abdominal:  Soft. Bowel sounds are normal. She exhibits no distension and no mass. There is tenderness. There is no rebound and no guarding.  Suprapubic tenderness to palpatition  Genitourinary: Vagina normal. No vaginal discharge found.  No CMT or adnexal tenderness.  Musculoskeletal: Normal range of motion. She exhibits no edema or tenderness.  Lymphadenopathy:    She has no cervical adenopathy.  Neurological: She is alert and oriented to person, place, and time. No cranial nerve deficit. She exhibits normal muscle tone.  Skin: Skin is warm and dry. No rash noted. No erythema. No pallor.  Nursing note and vitals reviewed.   ED Course  Procedures (including critical care time) Labs Review  Labs Reviewed  WET PREP, GENITAL - Abnormal; Notable for the following:    WBC, Wet Prep HPF POC MODERATE (*)    All other components within normal limits  COMPREHENSIVE METABOLIC PANEL - Abnormal; Notable for the following:    Glucose, Bld 197 (*)    Calcium 8.5 (*)    All other components within normal limits  CBC WITH DIFFERENTIAL/PLATELET - Abnormal; Notable for the following:    Hemoglobin 11.8 (*)    All other components within normal limits  LIPASE, BLOOD - Abnormal; Notable for the following:    Lipase <10 (*)    All other components within normal limits  URINALYSIS, ROUTINE W REFLEX MICROSCOPIC - Abnormal; Notable for the following:    Glucose, UA 100 (*)    All other components within normal limits  GC/CHLAMYDIA PROBE AMP ([place])    Imaging Review No results found.   EKG Interpretation None      MDM   Final diagnoses:  None    Patient presents emergency department for abdominal pain. She thinks this is a flare of her fibroids. Urinalysis does not reveal any infection, laboratory studies are unremarkable. Pelvic exam also unremarkable. Patient was given Norco and Motrin for pain relief.  Patient's pain has improved after medication. Will advise to continue Motrin at home as  needed. OB/GYN follow-up was provided for possible fibroid pain. Patient appears comfortable in no acute distress. Her vital signs were within her normal limits and she is safe for discharge.    Everlene Balls, MD 08/09/14 [phone]

## 2014-08-09 NOTE — Discharge Instructions (Signed)
Pelvic Pain Ms. Neeb, take Motrin, 800 mg up to 3 times per day as needed for pain. See your primary care physician or OB/GYN physician for continued management within 3 days. If any symptoms worsen come back to emergency department immediately. Thank you. Pelvic pain is pain felt below the belly button and between your hips. It can be caused by many different things. It is important to get help right away. This is especially true for severe, sharp, or unusual pain that comes on suddenly.  HOME CARE  Only take medicine as told by your doctor.  Rest as told by your doctor.  Eat a healthy diet, such as fruits, vegetables, and lean meats.  Drink enough fluids to keep your pee (urine) clear or pale yellow, or as told.  Avoid sex (intercourse) if it causes pain.  Apply warm or cold packs to your lower belly (abdomen). Use the type of pack that helps the pain.  Avoid situations that cause you stress.  Keep a journal to track your pain. Write down:  When the pain started.  Where it is located.  If there are things that seem to be related to the pain, such as food or your period.  Follow up with your doctor as told. GET HELP RIGHT AWAY IF:   You have heavy bleeding from the vagina.  You have more pelvic pain.  You feel lightheaded or pass out (faint).  You have chills.  You have pain when you pee or have blood in your pee.  You cannot stop having watery poop (diarrhea).  You cannot stop throwing up (vomiting).  You have a fever or lasting symptoms for more than 3 days.  You have a fever and your symptoms suddenly get worse.  You are being physically or sexually abused.  Your medicine does not help your pain.  You have fluid (discharge) coming from your vagina that is not normal. MAKE SURE YOU:  Understand these instructions.  Will watch your condition.  Will get help if you are not doing well or get worse. Document Released: 08/24/2007 Document Revised:  09/06/2011 Document Reviewed: 06/27/2011 [facility] Patient Information 2015 [place], [place]. This information is not intended to replace advice given to you by your health care provider. Make sure you discuss any questions you have with your health care provider.

## 2014-08-09 NOTE — ED Notes (Signed)
Pt reports lower abd and pelvic pain, lower back pain since Thursday. Was seen at [facility] last night for same. Denies urinary or vaginal symptoms.

## 2014-08-09 NOTE — ED Notes (Signed)
Pt laughing hysterically in triage room.

## 2014-08-11 LAB — GC/CHLAMYDIA PROBE AMP (~~LOC~~) NOT AT ARMC
Chlamydia: NEGATIVE
NEISSERIA GONORRHEA: NEGATIVE

## 2014-08-12 ENCOUNTER — Ambulatory Visit (INDEPENDENT_AMBULATORY_CARE_PROVIDER_SITE_OTHER): Payer: BLUE CROSS/BLUE SHIELD | Admitting: Endocrinology

## 2014-08-12 VITALS — BP 106/72 | HR 66 | Temp 98.2°F | Wt 196.0 lb

## 2014-08-12 DIAGNOSIS — IMO0002 Reserved for concepts with insufficient information to code with codable children: Secondary | ICD-10-CM

## 2014-08-12 DIAGNOSIS — E1065 Type 1 diabetes mellitus with hyperglycemia: Secondary | ICD-10-CM | POA: Diagnosis not present

## 2014-08-12 LAB — HEMOGLOBIN A1C: Hgb A1c MFr Bld: 6.4 % (ref 4.6–6.5)

## 2014-08-12 NOTE — Patient Instructions (Addendum)
check your blood sugar twice a day.  vary the time of day when you check, between before the 3 meals, and at bedtime.  also check if you have symptoms of your blood sugar being too high or too low.  please keep a record of the readings and bring it to your next appointment here.  You can write it on any piece of paper.  please call us sooner if your blood sugar goes below 70, or if you have a lot of readings over 200.   In view of your medical condition, you should avoid pregnancy until we have decided it is safe.    blood tests are being requested for you today.  We'll let you know about the results.  On this type of insulin schedule, you should eat meals on a regular schedule.  If a meal is missed or significantly delayed, your blood sugar could go low. Please come back for a follow-up appointment in 3 months.

## 2014-08-12 NOTE — Progress Notes (Signed)
Subjective:    Patient ID: Kristina Neal, female    DOB: 09/23/1975, 39 y.o.   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   Dx'ed: 9242 Complications: none Therapy: insulin since 2011, when she had DKA.   GDM: never.  DKA: as above, and again in 2014. Severe hypoglycemia: never. Pancreatitis: never.   Other: due to noncompliance, she is on a simple QD insulin schedule.  She works 3rd shift, in [employer], educational.   Interval history: She hasn't recently checked cbg, due to her father's death.  She says lower a1c at last ov was due to activity, but she has been less active the past month or so.   Past Medical History  Diagnosis Date  . Diabetes mellitus without complication   . Asthma   . GERD (gastroesophageal reflux disease)   . Anemia     Past Surgical History  Procedure Laterality Date  . Cervical biopsy  w/ loop electrode excision    . Whipple procedure  2008  . Cervical tumor removed    . Dilation and curettage of uterus  2012  . Leep    . Robot assisted myomectomy N/A 08/23/2013    Procedure: ROBOTIC ASSISTED MYOMECTOMY;  Surgeon: Lahoma Crocker, MD;  Location: [facility];  Service: Gynecology;  Laterality: N/A;    History   Social History  . Marital Status: Legally Separated    Spouse Name: N/A  . Number of Children: 0  . Years of Education: N/A   Occupational History  . CSR    Social History Main Topics  . Smoking status: Former Smoker    Types: Cigarettes    Quit date: 03/21/2006  . Smokeless tobacco: Never Used  . Alcohol Use: 0.0 oz/week    0 Standard drinks or equivalent per week     Comment: occasionally  . Drug Use: No  . Sexual Activity:    Partners: Male    Birth Control/ Protection: None   Other Topics Concern  . Not on file   Social History Narrative    Current Outpatient Prescriptions on File Prior to Visit  Medication Sig  Dispense Refill  . albuterol (PROVENTIL HFA;VENTOLIN HFA) 108 (90 BASE) MCG/ACT inhaler Inhale 1-2 puffs into the lungs every 6 (six) hours as needed for wheezing or shortness of breath. 1 Inhaler 0  . citalopram (CELEXA) 40 MG tablet Take 1 tablet (40 mg total) by mouth daily. 30 tablet 3  . clonazePAM (KLONOPIN) 0.5 MG tablet Take 1 tablet (0.5 mg total) by mouth 2 (two) times daily as needed for anxiety. 20 tablet 0  . CREON 36000 UNITS CPEP capsule TAKE TWO CAPSULES BY MOUTH THREE TIMES DAILY BEFORE MEAL(S) 180 capsule 1  . ferrous sulfate 325 (65 FE) MG tablet Take 325 mg by mouth daily with breakfast.    . folic acid (FOLVITE) 1 MG tablet Take 1 mg by mouth daily.    Marland Kitchen glucose blood (ONE TOUCH ULTRA TEST) test strip 1 each by Other route 2 (two) times daily. And lancets 2/day. 100 each 12  . HYDROcodone-acetaminophen (NORCO/VICODIN) 5-325 MG per tablet Take 1 tablet by mouth 2 (two) times daily as needed for severe pain. 6 tablet 0  . insulin detemir (LEVEMIR) 100 UNIT/ML injection Inject 0.28 mLs (28 Units total) into the skin daily. And syringes 1/day (Patient taking differently: Inject 25 Units into the skin every  morning. ) 10 mL 11  . Mefenamic Acid 250 MG CAPS Take 250 mg by mouth as directed. Patient takes when needed during menstrual cycle    . Melatonin 3 MG CAPS Take 3 mg by mouth at bedtime.     . Prenatal Vit-Fe Fumarate-FA (PRENATAL MULTIVITAMIN) TABS tablet Take 1 tablet by mouth daily at 12 noon.    . promethazine (PHENERGAN) 25 MG tablet Take 1 tablet (25 mg total) by mouth every 8 (eight) hours as needed for nausea or vomiting. 20 tablet 0  . RELION INSULIN SYR 0.5CC/30G 30G X 5/16" 0.5 ML MISC     . rizatriptan (MAXALT) 10 MG tablet Take 1 tablet (10 mg total) by mouth as needed for migraine. May repeat in 2 hours if needed 10 tablet 3  . topiramate (TOPAMAX) 25 MG tablet 25mg  QHS x 1 week, then 25mg  BID x1 week, then 25mg  qAM and 50mg  QHS x1 week, and then 50mg  BID (Patient  taking differently: Take 50 mg by mouth 2 (two) times daily. Pt takes two (2) 25mg  tablets in the morning and two (2) 25mg  tablets in the evening.) 120 tablet 3  . vitamin C (ASCORBIC ACID) 500 MG tablet Take 500 mg by mouth daily.      No current facility-administered medications on file prior to visit.    Allergies  Allergen Reactions  . Sulfa Antibiotics Other (See Comments)    Causes headache.  . Amoxicillin Hives and Rash    Family History  Problem Relation Age of Onset  . Stroke Mother   . Hypertension Mother   . Diabetes Father   . Hypertension Maternal Grandmother   . Dementia Maternal Grandmother   . Cancer Maternal Grandfather     bone  . Diabetes Paternal Grandmother   . Hypertension Paternal Grandmother   . Prostate cancer Paternal Grandfather   . Colon cancer Neg Hx   . Breast cancer Paternal Aunt   . Heart disease Maternal Grandmother   . Heart disease Paternal Grandmother   . Colon polyps Neg Hx   . Esophageal cancer Neg Hx     BP 106/72 mmHg  Pulse 66  Temp(Src) 98.2 F (36.8 C) (Oral)  Wt 196 lb (88.905 kg)  SpO2 97%  LMP 07/12/2014    Review of Systems She denies hypoglycemia.      Objective:   Physical Exam VITAL SIGNS:  See vs page GENERAL: no distress Pulses: dorsalis pedis intact bilat.   MSK: no deformity of the feet CV: no leg edema Skin:  no ulcer on the feet.  normal color and temp on the feet. Neuro: sensation is intact to touch on the feet   Lab Results  Component Value Date   HGBA1C 6.4 08/12/2014       Assessment & Plan:  DM: overcontrolled, given this regimen, which does match insulin to her changing needs throughout the day  Patient is advised the following: Patient Instructions  check your blood sugar twice a day.  vary the time of day when you check, between before the 3 meals, and at bedtime.  also check if you have symptoms of your blood sugar being too high or too low.  please keep a record of the readings and  bring it to your next appointment here.  You can write it on any piece of paper.  please call us sooner if your blood sugar goes below 70, or if you have a lot of readings over 200.   In view  of your medical condition, you should avoid pregnancy until we have decided it is safe.    blood tests are being requested for you today.  We'll let you know about the results.  On this type of insulin schedule, you should eat meals on a regular schedule.  If a meal is missed or significantly delayed, your blood sugar could go low. Please come back for a follow-up appointment in 3 months.     addendum: decrease levemir to 25 units qam

## 2014-10-15 ENCOUNTER — Other Ambulatory Visit: Payer: Self-pay | Admitting: Family Medicine

## 2014-10-25 ENCOUNTER — Emergency Department (HOSPITAL_COMMUNITY)
Admission: EM | Admit: 2014-10-25 | Discharge: 2014-10-25 | Disposition: A | Discharge status: Home / Self Care | Payer: BLUE CROSS/BLUE SHIELD | Attending: Emergency Medicine | Admitting: Emergency Medicine

## 2014-10-25 ENCOUNTER — Encounter (HOSPITAL_COMMUNITY): Payer: Self-pay | Admitting: Emergency Medicine

## 2014-10-25 DIAGNOSIS — S61213A Laceration without foreign body of left middle finger without damage to nail, initial encounter: Secondary | ICD-10-CM | POA: Insufficient documentation

## 2014-10-25 DIAGNOSIS — Z79899 Other long term (current) drug therapy: Secondary | ICD-10-CM | POA: Insufficient documentation

## 2014-10-25 DIAGNOSIS — Z23 Encounter for immunization: Secondary | ICD-10-CM | POA: Insufficient documentation

## 2014-10-25 DIAGNOSIS — Z794 Long term (current) use of insulin: Secondary | ICD-10-CM | POA: Insufficient documentation

## 2014-10-25 DIAGNOSIS — W25XXXA Contact with sharp glass, initial encounter: Secondary | ICD-10-CM | POA: Insufficient documentation

## 2014-10-25 DIAGNOSIS — Z880 Allergy status to penicillin: Secondary | ICD-10-CM | POA: Insufficient documentation

## 2014-10-25 DIAGNOSIS — Y929 Unspecified place or not applicable: Secondary | ICD-10-CM | POA: Insufficient documentation

## 2014-10-25 DIAGNOSIS — E119 Type 2 diabetes mellitus without complications: Secondary | ICD-10-CM | POA: Insufficient documentation

## 2014-10-25 DIAGNOSIS — Z8719 Personal history of other diseases of the digestive system: Secondary | ICD-10-CM | POA: Insufficient documentation

## 2014-10-25 DIAGNOSIS — S61412A Laceration without foreign body of left hand, initial encounter: Secondary | ICD-10-CM

## 2014-10-25 DIAGNOSIS — S61215A Laceration without foreign body of left ring finger without damage to nail, initial encounter: Secondary | ICD-10-CM | POA: Insufficient documentation

## 2014-10-25 DIAGNOSIS — J45909 Unspecified asthma, uncomplicated: Secondary | ICD-10-CM | POA: Insufficient documentation

## 2014-10-25 DIAGNOSIS — Y998 Other external cause status: Secondary | ICD-10-CM | POA: Insufficient documentation

## 2014-10-25 DIAGNOSIS — D649 Anemia, unspecified: Secondary | ICD-10-CM | POA: Insufficient documentation

## 2014-10-25 DIAGNOSIS — Z87891 Personal history of nicotine dependence: Secondary | ICD-10-CM | POA: Insufficient documentation

## 2014-10-25 DIAGNOSIS — Y9389 Activity, other specified: Secondary | ICD-10-CM | POA: Insufficient documentation

## 2014-10-25 MED ORDER — TETANUS-DIPHTH-ACELL PERTUSSIS 5-2.5-18.5 LF-MCG/0.5 IM SUSP
0.5000 mL | Freq: Once | INTRAMUSCULAR | Status: AC
  Administered 2014-10-25: 0.5 mL via INTRAMUSCULAR
  Filled 2014-10-25: qty 0.5

## 2014-10-25 NOTE — ED Notes (Signed)
Pt A+Ox4, reports cut L ring finger on glass while doing this dishes this AM.  Pt sts tetanus not up to date.  Pt denies other complaints.  Very minor laceration noted to L 3rd and 4th fingers.  No bleeding.  Edges approximated.  Skin otherwise PWD.  MAEI.  Speaking full/clear sentences, rr even/un-lab.  NAD.

## 2014-10-25 NOTE — ED Provider Notes (Signed)
CSN: [id number]     Arrival date & time 10/25/14  1006 History   First MD Initiated Contact with Patient 10/25/14 1008     Chief Complaint  Patient presents with  . Finger Injury    L ring finger, cut on glass while doing dishes     (Consider location/radiation/quality/duration/timing/severity/associated sxs/prior Treatment) The history is provided by the patient and medical records.    This is a 39 year old female with history of diabetes, asthma, GERD, anemia presenting to the ED for left hand laceration. Patient sustained superficial lacerations to her left middle and ring finger this morning while doing dishes. Bleeding minimal at this time. No numbness or weakness of affected hand. Date of last tetanus approximately 20 years ago.  Past Medical History  Diagnosis Date  . Diabetes mellitus without complication   . Asthma   . GERD (gastroesophageal reflux disease)   . Anemia    Past Surgical History  Procedure Laterality Date  . Cervical biopsy  w/ loop electrode excision    . Whipple procedure  2008  . Cervical tumor removed    . Dilation and curettage of uterus  2012  . Leep    . Robot assisted myomectomy N/A 08/23/2013    Procedure: ROBOTIC ASSISTED MYOMECTOMY;  Surgeon: Lahoma Crocker, MD;  Location: [facility];  Service: Gynecology;  Laterality: N/A;   Family History  Problem Relation Age of Onset  . Stroke Mother   . Hypertension Mother   . Diabetes Father   . Hypertension Maternal Grandmother   . Dementia Maternal Grandmother   . Cancer Maternal Grandfather     bone  . Diabetes Paternal Grandmother   . Hypertension Paternal Grandmother   . Prostate cancer Paternal Grandfather   . Colon cancer Neg Hx   . Breast cancer Paternal Aunt   . Heart disease Maternal Grandmother   . Heart disease Paternal Grandmother   . Colon polyps Neg Hx   . Esophageal cancer Neg Hx    History  Substance Use Topics  . Smoking status: Former Smoker    Types: Cigarettes    Quit  date: 03/21/2006  . Smokeless tobacco: Never Used  . Alcohol Use: 0.0 oz/week    0 Standard drinks or equivalent per week     Comment: occasionally   OB History    Gravida Para Term Preterm AB TAB SAB Ectopic Multiple Living   4    4  4    0     Review of Systems  Skin: Positive for wound.  All other systems reviewed and are negative.     Allergies  Sulfa antibiotics and Amoxicillin  Home Medications   Prior to Admission medications   Medication Sig Start Date End Date Taking? Authorizing Provider  albuterol (PROVENTIL HFA;VENTOLIN HFA) 108 (90 BASE) MCG/ACT inhaler Inhale 1-2 puffs into the lungs every 6 (six) hours as needed for wheezing or shortness of breath. 02/05/14   Elnora Morrison, MD  citalopram (CELEXA) 40 MG tablet Take 1 tablet (40 mg total) by mouth daily. 10/15/14   Midge Minium, MD  clonazePAM (KLONOPIN) 0.5 MG tablet Take 1 tablet (0.5 mg total) by mouth 2 (two) times daily as needed for anxiety. 07/09/14   Midge Minium, MD  CREON 36000 UNITS CPEP capsule TAKE TWO CAPSULES BY MOUTH THREE TIMES DAILY BEFORE MEAL(S) 07/08/14   Lafayette Dragon, MD  ferrous sulfate 325 (65 FE) MG tablet Take 325 mg by mouth daily with breakfast.    Historical  Provider, MD  folic acid (FOLVITE) 1 MG tablet Take 1 mg by mouth daily.    Historical Provider, MD  glucose blood (ONE TOUCH ULTRA TEST) test strip 1 each by Other route 2 (two) times daily. And lancets 2/day. 03/03/14   Renato Shin, MD  HYDROcodone-acetaminophen (NORCO/VICODIN) 5-325 MG per tablet Take 1 tablet by mouth 2 (two) times daily as needed for severe pain. 08/09/14   Everlene Balls, MD  insulin detemir (LEVEMIR) 100 UNIT/ML injection Inject 0.28 mLs (28 Units total) into the skin daily. And syringes 1/day Patient taking differently: Inject 25 Units into the skin every morning.  06/12/14   Renato Shin, MD  Mefenamic Acid 250 MG CAPS Take 250 mg by mouth as directed. Patient takes when needed during menstrual cycle  07/25/13   Historical Provider, MD  Melatonin 3 MG CAPS Take 3 mg by mouth at bedtime.     Historical Provider, MD  Prenatal Vit-Fe Fumarate-FA (PRENATAL MULTIVITAMIN) TABS tablet Take 1 tablet by mouth daily at 12 noon.    Historical Provider, MD  promethazine (PHENERGAN) 25 MG tablet Take 1 tablet (25 mg total) by mouth every 8 (eight) hours as needed for nausea or vomiting. 04/28/14   Midge Minium, MD  RELION INSULIN SYR 0.5CC/30G 30G X 5/16" 0.5 ML MISC  06/12/14   Historical Provider, MD  rizatriptan (MAXALT) 10 MG tablet Take 1 tablet (10 mg total) by mouth as needed for migraine. May repeat in 2 hours if needed 05/22/14   Midge Minium, MD  topiramate (TOPAMAX) 25 MG tablet 25mg  QHS x 1 week, then 25mg  BID x1 week, then 25mg  qAM and 50mg  QHS x1 week, and then 50mg  BID Patient taking differently: Take 50 mg by mouth 2 (two) times daily. Pt takes two (2) 25mg  tablets in the morning and two (2) 25mg  tablets in the evening. 05/22/14   Midge Minium, MD  vitamin C (ASCORBIC ACID) 500 MG tablet Take 500 mg by mouth daily.     Historical Provider, MD   BP 136/75 mmHg  Pulse 87  Temp(Src) 98.2 F (36.8 C) (Oral)  Resp 18  Ht 5\' 10"  (1.778 m)  Wt 190 lb (86.183 kg)  BMI 27.26 kg/m2  SpO2 100%  LMP 10/13/2014   Physical Exam  Constitutional: She is oriented to person, place, and time. She appears well-developed and well-nourished.  HENT:  Head: Normocephalic and atraumatic.  Mouth/Throat: Oropharynx is clear and moist.  Eyes: Conjunctivae and EOM are normal. Pupils are equal, round, and reactive to light.  Neck: Normal range of motion.  Cardiovascular: Normal rate, regular rhythm and normal heart sounds.   Pulmonary/Chest: Effort normal and breath sounds normal. No respiratory distress.  Abdominal: Soft. Bowel sounds are normal.  Musculoskeletal: Normal range of motion.  Superficial 0.25cm lacerations to dorsal aspect of left 3rd and 4th digits; no bleeding noted; full  flexion/extension of fingers maintained; strong radial pulse and cap refill; normal sensation throughout left hand  Neurological: She is alert and oriented to person, place, and time.  Skin: Skin is warm and dry.  Psychiatric: She has a normal mood and affect.  Nursing note and vitals reviewed.   ED Course  Procedures (including critical care time)  LACERATION REPAIR Performed by: Larene Pickett Authorized by: Larene Pickett Consent: Verbal consent obtained. Risks and benefits: risks, benefits and alternatives were discussed Consent given by: patient Patient identity confirmed: provided demographic data Prepped and Draped in normal sterile fashion Wound explored  Laceration Location: left 3rd and 4th digits, superficial  Laceration Length: 0.25 cm x2  No Foreign Bodies seen or palpated  Anesthesia: none  Local anesthetic: none  Anesthetic total: 0 ml  Irrigation method: syringe Amount of cleaning: standard  Skin closure: dermabond  Number of sutures: 0  Technique: n/a  Patient tolerance: Patient tolerated the procedure well with no immediate complications.  Labs Review Labs Reviewed - No data to display  Imaging Review No results found.   EKG Interpretation None      MDM   Final diagnoses:  Laceration of left hand, initial encounter   39 y.o. F with superficial left hand lacerations from broken dishes.  Lacerations are extremely superficial, approx 0.25cm each.  No active bleeding.  Full flexion/extension of affected fingers maintained.  Hand is NVI.  Lacerations repaired with dermabond.  Tetanus updated.  FU with PCP if any issues.  Discussed plan with patient, he/she acknowledged understanding and agreed with plan of care.  Return precautions given for new or worsening symptoms.  Larene Pickett, PA-C 10/25/14 [place], DO 10/25/14 1534

## 2014-10-25 NOTE — ED Notes (Signed)
dermabond given to provider

## 2014-10-25 NOTE — Discharge Instructions (Signed)
dermabond will slough off over the next few days. May shower and wash hands as normal. Follow-up with your primary care physician if any issues. Return here for new concerns.

## 2014-11-12 ENCOUNTER — Ambulatory Visit: Payer: BLUE CROSS/BLUE SHIELD | Admitting: Endocrinology

## 2014-12-23 ENCOUNTER — Emergency Department (HOSPITAL_COMMUNITY)
Admission: EM | Admit: 2014-12-23 | Discharge: 2014-12-23 | Disposition: A | Discharge status: Home / Self Care | Payer: BLUE CROSS/BLUE SHIELD | Attending: Emergency Medicine | Admitting: Emergency Medicine

## 2014-12-23 ENCOUNTER — Encounter (HOSPITAL_COMMUNITY): Payer: Self-pay | Admitting: Emergency Medicine

## 2014-12-23 DIAGNOSIS — D649 Anemia, unspecified: Secondary | ICD-10-CM | POA: Insufficient documentation

## 2014-12-23 DIAGNOSIS — J45909 Unspecified asthma, uncomplicated: Secondary | ICD-10-CM | POA: Insufficient documentation

## 2014-12-23 DIAGNOSIS — Z79899 Other long term (current) drug therapy: Secondary | ICD-10-CM | POA: Insufficient documentation

## 2014-12-23 DIAGNOSIS — Z3202 Encounter for pregnancy test, result negative: Secondary | ICD-10-CM | POA: Insufficient documentation

## 2014-12-23 DIAGNOSIS — R110 Nausea: Secondary | ICD-10-CM | POA: Insufficient documentation

## 2014-12-23 DIAGNOSIS — R739 Hyperglycemia, unspecified: Secondary | ICD-10-CM

## 2014-12-23 DIAGNOSIS — E1165 Type 2 diabetes mellitus with hyperglycemia: Secondary | ICD-10-CM | POA: Insufficient documentation

## 2014-12-23 DIAGNOSIS — Z87891 Personal history of nicotine dependence: Secondary | ICD-10-CM | POA: Insufficient documentation

## 2014-12-23 DIAGNOSIS — Z8719 Personal history of other diseases of the digestive system: Secondary | ICD-10-CM | POA: Insufficient documentation

## 2014-12-23 DIAGNOSIS — Z794 Long term (current) use of insulin: Secondary | ICD-10-CM | POA: Insufficient documentation

## 2014-12-23 LAB — BASIC METABOLIC PANEL
ANION GAP: 6 (ref 5–15)
BUN: <5 mg/dL — ABNORMAL LOW (ref 6–20)
CHLORIDE: 101 mmol/L (ref 101–111)
CO2: 28 mmol/L (ref 22–32)
Calcium: 8.7 mg/dL — ABNORMAL LOW (ref 8.9–10.3)
Creatinine, Ser: 0.55 mg/dL (ref 0.44–1.00)
GFR calc non Af Amer: >60 mL/min (ref >60)
GLUCOSE: 300 mg/dL — AB (ref 65–99)
POTASSIUM: 4 mmol/L (ref 3.5–5.1)
Sodium: 135 mmol/L (ref 135–145)

## 2014-12-23 LAB — URINALYSIS, ROUTINE W REFLEX MICROSCOPIC
BILIRUBIN URINE: NEGATIVE
Glucose, UA: >1000 mg/dL — AB
Hgb urine dipstick: NEGATIVE
Ketones, ur: NEGATIVE mg/dL
LEUKOCYTES UA: NEGATIVE
Nitrite: NEGATIVE
PH: 5.5 (ref 5.0–8.0)
Protein, ur: NEGATIVE mg/dL
SPECIFIC GRAVITY, URINE: 1.029 (ref 1.005–1.030)
UROBILINOGEN UA: 0.2 mg/dL (ref 0.0–1.0)

## 2014-12-23 LAB — URINE MICROSCOPIC-ADD ON

## 2014-12-23 LAB — CBG MONITORING, ED
GLUCOSE-CAPILLARY: 292 mg/dL — AB (ref 65–99)
GLUCOSE-CAPILLARY: 233 mg/dL — AB (ref 65–99)

## 2014-12-23 LAB — PREGNANCY, URINE: Preg Test, Ur: NEGATIVE

## 2014-12-23 MED ORDER — SODIUM CHLORIDE 0.9 % IV BOLUS (SEPSIS)
1000.0000 mL | Freq: Once | INTRAVENOUS | Status: AC
  Administered 2014-12-23: 1000 mL via INTRAVENOUS

## 2014-12-23 NOTE — ED Notes (Signed)
Pt states she felt tired all day yesterday  Today she states she has felt weak, dizzy, light headed, nauseated, and thirsty  Pt states over the weekend she had cramping in her legs and feet  Pt states she checked her blood sugar and it was 312 and again later it was 336  Pt states she is very thirsty

## 2014-12-23 NOTE — Discharge Instructions (Signed)

## 2014-12-23 NOTE — ED Provider Notes (Signed)
CSN: [id number]     Arrival date & time 12/23/14  0403 History   First MD Initiated Contact with Patient 12/23/14 [phone]     Chief Complaint  Patient presents with  . Hyperglycemia     (Consider location/radiation/quality/duration/timing/severity/associated sxs/prior Treatment) Patient is a 39 y.o. female presenting with hyperglycemia. The history is provided by the patient. No language interpreter was used.  Hyperglycemia Blood sugar level PTA:  312 Severity:  Moderate Onset quality:  Gradual Diabetes status:  Controlled with insulin Associated symptoms: nausea   Associated symptoms: no dysuria, no fever, no increased thirst and no weakness   Associated symptoms comment:  Type 1 DM on a.m. 25 U regular insulin presents with elevated blood sugar at home and symptoms of nausea without vomiting. She denies fever, dysuria, cough, congestion or pain. She reports that she is usually well controlled on current regimen and the last episode of uncontrolled blood sugar was about 2 years ago.    Past Medical History  Diagnosis Date  . Diabetes mellitus without complication ([place])   . Asthma   . GERD (gastroesophageal reflux disease)   . Anemia    Past Surgical History  Procedure Laterality Date  . Cervical biopsy  w/ loop electrode excision    . Whipple procedure  2008  . Cervical tumor removed    . Dilation and curettage of uterus  2012  . Leep    . Robot assisted myomectomy N/A 08/23/2013    Procedure: ROBOTIC ASSISTED MYOMECTOMY;  Surgeon: Lahoma Crocker, MD;  Location: [facility];  Service: Gynecology;  Laterality: N/A;   Family History  Problem Relation Age of Onset  . Stroke Mother   . Hypertension Mother   . Diabetes Father   . Hypertension Maternal Grandmother   . Dementia Maternal Grandmother   . Cancer Maternal Grandfather     bone  . Diabetes Paternal Grandmother   . Hypertension Paternal Grandmother   . Prostate cancer Paternal Grandfather   . Colon cancer Neg Hx   .  Breast cancer Paternal Aunt   . Heart disease Maternal Grandmother   . Heart disease Paternal Grandmother   . Colon polyps Neg Hx   . Esophageal cancer Neg Hx    Social History  Substance Use Topics  . Smoking status: Former Smoker    Types: Cigarettes    Quit date: 03/21/2006  . Smokeless tobacco: Never Used  . Alcohol Use: No   OB History    Gravida Para Term Preterm AB TAB SAB Ectopic Multiple Living   4    4  4    0     Review of Systems  Constitutional: Negative for fever and chills.       "I don't feel right."  HENT: Negative.   Respiratory: Negative.   Cardiovascular: Negative.   Gastrointestinal: Positive for nausea.  Endocrine: Negative for polydipsia.  Genitourinary: Positive for frequency. Negative for dysuria.  Musculoskeletal: Negative.   Skin: Negative.   Neurological: Negative.  Negative for syncope and weakness.      Allergies  Sulfa antibiotics and Amoxicillin  Home Medications   Prior to Admission medications   Medication Sig Start Date End Date Taking? Authorizing Provider  albuterol (PROVENTIL HFA;VENTOLIN HFA) 108 (90 BASE) MCG/ACT inhaler Inhale 1-2 puffs into the lungs every 6 (six) hours as needed for wheezing or shortness of breath. 02/05/14   Elnora Morrison, MD  citalopram (CELEXA) 40 MG tablet Take 1 tablet (40 mg total) by mouth daily. 10/15/14  Midge Minium, MD  clonazePAM (KLONOPIN) 0.5 MG tablet Take 1 tablet (0.5 mg total) by mouth 2 (two) times daily as needed for anxiety. 07/09/14   Midge Minium, MD  CREON 36000 UNITS CPEP capsule TAKE TWO CAPSULES BY MOUTH THREE TIMES DAILY BEFORE MEAL(S) 07/08/14   Lafayette Dragon, MD  ferrous sulfate 325 (65 FE) MG tablet Take 325 mg by mouth daily with breakfast.    Historical Provider, MD  folic acid (FOLVITE) 1 MG tablet Take 1 mg by mouth daily.    Historical Provider, MD  glucose blood (ONE TOUCH ULTRA TEST) test strip 1 each by Other route 2 (two) times daily. And lancets 2/day. 03/03/14    Renato Shin, MD  HYDROcodone-acetaminophen (NORCO/VICODIN) 5-325 MG per tablet Take 1 tablet by mouth 2 (two) times daily as needed for severe pain. 08/09/14   Everlene Balls, MD  insulin detemir (LEVEMIR) 100 UNIT/ML injection Inject 0.28 mLs (28 Units total) into the skin daily. And syringes 1/day Patient taking differently: Inject 25 Units into the skin every morning.  06/12/14   Renato Shin, MD  Mefenamic Acid 250 MG CAPS Take 250 mg by mouth as directed. Patient takes when needed during menstrual cycle 07/25/13   Historical Provider, MD  Melatonin 3 MG CAPS Take 3 mg by mouth at bedtime.     Historical Provider, MD  Prenatal Vit-Fe Fumarate-FA (PRENATAL MULTIVITAMIN) TABS tablet Take 1 tablet by mouth daily at 12 noon.    Historical Provider, MD  promethazine (PHENERGAN) 25 MG tablet Take 1 tablet (25 mg total) by mouth every 8 (eight) hours as needed for nausea or vomiting. 04/28/14   Midge Minium, MD  RELION INSULIN SYR 0.5CC/30G 30G X 5/16" 0.5 ML MISC  06/12/14   Historical Provider, MD  rizatriptan (MAXALT) 10 MG tablet Take 1 tablet (10 mg total) by mouth as needed for migraine. May repeat in 2 hours if needed 05/22/14   Midge Minium, MD  topiramate (TOPAMAX) 25 MG tablet 25mg  QHS x 1 week, then 25mg  BID x1 week, then 25mg  qAM and 50mg  QHS x1 week, and then 50mg  BID Patient taking differently: Take 50 mg by mouth 2 (two) times daily. Pt takes two (2) 25mg  tablets in the morning and two (2) 25mg  tablets in the evening. 05/22/14   Midge Minium, MD  vitamin C (ASCORBIC ACID) 500 MG tablet Take 500 mg by mouth daily.     Historical Provider, MD   BP 112/59 mmHg  Pulse 93  Temp(Src) 98.1 F (36.7 C) (Oral)  Resp 16  SpO2 100%  LMP 12/08/2014 (Exact Date) Physical Exam  Constitutional: She is oriented to person, place, and time. She appears well-developed and well-nourished.  HENT:  Head: Normocephalic.  Mouth/Throat: Oropharynx is clear and moist.  Neck: Normal range of  motion. Neck supple.  Cardiovascular: Normal rate and regular rhythm.   Pulmonary/Chest: Effort normal and breath sounds normal.  Abdominal: Soft. Bowel sounds are normal. There is no tenderness. There is no rebound and no guarding.  Musculoskeletal: Normal range of motion.  Neurological: She is alert and oriented to person, place, and time.  Skin: Skin is warm and dry. No rash noted.  Psychiatric: She has a normal mood and affect.    ED Course  Procedures (including critical care time) Labs Review Labs Reviewed  CBG MONITORING, ED - Abnormal; Notable for the following:    Glucose-Capillary 292 (*)    All other components within normal limits  BASIC METABOLIC PANEL  URINALYSIS, ROUTINE W REFLEX MICROSCOPIC (NOT AT [facility])  PREGNANCY, URINE    Imaging Review No results found. I have personally reviewed and evaluated these images and lab results as part of my medical decision-making.   EKG Interpretation None      MDM   Final diagnoses:  None    1. Hyperglycemia  No evidence DKA. The patient is given IV fluids, appears stable with normal vital signs - no tachycardia. She is felt appropriate for discharge home and PCP follow up.    Charlann Lange, PA-C 12/23/14 0601  April Palumbo, MD 12/23/14 [phone]

## 2014-12-30 ENCOUNTER — Telehealth: Payer: Self-pay | Admitting: Family Medicine

## 2014-12-30 MED ORDER — CITALOPRAM HYDROBROMIDE 40 MG PO TABS: 40.0000 mg | ORAL_TABLET | Freq: Every day | ORAL | Status: DC

## 2014-12-30 NOTE — Telephone Encounter (Signed)
Medication filled to pharmacy as requested.   

## 2014-12-30 NOTE — Telephone Encounter (Signed)
Relation to [place]:GURK Call back number:[phone] Pharmacy: [facility] [place], [place]. [phone] (Phone) [phone] (Fax)         Reason for call:   Patient requesting a refill citalopram (CELEXA) 40 MG tablet  And states insurance will not be active until November, patient scheduled for 01/30/15. Patient insurance does not go into effect until November.

## 2015-01-23 ENCOUNTER — Encounter (HOSPITAL_BASED_OUTPATIENT_CLINIC_OR_DEPARTMENT_OTHER): Payer: Self-pay | Admitting: *Deleted

## 2015-01-23 ENCOUNTER — Emergency Department (HOSPITAL_BASED_OUTPATIENT_CLINIC_OR_DEPARTMENT_OTHER): Payer: BLUE CROSS/BLUE SHIELD

## 2015-01-23 ENCOUNTER — Emergency Department (HOSPITAL_BASED_OUTPATIENT_CLINIC_OR_DEPARTMENT_OTHER)
Admission: EM | Admit: 2015-01-23 | Discharge: 2015-01-23 | Disposition: A | Discharge status: Home / Self Care | Payer: Self-pay | Attending: Emergency Medicine | Admitting: Emergency Medicine

## 2015-01-23 DIAGNOSIS — Z8719 Personal history of other diseases of the digestive system: Secondary | ICD-10-CM | POA: Insufficient documentation

## 2015-01-23 DIAGNOSIS — R0789 Other chest pain: Secondary | ICD-10-CM

## 2015-01-23 DIAGNOSIS — E119 Type 2 diabetes mellitus without complications: Secondary | ICD-10-CM | POA: Insufficient documentation

## 2015-01-23 DIAGNOSIS — Z79899 Other long term (current) drug therapy: Secondary | ICD-10-CM | POA: Insufficient documentation

## 2015-01-23 DIAGNOSIS — J45909 Unspecified asthma, uncomplicated: Secondary | ICD-10-CM | POA: Insufficient documentation

## 2015-01-23 DIAGNOSIS — Z794 Long term (current) use of insulin: Secondary | ICD-10-CM | POA: Insufficient documentation

## 2015-01-23 DIAGNOSIS — Z862 Personal history of diseases of the blood and blood-forming organs and certain disorders involving the immune mechanism: Secondary | ICD-10-CM | POA: Insufficient documentation

## 2015-01-23 DIAGNOSIS — R110 Nausea: Secondary | ICD-10-CM | POA: Insufficient documentation

## 2015-01-23 DIAGNOSIS — Z880 Allergy status to penicillin: Secondary | ICD-10-CM | POA: Insufficient documentation

## 2015-01-23 DIAGNOSIS — H5789 Other specified disorders of eye and adnexa: Secondary | ICD-10-CM

## 2015-01-23 DIAGNOSIS — H169 Unspecified keratitis: Secondary | ICD-10-CM | POA: Insufficient documentation

## 2015-01-23 DIAGNOSIS — H578 Other specified disorders of eye and adnexa: Secondary | ICD-10-CM | POA: Insufficient documentation

## 2015-01-23 DIAGNOSIS — Z87891 Personal history of nicotine dependence: Secondary | ICD-10-CM | POA: Insufficient documentation

## 2015-01-23 LAB — CBC WITH DIFFERENTIAL/PLATELET
Basophils Absolute: 0 10*3/uL (ref 0.0–0.1)
Basophils Relative: 0 %
Eosinophils Absolute: 0.2 10*3/uL (ref 0.0–0.7)
Eosinophils Relative: 2 %
HEMATOCRIT: 32.5 % — AB (ref 36.0–46.0)
HEMOGLOBIN: 10.3 g/dL — AB (ref 12.0–15.0)
LYMPHS ABS: 2.3 10*3/uL (ref 0.7–4.0)
LYMPHS PCT: 32 %
MCH: 27.2 pg (ref 26.0–34.0)
MCHC: 31.7 g/dL (ref 30.0–36.0)
MCV: 85.8 fL (ref 78.0–100.0)
MONOS PCT: 8 %
Monocytes Absolute: 0.5 10*3/uL (ref 0.1–1.0)
NEUTROS ABS: 4.1 10*3/uL (ref 1.7–7.7)
NEUTROS PCT: 58 %
Platelets: 200 10*3/uL (ref 150–400)
RBC: 3.79 MIL/uL — AB (ref 3.87–5.11)
RDW: 13.4 % (ref 11.5–15.5)
WBC: 7 10*3/uL (ref 4.0–10.5)

## 2015-01-23 LAB — COMPREHENSIVE METABOLIC PANEL
ALBUMIN: 3.8 g/dL (ref 3.5–5.0)
ALK PHOS: 52 U/L (ref 38–126)
ALT: 16 U/L (ref 14–54)
AST: 19 U/L (ref 15–41)
Anion gap: 6 (ref 5–15)
BUN: 7 mg/dL (ref 6–20)
CALCIUM: 8.3 mg/dL — AB (ref 8.9–10.3)
CHLORIDE: 103 mmol/L (ref 101–111)
CO2: 26 mmol/L (ref 22–32)
CREATININE: 0.38 mg/dL — AB (ref 0.44–1.00)
GFR calc Af Amer: >60 mL/min (ref >60)
GFR calc non Af Amer: >60 mL/min (ref >60)
GLUCOSE: 168 mg/dL — AB (ref 65–99)
Potassium: 3.9 mmol/L (ref 3.5–5.1)
SODIUM: 135 mmol/L (ref 135–145)
Total Bilirubin: 0.6 mg/dL (ref 0.3–1.2)
Total Protein: 6.9 g/dL (ref 6.5–8.1)

## 2015-01-23 LAB — TROPONIN I: Troponin I: <0.03 ng/mL (ref <0.031)

## 2015-01-23 MED ORDER — FLUORESCEIN SODIUM 1 MG OP STRP
ORAL_STRIP | OPHTHALMIC | Status: AC
  Administered 2015-01-23: 12:00:00
  Filled 2015-01-23: qty 2

## 2015-01-23 MED ORDER — MOXIFLOXACIN HCL 0.5 % OP SOLN: 1.0000 [drp] | Freq: Three times a day (TID) | OPHTHALMIC | Status: DC

## 2015-01-23 MED ORDER — TETRACAINE HCL 0.5 % OP SOLN
2.0000 [drp] | Freq: Once | OPHTHALMIC | Status: AC
Start: 2015-01-23 — End: 2015-01-23
  Administered 2015-01-23: 2 [drp] via OPHTHALMIC

## 2015-01-23 MED ORDER — MOXIFLOXACIN HCL 0.5 % OP SOLN
1.0000 [drp] | Freq: Three times a day (TID) | OPHTHALMIC | Status: DC
  Filled 2015-01-23: qty 3

## 2015-01-23 MED ORDER — TETRACAINE HCL 0.5 % OP SOLN
OPHTHALMIC | Status: AC
  Filled 2015-01-23: qty 2

## 2015-01-23 MED ORDER — LEVOFLOXACIN 0.5 % OP SOLN: 1.0000 [drp] | OPHTHALMIC | Status: AC

## 2015-01-23 MED ORDER — ASPIRIN 81 MG PO CHEW
324.0000 mg | CHEWABLE_TABLET | Freq: Once | ORAL | Status: AC
  Administered 2015-01-23: 324 mg via ORAL
  Filled 2015-01-23: qty 4

## 2015-01-23 NOTE — ED Provider Notes (Signed)
CSN: [id number]     Arrival date & time 01/23/15  6754 History   First MD Initiated Contact with Patient 01/23/15 1025     No chief complaint on file.    (Consider location/radiation/quality/duration/timing/severity/associated sxs/prior Treatment) HPI Comments: Left eye sensitive to light +Drainage, clear tearing Severe pain worse with light No pain with EOM Sinus drainage, no sore throat Chest tightness started last night, constant, nothing better or worse, no SOB/diaphoresis. Yes nausea. Not exertional.  No chest tightness  No hx of CAD, mom had MI at 61 No smoking, other drugs No recent surgery, cancer  No hx of eye prob, wear contacts   Patient is a 39 y.o. female presenting with eye pain.  Eye Pain This is a new problem. The current episode started yesterday. The problem occurs constantly. The problem has not changed since onset.Associated symptoms include chest pain. Pertinent negatives include no abdominal pain, no headaches and no shortness of breath. Nothing aggravates the symptoms. Nothing relieves the symptoms. She has tried nothing for the symptoms. The treatment provided no relief.    Past Medical History  Diagnosis Date  . Diabetes mellitus without complication ([place])   . Asthma   . GERD (gastroesophageal reflux disease)   . Anemia    Past Surgical History  Procedure Laterality Date  . Cervical biopsy  w/ loop electrode excision    . Whipple procedure  2008  . Cervical tumor removed    . Dilation and curettage of uterus  2012  . Leep    . Robot assisted myomectomy N/A 08/23/2013    Procedure: ROBOTIC ASSISTED MYOMECTOMY;  Surgeon: Lahoma Crocker, MD;  Location: [facility];  Service: Gynecology;  Laterality: N/A;   Family History  Problem Relation Age of Onset  . Stroke Mother   . Hypertension Mother   . Diabetes Father   . Hypertension Maternal Grandmother   . Dementia Maternal Grandmother   . Cancer Maternal Grandfather     bone  . Diabetes Paternal  Grandmother   . Hypertension Paternal Grandmother   . Prostate cancer Paternal Grandfather   . Colon cancer Neg Hx   . Breast cancer Paternal Aunt   . Heart disease Maternal Grandmother   . Heart disease Paternal Grandmother   . Colon polyps Neg Hx   . Esophageal cancer Neg Hx    Social History  Substance Use Topics  . Smoking status: Former Smoker    Types: Cigarettes    Quit date: 03/21/2006  . Smokeless tobacco: Never Used  . Alcohol Use: No   OB History    Gravida Para Term Preterm AB TAB SAB Ectopic Multiple Living   4    4  4    0     Review of Systems  Constitutional: Negative for fever.  HENT: Negative for sore throat.   Eyes: Positive for photophobia, pain, discharge (tearing) and visual disturbance.  Respiratory: Negative for cough and shortness of breath.   Cardiovascular: Positive for chest pain.  Gastrointestinal: Positive for nausea. Negative for vomiting, abdominal pain and diarrhea.  Genitourinary: Negative for difficulty urinating.  Musculoskeletal: Negative for back pain and neck pain.  Skin: Negative for rash.  Neurological: Negative for syncope and headaches.      Allergies  Sulfa antibiotics; Amoxicillin; and Silver  Home Medications   Prior to Admission medications   Medication Sig Start Date End Date Taking? Authorizing Provider  albuterol (PROVENTIL HFA;VENTOLIN HFA) 108 (90 BASE) MCG/ACT inhaler Inhale 1-2 puffs into the lungs every 6 (  six) hours as needed for wheezing or shortness of breath. 02/05/14  Yes Elnora Morrison, MD  citalopram (CELEXA) 40 MG tablet Take 1 tablet (40 mg total) by mouth daily. 12/30/14  Yes Midge Minium, MD  glucose blood (ONE TOUCH ULTRA TEST) test strip 1 each by Other route 2 (two) times daily. And lancets 2/day. 03/03/14  Yes Renato Shin, MD  insulin detemir (LEVEMIR) 100 UNIT/ML injection Inject 0.28 mLs (28 Units total) into the skin daily. And syringes 1/day Patient taking differently: Inject 25 Units into  the skin every morning.  06/12/14  Yes Renato Shin, MD  Melatonin 3 MG CAPS Take 3 mg by mouth at bedtime as needed (for sleep).    Yes Historical Provider, MD  rizatriptan (MAXALT) 10 MG tablet Take 1 tablet (10 mg total) by mouth as needed for migraine. May repeat in 2 hours if needed 05/22/14  Yes Midge Minium, MD  topiramate (TOPAMAX) 25 MG tablet 25mg  QHS x 1 week, then 25mg  BID x1 week, then 25mg  qAM and 50mg  QHS x1 week, and then 50mg  BID 05/22/14  Yes Midge Minium, MD  levofloxacin Theodoro Clock) 0.5 % ophthalmic solution Place 1 drop into the left eye See admin instructions. 2 drops every 2 hours while awake on days 1 and 2 (max 8 times per day). 1-2 drops every 4 hours while awake on days 3-7 (max 4 times/day) 01/23/15 01/30/15  Gareth Morgan, MD  moxifloxacin (VIGAMOX) 0.5 % ophthalmic solution Place 1 drop into the left eye 3 (three) times daily. 01/23/15   Gareth Morgan, MD   BP 107/65 mmHg  Pulse 74  Temp(Src) 98.5 F (36.9 C) (Oral)  Resp 18  Ht 5\' 10"  (1.778 m)  Wt 190 lb (86.183 kg)  BMI 27.26 kg/m2  SpO2 100%  LMP 01/07/2015 Physical Exam  Constitutional: She is oriented to person, place, and time. She appears well-developed and well-nourished. No distress.  HENT:  Head: Normocephalic and atraumatic.  Eyes: Conjunctivae and EOM are normal. Pupils are equal, round, and reactive to light. Right conjunctiva is not injected. Right conjunctiva has no hemorrhage. Left conjunctiva is not injected. Left conjunctiva has no hemorrhage.  Fluorescein no sign of abrasion  Neck: Normal range of motion.  Cardiovascular: Normal rate, regular rhythm, normal heart sounds and intact distal pulses.  Exam reveals no gallop and no friction rub.   No murmur heard. Pulmonary/Chest: Effort normal and breath sounds normal. No respiratory distress. She has no wheezes. She has no rales.  Abdominal: Soft. She exhibits no distension. There is no tenderness. There is no guarding.  Musculoskeletal:  She exhibits no edema or tenderness.  Neurological: She is alert and oriented to person, place, and time.  Skin: Skin is warm and dry. No rash noted. She is not diaphoretic. No erythema.  Nursing note and vitals reviewed.   ED Course  Procedures (including critical care time) Labs Review Labs Reviewed  CBC WITH DIFFERENTIAL/PLATELET - Abnormal; Notable for the following:    RBC 3.79 (*)    Hemoglobin 10.3 (*)    HCT 32.5 (*)    All other components within normal limits  COMPREHENSIVE METABOLIC PANEL - Abnormal; Notable for the following:    Glucose, Bld 168 (*)    Creatinine, Ser 0.38 (*)    Calcium 8.3 (*)    All other components within normal limits  TROPONIN I    Imaging Review Dg Chest 2 View  01/23/2015  CLINICAL DATA:  Chest pain, shortness of breath  since this morning EXAM: CHEST - 2 VIEW COMPARISON:  03/18/2013 FINDINGS: Lungs are clear. Heart size and mediastinal contours are within normal limits. No effusion. Visualized skeletal structures are unremarkable. IMPRESSION: No acute cardiopulmonary disease. Electronically Signed   By: Lucrezia Europe M.D.   On: 01/23/2015 12:34   I have personally reviewed and evaluated these images and lab results as part of my medical decision-making.   EKG Interpretation   Date/Time:  Friday January 23 2015 12:36:13 EDT Ventricular Rate:  64 PR Interval:  188 QRS Duration: 82 QT Interval:  474 QTC Calculation: 489 R Axis:   58 Text Interpretation:  Normal sinus rhythm Prolonged QT Abnormal ECG ED  PHYSICIAN INTERPRETATION AVAILABLE IN [facility] Confirmed by TEST,  Record ([id number]) on 01/24/2015 9:17:51 AM      MDM   Final diagnoses:  Chest tightness  Irritation of left eye, concern by history for early keratitis, however no sign of ulcer on exam   39yo female with history of DM presents with left eye irritation after sleeping in contacts yesterday.  Patient also reports chest tightness beginning last night.  Regarding chest  pain, ekg, cxr, troponin WNL and given duration of symptoms constant since last night have low suspicion for ACS. Hx, risk factors, exam not consistent with PE/dissection.  Recommend PCP follow up in 1 week for CP.  Regarding eye pain, eyes stained without sign of abrasion. Pt PERRLA.  Visual acuity 20/40 and 20/50. History concerning given contact lense wearing, photophobia, tearing, pain for possible early bacterial keratitis, however normal fluroscein exam and patient reports improvement of pain while in ED and have low suspicion fo this.  Given initial symptoms, contact lens wearer, will treat with fluoroquinolone eye drops and recommend Ophthalmology appt if symptoms worsen or persist.  Pt states understanding. Patient discharged in stable condition with understanding of reasons to return.     Gareth Morgan, MD 01/24/15 1152

## 2015-01-23 NOTE — ED Notes (Signed)
C/o Left eye irritation , slept with contacts in and took them out during the night and now left eye is more irritated.

## 2015-01-30 ENCOUNTER — Ambulatory Visit (INDEPENDENT_AMBULATORY_CARE_PROVIDER_SITE_OTHER): Payer: 59 | Admitting: Family Medicine

## 2015-01-30 ENCOUNTER — Encounter: Payer: Self-pay | Admitting: Family Medicine

## 2015-01-30 VITALS — BP 112/74 | HR 71 | Temp 98.0°F | Resp 16 | Ht 70.0 in | Wt 207.1 lb

## 2015-01-30 DIAGNOSIS — E1065 Type 1 diabetes mellitus with hyperglycemia: Secondary | ICD-10-CM

## 2015-01-30 DIAGNOSIS — F329 Major depressive disorder, single episode, unspecified: Secondary | ICD-10-CM

## 2015-01-30 DIAGNOSIS — E109 Type 1 diabetes mellitus without complications: Secondary | ICD-10-CM

## 2015-01-30 DIAGNOSIS — E663 Overweight: Secondary | ICD-10-CM | POA: Diagnosis not present

## 2015-01-30 DIAGNOSIS — IMO0001 Reserved for inherently not codable concepts without codable children: Secondary | ICD-10-CM

## 2015-01-30 DIAGNOSIS — F32A Depression, unspecified: Secondary | ICD-10-CM

## 2015-01-30 LAB — CBC WITH DIFFERENTIAL/PLATELET
Basophils Absolute: 0 10*3/uL (ref 0.0–0.1)
Basophils Relative: 0.3 % (ref 0.0–3.0)
EOS PCT: 2.6 % (ref 0.0–5.0)
Eosinophils Absolute: 0.2 10*3/uL (ref 0.0–0.7)
HCT: 32.3 % — ABNORMAL LOW (ref 36.0–46.0)
HEMOGLOBIN: 10.5 g/dL — AB (ref 12.0–15.0)
Lymphocytes Relative: 25.4 % (ref 12.0–46.0)
Lymphs Abs: 2.2 10*3/uL (ref 0.7–4.0)
MCHC: 32.4 g/dL (ref 30.0–36.0)
MCV: 83.5 fl (ref 78.0–100.0)
MONOS PCT: 5.9 % (ref 3.0–12.0)
Monocytes Absolute: 0.5 10*3/uL (ref 0.1–1.0)
Neutro Abs: 5.7 10*3/uL (ref 1.4–7.7)
Neutrophils Relative %: 65.8 % (ref 43.0–77.0)
Platelets: 166 10*3/uL (ref 150.0–400.0)
RBC: 3.87 Mil/uL (ref 3.87–5.11)
RDW: 14.6 % (ref 11.5–15.5)
WBC: 8.7 10*3/uL (ref 4.0–10.5)

## 2015-01-30 LAB — TSH: TSH: 1.17 u[IU]/mL (ref 0.35–4.50)

## 2015-01-30 LAB — LIPID PANEL
CHOL/HDL RATIO: 3
Cholesterol: 148 mg/dL (ref 0–200)
HDL: 44.4 mg/dL (ref >39.00)
LDL CALC: 83 mg/dL (ref 0–99)
NONHDL: 103.16
TRIGLYCERIDES: 100 mg/dL (ref 0.0–149.0)
VLDL: 20 mg/dL (ref 0.0–40.0)

## 2015-01-30 LAB — HEPATIC FUNCTION PANEL
ALBUMIN: 4.1 g/dL (ref 3.5–5.2)
ALK PHOS: 54 U/L (ref 39–117)
ALT: 17 U/L (ref 0–35)
AST: 20 U/L (ref 0–37)
BILIRUBIN TOTAL: 0.3 mg/dL (ref 0.2–1.2)
Bilirubin, Direct: 0 mg/dL (ref 0.0–0.3)
Total Protein: 7.1 g/dL (ref 6.0–8.3)

## 2015-01-30 LAB — BASIC METABOLIC PANEL
BUN: 4 mg/dL — AB (ref 6–23)
CO2: 29 mEq/L (ref 19–32)
CREATININE: 0.56 mg/dL (ref 0.40–1.20)
Calcium: 9 mg/dL (ref 8.4–10.5)
Chloride: 100 mEq/L (ref 96–112)
GFR: 154.96 mL/min (ref >60.00)
Glucose, Bld: 226 mg/dL — ABNORMAL HIGH (ref 70–99)
POTASSIUM: 3.9 meq/L (ref 3.5–5.1)
Sodium: 134 mEq/L — ABNORMAL LOW (ref 135–145)

## 2015-01-30 LAB — HEMOGLOBIN A1C: Hgb A1c MFr Bld: 9.4 % — ABNORMAL HIGH (ref 4.6–6.5)

## 2015-01-30 MED ORDER — CITALOPRAM HYDROBROMIDE 40 MG PO TABS: 40.0000 mg | ORAL_TABLET | Freq: Every day | ORAL | Status: DC

## 2015-01-30 NOTE — Patient Instructions (Signed)
Schedule your complete physical in 6 months Please schedule an appt w/ Dr Primus Bravo notify you of your lab results and make any changes if needed Continue to work on healthy diet and regular exercise We'll call you with your eye exam Continue to take the celexa daily Call with any questions or concerns If you want to join [place] at the new [place] office, any scheduled appointments will automatically transfer and we will see you at [address] [place] [address] [place], [place] [address]  Happy Holidays!!!

## 2015-01-30 NOTE — Assessment & Plan Note (Signed)
New.  Pt has gained 10 lbs since last visit w/ me.  Pt is not exercising nor following low carb diet.  Stressed need for both.  Check labs to risk stratify.  Will follow.

## 2015-01-30 NOTE — Assessment & Plan Note (Signed)
Chronic problem.  Adequate control on Celexa.  Pt is not interested in adjusting or changing medication.  Refill provided.  Will continue to follow.

## 2015-01-30 NOTE — Progress Notes (Signed)
   Subjective:    Patient ID: Kristina Neal, female    DOB: 05-Nov-1975, 39 y.o.   MRN: [id number]  HPI Depression- chronic problem, pt reports mood is well controlled on Celexa.  Is very aware when she is out of meds- increased anger and irritability.  DM- chronic problem, pt cancelled Endo appt in August.  Pt reports sugars have been very labile.  Overdue on eye exam.  No CP, SOB, HAs, visual changes, edema, abd pain.  + nausea, no vomiting.  Overweight- gained 10 lbs since March.  No regular exercise.  Not following low carb diet.   Review of Systems For ROS see HPI     Objective:   Physical Exam  Constitutional: She is oriented to person, place, and time. She appears well-developed and well-nourished. No distress.  overweight  HENT:  Head: Normocephalic and atraumatic.  Eyes: Conjunctivae and EOM are normal. Pupils are equal, round, and reactive to light.  Neck: Normal range of motion. Neck supple. No thyromegaly present.  Cardiovascular: Normal rate, regular rhythm, normal heart sounds and intact distal pulses.   No murmur heard. Pulmonary/Chest: Effort normal and breath sounds normal. No respiratory distress.  Abdominal: Soft. She exhibits no distension. There is no tenderness.  Musculoskeletal: She exhibits no edema.  Lymphadenopathy:    She has no cervical adenopathy.  Neurological: She is alert and oriented to person, place, and time.  Skin: Skin is warm and dry.  Psychiatric: She has a normal mood and affect. Her behavior is normal.  Vitals reviewed.         Assessment & Plan:

## 2015-01-30 NOTE — Progress Notes (Signed)
Pre visit review using our clinic review tool, if applicable. No additional management support is needed unless otherwise documented below in the visit note. 

## 2015-01-30 NOTE — Assessment & Plan Note (Signed)
Chronic problem.  Pt cancelled f/u w/ Dr [name].  Reports CBGs are very labile and having nausea x1 month.  Check labs.  Encouraged her to schedule an appt w/ Dr [name] for adjustment on her insulin regimen.  Referral entered for eye exam.  Stressed need for low carb diet and regular exercise.  Pt expressed understanding and is in agreement w/ plan.

## 2015-02-02 ENCOUNTER — Telehealth: Payer: Self-pay | Admitting: Endocrinology

## 2015-02-02 NOTE — Telephone Encounter (Signed)
I contacted the pt and advise her office visit is due and Dr. [name] would go over her A1C then. Pt scheduled for 02/05/2015.

## 2015-02-02 NOTE — Telephone Encounter (Signed)
Pt has a 9.4 hgb a1c, Dr. Birdie Riddle checked it, what should the pt do.

## 2015-02-05 ENCOUNTER — Telehealth: Payer: Self-pay | Admitting: Endocrinology

## 2015-02-05 ENCOUNTER — Ambulatory Visit (INDEPENDENT_AMBULATORY_CARE_PROVIDER_SITE_OTHER): Payer: 59 | Admitting: Endocrinology

## 2015-02-05 ENCOUNTER — Encounter: Payer: Self-pay | Admitting: Endocrinology

## 2015-02-05 ENCOUNTER — Telehealth: Payer: Self-pay | Admitting: Family Medicine

## 2015-02-05 VITALS — BP 112/70 | HR 86 | Temp 98.9°F | Ht 70.0 in | Wt 208.0 lb

## 2015-02-05 DIAGNOSIS — E1065 Type 1 diabetes mellitus with hyperglycemia: Principal | ICD-10-CM

## 2015-02-05 DIAGNOSIS — IMO0001 Reserved for inherently not codable concepts without codable children: Secondary | ICD-10-CM

## 2015-02-05 DIAGNOSIS — E109 Type 1 diabetes mellitus without complications: Secondary | ICD-10-CM

## 2015-02-05 DIAGNOSIS — E119 Type 2 diabetes mellitus without complications: Secondary | ICD-10-CM

## 2015-02-05 DIAGNOSIS — Z0100 Encounter for examination of eyes and vision without abnormal findings: Principal | ICD-10-CM

## 2015-02-05 MED ORDER — GLUCOSE BLOOD VI STRP: 1.0000 | ORAL_STRIP | Freq: Two times a day (BID) | Status: DC

## 2015-02-05 NOTE — Patient Instructions (Addendum)
check your blood sugar twice a day.  vary the time of day when you check, between before the 3 meals, and at bedtime.  also check if you have symptoms of your blood sugar being too high or too low.  please keep a record of the readings and bring it to your next appointment here.  You can write it on any piece of paper.  please call us sooner if your blood sugar goes below 70, or if you have a lot of readings over 200.   In view of your medical condition, you should avoid pregnancy until we have decided it is safe.   Please let me know if you decide to see a dietician. Before we can increase the insulin, we need to reduce the frequency of lows.  Try eating more before you sleep in the morning, and less at other times. Please come back for a follow-up appointment in 3 months.

## 2015-02-05 NOTE — Telephone Encounter (Signed)
Next step is apt with Kristina Neal.  you will receive a phone call, about a day and time for an appointment

## 2015-02-05 NOTE — Telephone Encounter (Addendum)
Patient stated that [employer] will cover the pump, they need a prescription sent over to  [facility], [place]. [phone] (Phone) [phone] (Fax)

## 2015-02-05 NOTE — Telephone Encounter (Signed)
Pt needs testing strips too please

## 2015-02-05 NOTE — Telephone Encounter (Signed)
Pt calling to ask about referral to eye doctor for diabetic eye exam

## 2015-02-05 NOTE — Telephone Encounter (Signed)
I contacted the pt and advised of note below. Pt voiced understanding.  

## 2015-02-05 NOTE — Telephone Encounter (Signed)
See note below about pump. Rx for test strips have been sent.

## 2015-02-05 NOTE — Progress Notes (Signed)
Subjective:    Patient ID: Kristina Neal, female    DOB: 09-13-1975, 39 y.o.   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   Dx'ed: [id number] Complications: none Therapy: insulin since 2011, when she had DKA.   GDM: never.  DKA: as above, and again in 2014.  Severe hypoglycemia: never. Pancreatitis: never.   Other: due to noncompliance, she is on a simple QD insulin schedule.  She works 3rd shift, in [employer], [employer].   Interval history: no cbg record, but states cbg's vary widely.  It is lowest after she sleeps during the day, after working the night shift.  It is higher at other times (but not necessarily related to meals).  Pt says she last missed a dose of insulin 1 month ago.   Past Medical History  Diagnosis Date  . Diabetes mellitus without complication ([place])   . Asthma   . GERD (gastroesophageal reflux disease)   . Anemia     Past Surgical History  Procedure Laterality Date  . Cervical biopsy  w/ loop electrode excision    . Whipple procedure  2008  . Cervical tumor removed    . Dilation and curettage of uterus  2012  . Leep    . Robot assisted myomectomy N/A 08/23/2013    Procedure: ROBOTIC ASSISTED MYOMECTOMY;  Surgeon: Lahoma Crocker, MD;  Location: [facility];  Service: Gynecology;  Laterality: N/A;    Social History   Social History  . Marital Status: Legally Separated    Spouse Name: N/A  . Number of Children: 0  . Years of Education: N/A   Occupational History  . CSR    Social History Main Topics  . Smoking status: Former Smoker    Types: Cigarettes    Quit date: 03/21/2006  . Smokeless tobacco: Never Used  . Alcohol Use: No  . Drug Use: No  . Sexual Activity:    Partners: Male    Birth Control/ Protection: None   Other Topics Concern  . Not on file   Social History Narrative    Current Outpatient Prescriptions on File Prior to Visit    Medication Sig Dispense Refill  . albuterol (PROVENTIL HFA;VENTOLIN HFA) 108 (90 BASE) MCG/ACT inhaler Inhale 1-2 puffs into the lungs every 6 (six) hours as needed for wheezing or shortness of breath. 1 Inhaler 0  . citalopram (CELEXA) 40 MG tablet Take 1 tablet (40 mg total) by mouth daily. 30 tablet 6  . insulin detemir (LEVEMIR) 100 UNIT/ML injection Inject 0.28 mLs (28 Units total) into the skin daily. And syringes 1/day (Patient taking differently: Inject 25 Units into the skin every morning. ) 10 mL 11  . Melatonin 3 MG CAPS Take 3 mg by mouth at bedtime as needed (for sleep).     Marland Kitchen PRESCRIPTION MEDICATION Spirax- menstrual cramps.    . rizatriptan (MAXALT) 10 MG tablet Take 1 tablet (10 mg total) by mouth as needed for migraine. May repeat in 2 hours if needed 10 tablet 3  . topiramate (TOPAMAX) 25 MG tablet 25mg  QHS x 1 week, then 25mg  BID x1 week, then 25mg  qAM and 50mg  QHS x1 week, and then 50mg  BID 120 tablet 3   No current facility-administered medications on file prior to visit.    Allergies  Allergen Reactions  . Sulfa Antibiotics Other (See Comments)    Causes headache.  . Amoxicillin  Hives and Rash    Has patient had a PCN reaction causing immediate rash, facial/tongue/throat swelling, SOB or lightheadedness with hypotension:Yes Has patient had a PCN reaction causing severe rash involving mucus membranes or skin necrosis:Yes Has patient had a PCN reaction that required hospitalization:No Has patient had a PCN reaction occurring within the last 10 years:Yes If all of the above answers are "NO", then may proceed with Cephalosporin use.   Cathie Olden Rash    Family History  Problem Relation Age of Onset  . Stroke Mother   . Hypertension Mother   . Diabetes Father   . Hypertension Maternal Grandmother   . Dementia Maternal Grandmother   . Cancer Maternal Grandfather     bone  . Diabetes Paternal Grandmother   . Hypertension Paternal Grandmother   . Prostate cancer  Paternal Grandfather   . Colon cancer Neg Hx   . Breast cancer Paternal Aunt   . Heart disease Maternal Grandmother   . Heart disease Paternal Grandmother   . Colon polyps Neg Hx   . Esophageal cancer Neg Hx     BP 112/70 mmHg  Pulse 86  Temp(Src) 98.9 F (37.2 C) (Oral)  Ht 5\' 10"  (1.778 m)  Wt 208 lb (94.348 kg)  BMI 29.84 kg/m2  SpO2 98%  LMP 01/07/2015    Review of Systems Denies LOC.  She has gained weight    Objective:   Physical Exam VITAL SIGNS:  See vs page GENERAL: no distress Pulses: dorsalis pedis intact bilat.   MSK: no deformity of the feet CV: no leg edema.   Skin:  no ulcer on the feet.  normal color and temp on the feet. Neuro: sensation is intact to touch on the feet.     Lab Results  Component Value Date   HGBA1C 9.4* 01/30/2015      Assessment & Plan:  DM: she declines pump rx, due to cost.   i gave her brochure about V-GO, and I asked her to call her ins company.   Weight gain: this is the most likely reason for the worse a1c.  I advised her to redouble dietary efforts.   Occupational status: 3rd shift work complicates the rx of dm.  multiple daily injections would be much better for her.  She declines.   Patient is advised the following: Patient Instructions  check your blood sugar twice a day.  vary the time of day when you check, between before the 3 meals, and at bedtime.  also check if you have symptoms of your blood sugar being too high or too low.  please keep a record of the readings and bring it to your next appointment here.  You can write it on any piece of paper.  please call us sooner if your blood sugar goes below 70, or if you have a lot of readings over 200.   In view of your medical condition, you should avoid pregnancy until we have decided it is safe.   Please let me know if you decide to see a dietician. Before we can increase the insulin, we need to reduce the frequency of lows.  Try eating more before you sleep in the  morning, and less at other times. Please come back for a follow-up appointment in 3 months.

## 2015-02-06 NOTE — Telephone Encounter (Signed)
Referral placed.

## 2015-02-17 ENCOUNTER — Encounter: Payer: 59 | Admitting: Nutrition

## 2015-02-24 ENCOUNTER — Other Ambulatory Visit: Payer: Self-pay | Admitting: Endocrinology

## 2015-02-24 ENCOUNTER — Encounter: Payer: 59 | Attending: Endocrinology | Admitting: Nutrition

## 2015-02-24 DIAGNOSIS — IMO0002 Reserved for concepts with insufficient information to code with codable children: Secondary | ICD-10-CM

## 2015-02-24 DIAGNOSIS — Z713 Dietary counseling and surveillance: Secondary | ICD-10-CM | POA: Insufficient documentation

## 2015-02-24 DIAGNOSIS — E1065 Type 1 diabetes mellitus with hyperglycemia: Secondary | ICD-10-CM

## 2015-02-24 DIAGNOSIS — E108 Type 1 diabetes mellitus with unspecified complications: Secondary | ICD-10-CM

## 2015-02-24 MED ORDER — INSULIN ASPART 100 UNIT/ML ~~LOC~~ SOLN: SUBCUTANEOUS | Status: DC

## 2015-02-24 MED ORDER — V-GO 20 KIT: 1.0000 | PACK | Freq: Every day | Status: DC

## 2015-02-24 NOTE — Progress Notes (Signed)
Patient was instructed on how to fill, apply and use the V-go She filled a V-Go 20 with Novolog insulin and applied it to her abdomen.  She was shown how to give meal time insulin but told not to do this until she returns in one week to see Dr. [name].  She reported good understanding of this. She was also instucted to stop her levemir .  She agreed to do this.   She was told to test her blood sugars before meals and at bedtime.  She was given a sheet to record them, and reminded to bring it with her when she returns in one week.   She was told to call if she has any low blood sugars.   She had no final questions.

## 2015-02-24 NOTE — Patient Instructions (Signed)
Fill and apply a new V-go 20 every morning Test  Blood sugars before meals and at bedtime for the next 7 days.

## 2015-02-25 ENCOUNTER — Telehealth: Payer: Self-pay | Admitting: Endocrinology

## 2015-02-25 ENCOUNTER — Telehealth: Payer: Self-pay

## 2015-02-25 NOTE — Telephone Encounter (Signed)
Received a fax from the pt's pharmacy stating Novolog is not covered under her insurance. Humualog is the covered alternative. Can we change the medication? Thanks!

## 2015-02-25 NOTE — Telephone Encounter (Signed)
ok 

## 2015-02-25 NOTE — Telephone Encounter (Signed)
-----   Message from Brunilda Payor sent at 02/24/2015 11:13 AM EST ----- Regarding: patient Patient stated she couldn't come in a week, she didn't have the money for the copay

## 2015-02-26 MED ORDER — INSULIN LISPRO 100 UNIT/ML ~~LOC~~ SOLN: SUBCUTANEOUS | Status: DC

## 2015-02-26 NOTE — Telephone Encounter (Signed)
Rx submitted per pt's request.  

## 2015-03-10 ENCOUNTER — Encounter: Payer: Self-pay | Admitting: Endocrinology

## 2015-03-10 ENCOUNTER — Ambulatory Visit (INDEPENDENT_AMBULATORY_CARE_PROVIDER_SITE_OTHER): Payer: 59 | Admitting: Endocrinology

## 2015-03-10 ENCOUNTER — Encounter: Payer: 59 | Admitting: Nutrition

## 2015-03-10 VITALS — BP 118/82 | HR 68 | Temp 98.2°F | Ht 70.0 in | Wt 205.0 lb

## 2015-03-10 DIAGNOSIS — E1065 Type 1 diabetes mellitus with hyperglycemia: Secondary | ICD-10-CM | POA: Diagnosis not present

## 2015-03-10 DIAGNOSIS — IMO0002 Reserved for concepts with insufficient information to code with codable children: Secondary | ICD-10-CM

## 2015-03-10 DIAGNOSIS — E108 Type 1 diabetes mellitus with unspecified complications: Principal | ICD-10-CM

## 2015-03-10 DIAGNOSIS — Z713 Dietary counseling and surveillance: Secondary | ICD-10-CM | POA: Diagnosis not present

## 2015-03-10 NOTE — Patient Instructions (Addendum)
check your blood sugar twice a day.  vary the time of day when you check, between before the 3 meals, and at bedtime.  also check if you have symptoms of your blood sugar being too high or too low.  please keep a record of the readings and bring it to your next appointment here.  You can write it on any piece of paper.  please call us sooner if your blood sugar goes below 70, or if you have a lot of readings over 200.   In view of your medical condition, you should avoid pregnancy until we have decided it is safe.   Please continue the same V-GO settings (no clicks). A diabetes blood test is requested for you today.  We'll let you know about the results. Please come back for a follow-up appointment in 3 months.

## 2015-03-10 NOTE — Progress Notes (Signed)
Patient reports no difficulty filling, applying or using the V-go.  She does say that she tries to wear it longer, because insulin is still in there.  I explained again that it runs out of the basal insulin in 24 hours, and that it needs to be reapplied every 24 hours, or blood sugars will rise significantly.   She agreed to do this.  She has had 3 low blood sugars with giving no meal boluses.  One was when she started her menstral period, another was after she worked all night, and did not go to sleep, but stayed up and worked all morning.  The last one, was on a day when she worked a double shift.   Discussed the importance of eating small amount when not sleeping after work--maybe 150 calories.  Suggestions given for this.  Also discussed the importance of eating protein with all meals.  She says there is very little money for food now, and suggestions were given for cheaper protein sources like balogna and peanut butter.

## 2015-03-10 NOTE — Progress Notes (Signed)
 Subjective:    Patient ID: Kristina Neal, female    DOB: 05/10/1975, 39 y.o.   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   Dx'ed: 2008 Complications: none Therapy: insulin since 2011, when she had DKA.   GDM: never.  DKA: twice (2011 and 2014).  Severe hypoglycemia: never. Pancreatitis: never.   Other: due to noncompliance, she is on a simple QD insulin schedule.  She works 3rd shift, in manufacturing.   Interval history: She is now on V-GO pump.  she brings a record of her cbg's which i have reviewed today.  It varies from 56-200's.  There is no trend throughout the day.  She uses only the basal infusion (total 20 units/day), but no "clicks" (mealtime boluses). Past Medical History  Diagnosis Date  . Diabetes mellitus without complication (HCC)   . Asthma   . GERD (gastroesophageal reflux disease)   . Anemia     Past Surgical History  Procedure Laterality Date  . Cervical biopsy  w/ loop electrode excision    . Whipple procedure  2008  . Cervical tumor removed    . Dilation and curettage of uterus  2012  . Leep    . Robot assisted myomectomy N/A 08/23/2013    Procedure: ROBOTIC ASSISTED MYOMECTOMY;  Surgeon: Lisa [name], MD;  Location: [facility];  Service: Gynecology;  Laterality: N/A;    Social History   Social History  . Marital Status: Legally Separated    Spouse Name: N/A  . Number of Children: 0  . Years of Education: N/A   Occupational History  . CSR    Social History Main Topics  . Smoking status: Former Smoker    Types: Cigarettes    Quit date: 03/21/2006  . Smokeless tobacco: Never Used  . Alcohol Use: No  . Drug Use: No  . Sexual Activity:    Partners: Male    Birth Control/ Protection: None   Other Topics Concern  . Not on file   Social History Narrative    Current Outpatient Prescriptions on File Prior to Visit    Medication Sig Dispense Refill  . albuterol (PROVENTIL HFA;VENTOLIN HFA) 108 (90 BASE) MCG/ACT inhaler Inhale 1-2 puffs into the lungs every 6 (six) hours as needed for wheezing or shortness of breath. 1 Inhaler 0  . citalopram (CELEXA) 40 MG tablet Take 1 tablet (40 mg total) by mouth daily. 30 tablet 6  . glucose blood (ONE TOUCH ULTRA TEST) test strip 1 each by Other route 2 (two) times daily. And lancets 2/day. 100 each 12  . Insulin Disposable Pump (V-GO 20) KIT 1 Device by Does not apply route daily. 30 kit 11  . insulin lispro (HUMALOG) 100 UNIT/ML injection Use per insulin pump 30 units per day 10 mL 2  . Melatonin 3 MG CAPS Take 3 mg by mouth at bedtime as needed (for sleep).     . PRESCRIPTION MEDICATION Spirax- menstrual cramps.    . rizatriptan (MAXALT) 10 MG tablet Take 1 tablet (10 mg total) by mouth as needed for migraine. May repeat in 2 hours if needed 10 tablet 3  . topiramate (TOPAMAX) 25 MG tablet 25mg QHS x 1 week, then 25mg BID x1 week, then 25mg qAM and 50mg QHS x1 week, and then 50mg BID 120 tablet 3   No current facility-administered medications on file prior to visit.      Allergies  Allergen Reactions  . Sulfa Antibiotics Other (See Comments)    Causes headache.  . Amoxicillin Hives and Rash    Has patient had a PCN reaction causing immediate rash, facial/tongue/throat swelling, SOB or lightheadedness with hypotension:Yes Has patient had a PCN reaction causing severe rash involving mucus membranes or skin necrosis:Yes Has patient had a PCN reaction that required hospitalization:No Has patient had a PCN reaction occurring within the last 10 years:Yes If all of the above answers are "NO", then may proceed with Cephalosporin use.   Cathie Olden Rash    Family History  Problem Relation Age of Onset  . Stroke Mother   . Hypertension Mother   . Diabetes Father   . Hypertension Maternal Grandmother   . Dementia Maternal Grandmother   . Cancer Maternal Grandfather      bone  . Diabetes Paternal Grandmother   . Hypertension Paternal Grandmother   . Prostate cancer Paternal Grandfather   . Colon cancer Neg Hx   . Breast cancer Paternal Aunt   . Heart disease Maternal Grandmother   . Heart disease Paternal Grandmother   . Colon polyps Neg Hx   . Esophageal cancer Neg Hx     BP 118/82 mmHg  Pulse 68  Temp(Src) 98.2 F (36.8 C) (Oral)  Ht 5' 10" (1.778 m)  Wt 205 lb (92.987 kg)  BMI 29.41 kg/m2  SpO2 99%  Review of Systems Denies LOC    Objective:   Physical Exam VITAL SIGNS:  See vs page GENERAL: no distress Pulses: dorsalis pedis intact bilat.   MSK: no deformity of the feet CV: no leg edema Skin:  no ulcer on the feet.  normal color and temp on the feet. Neuro: sensation is intact to touch on the feet.   (I discussed with Leonia Reader, RN--pump trainer.  We decided to continue the same rx).    Assessment & Plan:  DM: apparently overcontrolled, despite no mealtime boluses.    Patient is advised the following: Patient Instructions  check your blood sugar twice a day.  vary the time of day when you check, between before the 3 meals, and at bedtime.  also check if you have symptoms of your blood sugar being too high or too low.  please keep a record of the readings and bring it to your next appointment here.  You can write it on any piece of paper.  please call us sooner if your blood sugar goes below 70, or if you have a lot of readings over 200.   In view of your medical condition, you should avoid pregnancy until we have decided it is safe.   Please continue the same V-GO settings (no clicks). A diabetes blood test is requested for you today.  We'll let you know about the results. Please come back for a follow-up appointment in 3 months.

## 2015-03-10 NOTE — Patient Instructions (Signed)
Fill and apply a new V-Go every 24 hours.  Do not go over the 24 hour time limit to change these, or blood sugars will go high.

## 2015-03-12 LAB — HM DIABETES EYE EXAM

## 2015-03-12 LAB — FRUCTOSAMINE: Fructosamine: 346 umol/L — ABNORMAL HIGH (ref 190–270)

## 2015-04-13 ENCOUNTER — Other Ambulatory Visit: Payer: Self-pay

## 2015-04-13 ENCOUNTER — Telehealth: Payer: Self-pay

## 2015-04-13 MED ORDER — INSULIN ASPART 100 UNIT/ML ~~LOC~~ SOLN: SUBCUTANEOUS | Status: DC

## 2015-04-13 NOTE — Telephone Encounter (Signed)
Rx sent and pt notified.

## 2015-04-13 NOTE — Telephone Encounter (Signed)
ok 

## 2015-04-13 NOTE — Telephone Encounter (Signed)
Humalog is no longer covered under the pt's insurance, can we change to novolog? Thanks!

## 2015-04-17 ENCOUNTER — Other Ambulatory Visit: Payer: Self-pay

## 2015-04-17 ENCOUNTER — Telehealth: Payer: Self-pay

## 2015-04-17 ENCOUNTER — Telehealth: Payer: Self-pay | Admitting: Endocrinology

## 2015-04-17 MED ORDER — INSULIN ASPART 100 UNIT/ML ~~LOC~~ SOLN: SUBCUTANEOUS | Status: DC

## 2015-04-17 NOTE — Telephone Encounter (Signed)
Pt called stating her novolog is costing 40$ and she wanted to know if we could do anything to help lower the amount. Pt was advised via voicemail to visit [email or web address] to activate a discount card. Pt was advised to call back if she had any issues accessing the discount card.

## 2015-04-17 NOTE — Telephone Encounter (Signed)
Patient returned your call.

## 2015-04-17 NOTE — Telephone Encounter (Signed)
I contacted the pt and she verified she was able to print the novolog discount card. Pt will take to the discount card with her and let us know if she has any issues picking the novolog up.

## 2015-05-04 IMAGING — CR DG KNEE COMPLETE 4+V*L*
4 series · 4 of 4 positions shown · non-contrast
Comparison: None.

CLINICAL DATA: Increasing knee pain and swelling for 6 days. Remote
knee injury 20 years ago.

EXAM:
LEFT KNEE - COMPLETE 4+ VIEW

[view not recorded (1 of 4)]
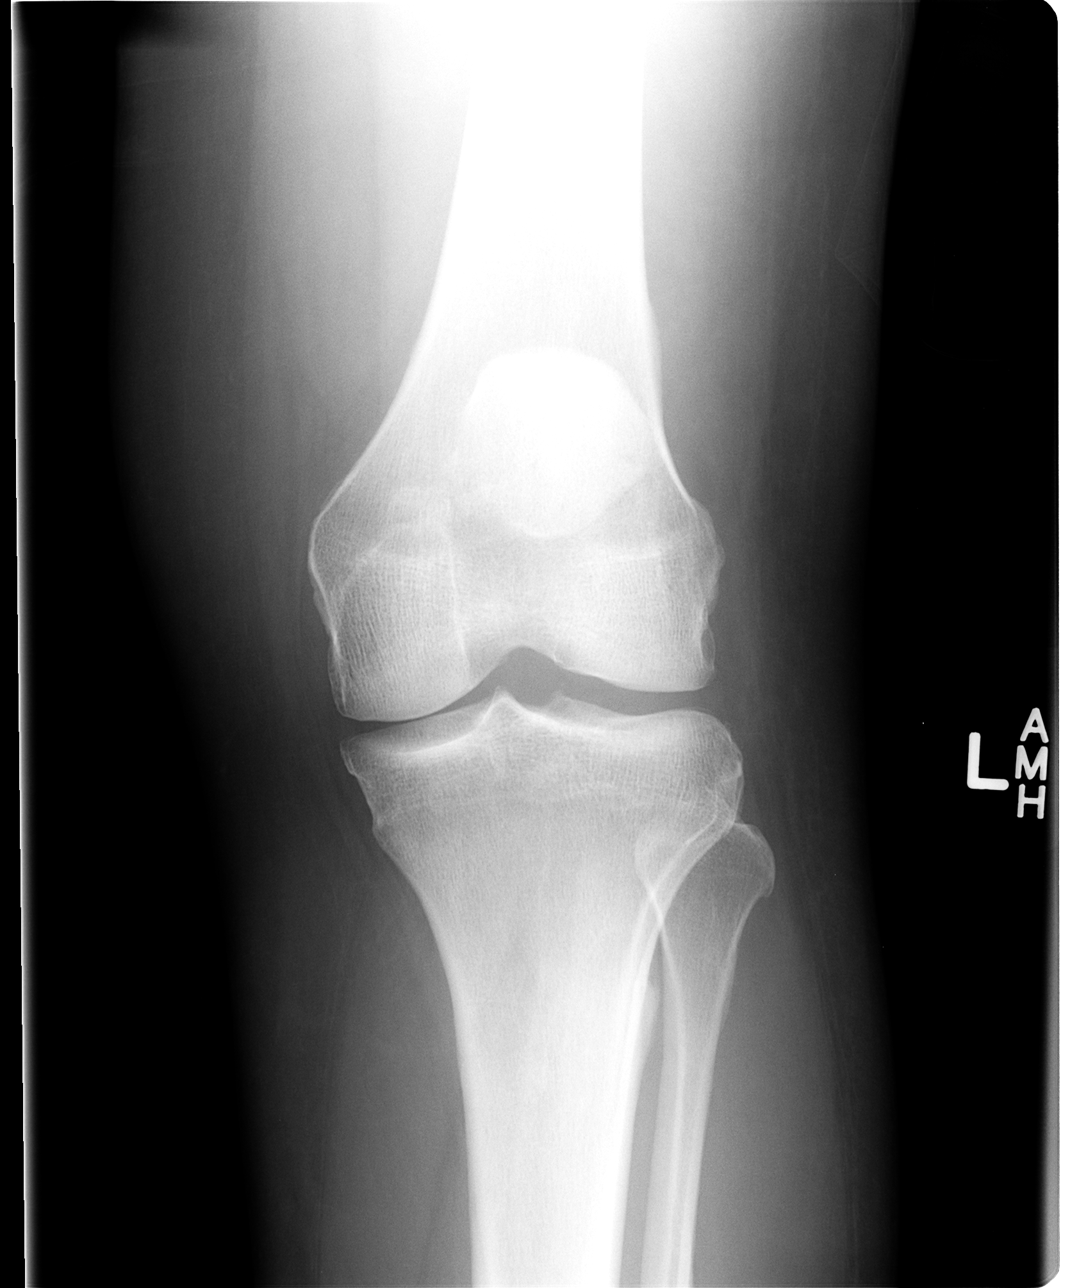

[view not recorded (2 of 4)]
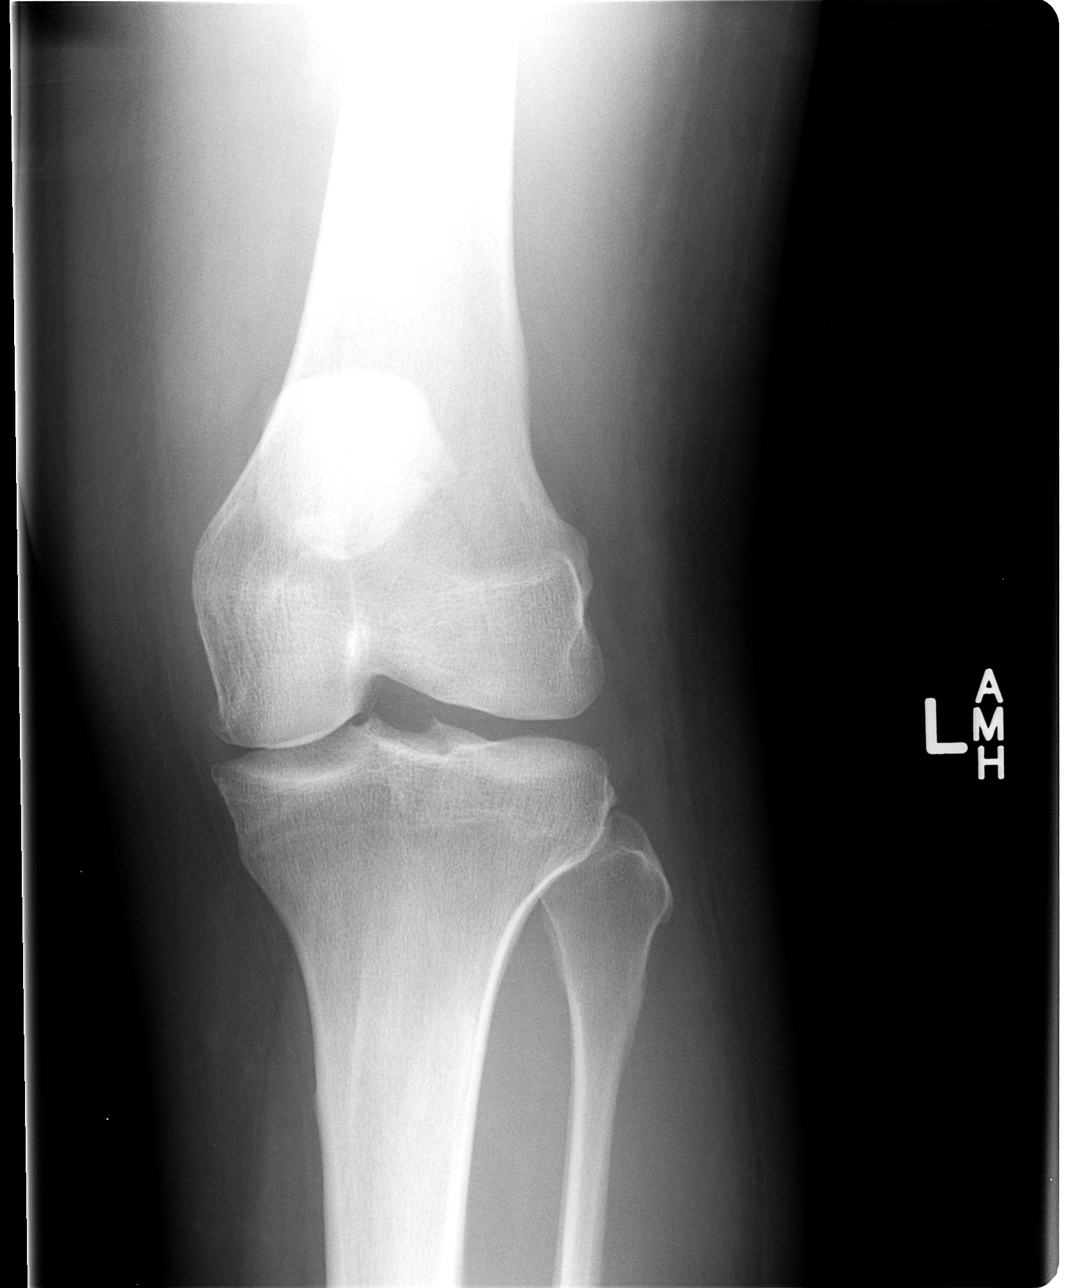

[view not recorded (3 of 4)]
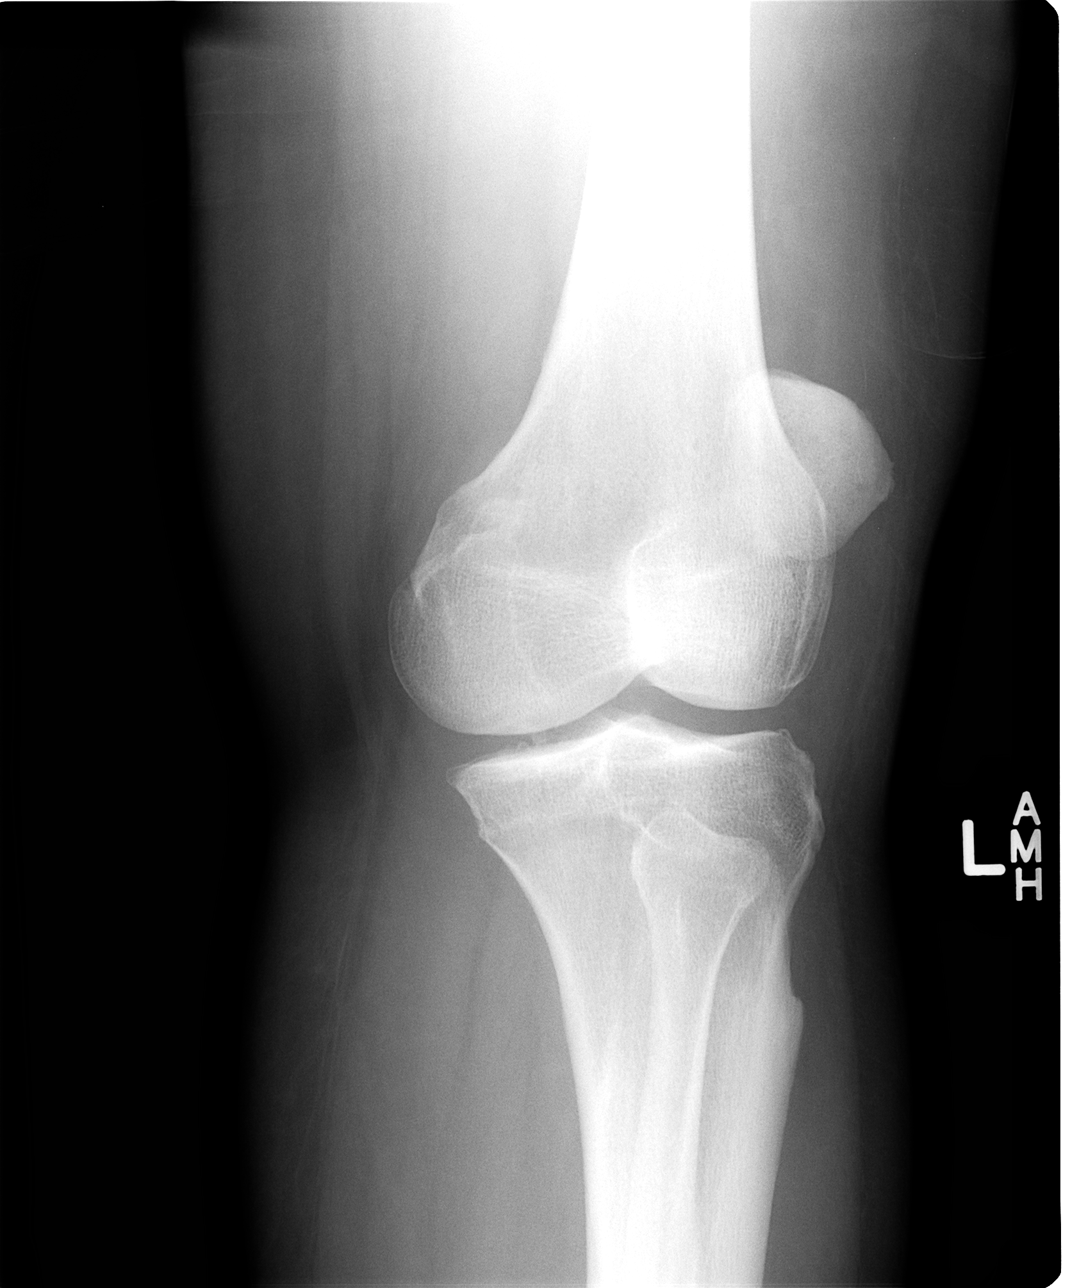

[view not recorded (4 of 4)]
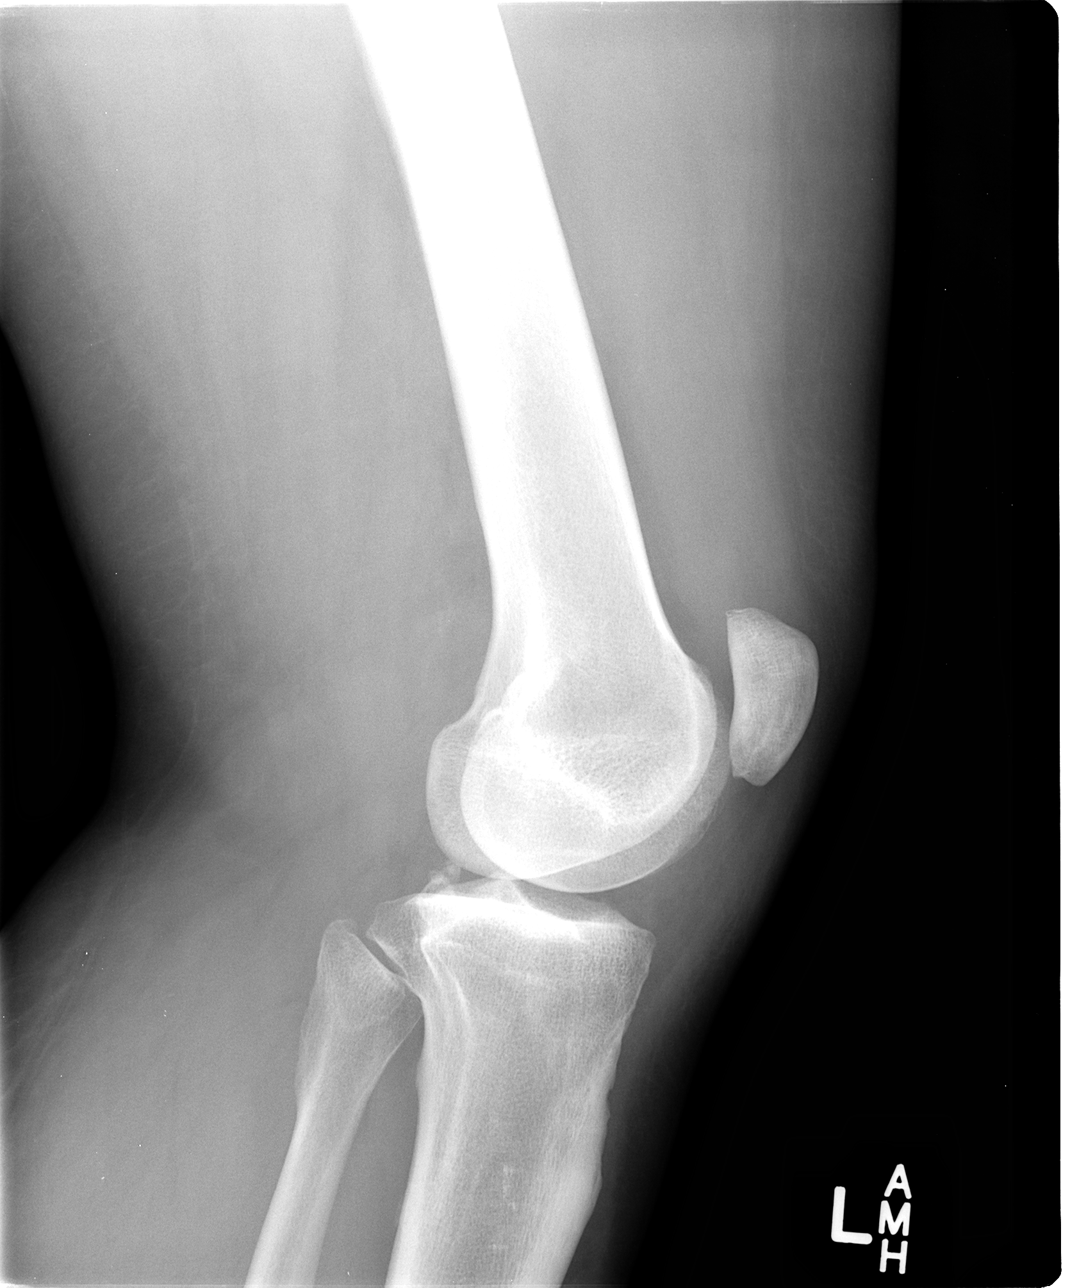

[4 of 4 positions shown; findings below may reference images not displayed]

FINDINGS: There is a small knee joint effusion. There is no evidence of acute
fracture or dislocation. Minimal medial compartment marginal
spurring is present. Joint widths are maintained. No focal lytic or
blastic osseous lesion is seen. No soft tissue abnormality is
identified.
IMPRESSION: Small knee joint effusion.  No acute osseous abnormality.

## 2015-05-07 ENCOUNTER — Ambulatory Visit: Payer: 59 | Admitting: Endocrinology

## 2015-06-10 ENCOUNTER — Other Ambulatory Visit: Payer: Self-pay | Admitting: *Deleted

## 2015-06-10 ENCOUNTER — Telehealth: Payer: Self-pay | Admitting: *Deleted

## 2015-06-10 MED ORDER — RIZATRIPTAN BENZOATE 10 MG PO TABS: 10.0000 mg | ORAL_TABLET | ORAL | Status: AC | PRN

## 2015-06-10 NOTE — Telephone Encounter (Signed)
Refill sent per [facility] refill protocol/SLS Last BP: 03/10/15, 118/82; 02/05/15, 112/70; 01/30/15, 112/74

## 2015-06-10 NOTE — Telephone Encounter (Signed)
Received fax from [place] requesting refill on Topiramate 25 mg with SIG: Take [2] tablets [total 50 mg] by mouth every day; last Sig stated:   topiramate 25 MG tablet  Commonly known as: TOPAMAX  25mg  QHS x 1 week, then 25mg  BID x1 week, then 25mg  qAM and 50mg  QHS x1 week, and then 50mg  BID  Clarification needed on daily dosage/SLS 03/22

## 2015-06-11 MED ORDER — TOPIRAMATE 50 MG PO TABS: 50.0000 mg | ORAL_TABLET | Freq: Two times a day (BID) | ORAL | Status: DC

## 2015-06-11 NOTE — Telephone Encounter (Signed)
Correct dose for 50mg  BID sent to the pharmacy.

## 2015-06-11 NOTE — Telephone Encounter (Signed)
Pt should have titrated up her meds to 50mg  BID at this time.  The initial prescription for 25mg  was just to titrate meds

## 2015-06-11 NOTE — Telephone Encounter (Signed)
Please advise on which dose pt should be on?

## 2015-07-31 ENCOUNTER — Encounter: Payer: Self-pay | Admitting: Family Medicine

## 2015-07-31 ENCOUNTER — Ambulatory Visit (INDEPENDENT_AMBULATORY_CARE_PROVIDER_SITE_OTHER): Payer: BLUE CROSS/BLUE SHIELD | Admitting: Family Medicine

## 2015-07-31 VITALS — BP 119/80 | HR 76 | Temp 98.0°F | Resp 16 | Ht 70.0 in | Wt 202.0 lb

## 2015-07-31 DIAGNOSIS — Z0000 Encounter for general adult medical examination without abnormal findings: Secondary | ICD-10-CM

## 2015-07-31 DIAGNOSIS — Z1231 Encounter for screening mammogram for malignant neoplasm of breast: Secondary | ICD-10-CM | POA: Diagnosis not present

## 2015-07-31 DIAGNOSIS — Z23 Encounter for immunization: Secondary | ICD-10-CM

## 2015-07-31 LAB — BASIC METABOLIC PANEL
BUN: 10 mg/dL (ref 6–23)
CALCIUM: 9.3 mg/dL (ref 8.4–10.5)
CO2: 23 mEq/L (ref 19–32)
Chloride: 96 mEq/L (ref 96–112)
Creatinine, Ser: 0.69 mg/dL (ref 0.40–1.20)
GFR: 121.47 mL/min (ref >60.00)
GLUCOSE: 497 mg/dL — AB (ref 70–99)
Potassium: 4 mEq/L (ref 3.5–5.1)
Sodium: 129 mEq/L — ABNORMAL LOW (ref 135–145)

## 2015-07-31 LAB — LIPID PANEL
CHOLESTEROL: 128 mg/dL (ref 0–200)
HDL: 38.6 mg/dL — AB (ref >39.00)
LDL CALC: 78 mg/dL (ref 0–99)
NonHDL: 89.07
TRIGLYCERIDES: 57 mg/dL (ref 0.0–149.0)
Total CHOL/HDL Ratio: 3
VLDL: 11.4 mg/dL (ref 0.0–40.0)

## 2015-07-31 LAB — HEPATIC FUNCTION PANEL
ALBUMIN: 4.3 g/dL (ref 3.5–5.2)
ALT: 22 U/L (ref 0–35)
AST: 39 U/L — ABNORMAL HIGH (ref 0–37)
Alkaline Phosphatase: 69 U/L (ref 39–117)
Bilirubin, Direct: 0.1 mg/dL (ref 0.0–0.3)
TOTAL PROTEIN: 7.3 g/dL (ref 6.0–8.3)
Total Bilirubin: 0.4 mg/dL (ref 0.2–1.2)

## 2015-07-31 LAB — CBC WITH DIFFERENTIAL/PLATELET
BASOS ABS: 0 10*3/uL (ref 0.0–0.1)
Basophils Relative: 0.4 % (ref 0.0–3.0)
Eosinophils Absolute: 0.1 10*3/uL (ref 0.0–0.7)
Eosinophils Relative: 1.1 % (ref 0.0–5.0)
HEMATOCRIT: 37.8 % (ref 36.0–46.0)
Hemoglobin: 12.4 g/dL (ref 12.0–15.0)
LYMPHS ABS: 1.4 10*3/uL (ref 0.7–4.0)
LYMPHS PCT: 20.2 % (ref 12.0–46.0)
MCHC: 32.8 g/dL (ref 30.0–36.0)
MCV: 82.7 fl (ref 78.0–100.0)
MONOS PCT: 5.6 % (ref 3.0–12.0)
Monocytes Absolute: 0.4 10*3/uL (ref 0.1–1.0)
NEUTROS PCT: 72.7 % (ref 43.0–77.0)
Neutro Abs: 5.1 10*3/uL (ref 1.4–7.7)
Platelets: 160 10*3/uL (ref 150.0–400.0)
RBC: 4.57 Mil/uL (ref 3.87–5.11)
RDW: 16.9 % — ABNORMAL HIGH (ref 11.5–15.5)
WBC: 7 10*3/uL (ref 4.0–10.5)

## 2015-07-31 LAB — TSH: TSH: 1.38 u[IU]/mL (ref 0.35–4.50)

## 2015-07-31 LAB — VITAMIN D 25 HYDROXY (VIT D DEFICIENCY, FRACTURES): VITD: 14.14 ng/mL — ABNORMAL LOW (ref 30.00–100.00)

## 2015-07-31 NOTE — Progress Notes (Signed)
Pre visit review using our clinic review tool, if applicable. No additional management support is needed unless otherwise documented below in the visit note. 

## 2015-07-31 NOTE — Progress Notes (Signed)
   Subjective:    Patient ID: Kristina Neal, female    DOB: [birth year], 40 y.o.   MRN: [id number]  HPI CPE- UTD on pap (Dr Willis Modena), UTD on eye exam.  Due for appt w/ Dr [name].  Would like referral for mammogram (paternal aunt w/ hx of breast cancer at age 51)   Review of Systems Patient reports no vision/ hearing changes, adenopathy,fever, weight change,  persistant/recurrent hoarseness , swallowing issues, chest pain, palpitations, edema, persistant/recurrent cough, hemoptysis, dyspnea (rest/exertional/paroxysmal nocturnal), gastrointestinal bleeding (melena, rectal bleeding), abdominal pain, significant heartburn, bowel changes, GU symptoms (dysuria, hematuria, incontinence), Gyn symptoms (abnormal  bleeding, pain),  syncope, focal weakness, memory loss, skin/hair/nail changes, abnormal bruising or bleeding, anxiety, or depression.   + numbness of L hand x1 week in April    Objective:   Physical Exam General Appearance:    Alert, cooperative, no distress, appears stated age  Head:    Normocephalic, without obvious abnormality, atraumatic  Eyes:    PERRL, conjunctiva/corneas clear, EOM's intact, fundi    benign, both eyes  Ears:    Normal TM's and external ear canals, both ears  Nose:   Nares normal, septum midline, mucosa normal, no drainage    or sinus tenderness  Throat:   Lips, mucosa, and tongue normal; teeth and gums normal  Neck:   Supple, symmetrical, trachea midline, no adenopathy;    Thyroid: no enlargement/tenderness/nodules  Back:     Symmetric, no curvature, ROM normal, no CVA tenderness  Lungs:     Clear to auscultation bilaterally, respirations unlabored  Chest Wall:    No tenderness or deformity   Heart:    Regular rate and rhythm, S1 and S2 normal, no murmur, rub   or gallop  Breast Exam:    Deferred to GYN  Abdomen:     Soft, non-tender, bowel sounds active all four quadrants,    no masses, no organomegaly  Genitalia:    Deferred to GYN  Rectal:     Extremities:   Extremities normal, atraumatic, no cyanosis or edema  Pulses:   2+ and symmetric all extremities  Skin:   Skin color, texture, turgor normal, no rashes or lesions  Lymph nodes:   Cervical, supraclavicular, and axillary nodes normal  Neurologic:   CNII-XII intact, normal strength, sensation and reflexes    throughout          Assessment & Plan:

## 2015-07-31 NOTE — Assessment & Plan Note (Signed)
Pt's PE WNL.  Will refer for mammo as pt is turning 40 and has + family hx.  UTD w/ GYN.  Pt to reschedule her Endo appt.  Check labs.  Anticipatory guidance provided. Pneumovax given.

## 2015-07-31 NOTE — Patient Instructions (Signed)
Follow up in 1 year or as needed We'll notify you of your lab results and make any changes if needed Continue to work on healthy diet and regular exercise- you look great! Reschedule your appt w/ Dr Primus Bravo call you with your mammogram appt Call with any questions or concerns Thanks for sticking with [place]! Have a great weekend!!!

## 2015-08-03 ENCOUNTER — Telehealth: Payer: Self-pay | Admitting: Endocrinology

## 2015-08-03 ENCOUNTER — Other Ambulatory Visit: Payer: Self-pay | Admitting: General Practice

## 2015-08-03 MED ORDER — VITAMIN D (ERGOCALCIFEROL) 1.25 MG (50000 UNIT) PO CAPS: 50000.0000 [IU] | ORAL_CAPSULE | ORAL | Status: DC

## 2015-08-03 MED ORDER — INSULIN GLARGINE 100 UNIT/ML SOLOSTAR PEN: 30.0000 [IU] | PEN_INJECTOR | Freq: Every day | SUBCUTANEOUS | Status: DC

## 2015-08-03 NOTE — Telephone Encounter (Signed)
See note below and please advise, Thanks! 

## 2015-08-03 NOTE — Telephone Encounter (Signed)
Pt said she went to see her PCP and her lab work came back and said that her sugar was close to 500.  She needs to know if she can get some insulin and how much she needs to take to get this down.

## 2015-08-03 NOTE — Telephone Encounter (Signed)
1.  If vomiting or sob, go to ER now 2.  If not, what are you taking for DM.  Are you still using the V-GO?

## 2015-08-03 NOTE — Telephone Encounter (Signed)
i have sent a prescription to your pharmacy, for qd insulin.

## 2015-08-03 NOTE — Telephone Encounter (Signed)
Pt advised.

## 2015-08-03 NOTE — Telephone Encounter (Signed)
I contacted the pt. Pt stated she is not experiencing any sob, or vomiting. Pt stated she has been off her insulin for about 3 weeks and has not been taking the v-go. Pt did state she had been exercising. Per verbal order from Dr. [name] he will review the formulary and send a once a day insulin for the pt to pick up tonight. Pt voiced understanding and scheduled a follow up for 08/07/2015 at 8 am.

## 2015-08-07 ENCOUNTER — Encounter: Payer: Self-pay | Admitting: Endocrinology

## 2015-08-07 ENCOUNTER — Telehealth: Payer: Self-pay | Admitting: Endocrinology

## 2015-08-07 ENCOUNTER — Ambulatory Visit (INDEPENDENT_AMBULATORY_CARE_PROVIDER_SITE_OTHER): Payer: BLUE CROSS/BLUE SHIELD | Admitting: Endocrinology

## 2015-08-07 VITALS — BP 112/74 | HR 76 | Temp 99.0°F | Wt 203.0 lb

## 2015-08-07 DIAGNOSIS — E1029 Type 1 diabetes mellitus with other diabetic kidney complication: Secondary | ICD-10-CM | POA: Diagnosis not present

## 2015-08-07 DIAGNOSIS — E1065 Type 1 diabetes mellitus with hyperglycemia: Secondary | ICD-10-CM

## 2015-08-07 DIAGNOSIS — IMO0002 Reserved for concepts with insufficient information to code with codable children: Secondary | ICD-10-CM

## 2015-08-07 LAB — MICROALBUMIN / CREATININE URINE RATIO
Creatinine,U: 213.4 mg/dL
Microalb Creat Ratio: 2.6 mg/g (ref 0.0–30.0)
Microalb, Ur: 5.6 mg/dL — ABNORMAL HIGH (ref 0.0–1.9)

## 2015-08-07 LAB — BASIC METABOLIC PANEL
BUN: 8 mg/dL (ref 6–23)
CALCIUM: 8.6 mg/dL (ref 8.4–10.5)
CO2: 25 mEq/L (ref 19–32)
CREATININE: 0.63 mg/dL (ref 0.40–1.20)
Chloride: 106 mEq/L (ref 96–112)
GFR: 134.91 mL/min (ref >60.00)
GLUCOSE: 294 mg/dL — AB (ref 70–99)
Potassium: 4.4 mEq/L (ref 3.5–5.1)
SODIUM: 135 meq/L (ref 135–145)

## 2015-08-07 LAB — POCT GLYCOSYLATED HEMOGLOBIN (HGB A1C): Hemoglobin A1C: 11.3

## 2015-08-07 MED ORDER — INSULIN PEN NEEDLE 31G X 5 MM MISC: Status: DC

## 2015-08-07 MED ORDER — INSULIN GLARGINE 100 UNIT/ML SOLOSTAR PEN: 30.0000 [IU] | PEN_INJECTOR | Freq: Every day | SUBCUTANEOUS | Status: DC

## 2015-08-07 MED ORDER — GLUCOSE BLOOD VI STRP: 1.0000 | ORAL_STRIP | Freq: Two times a day (BID) | Status: DC

## 2015-08-07 NOTE — Telephone Encounter (Signed)
Rx submitted

## 2015-08-07 NOTE — Telephone Encounter (Signed)
PT said that she needs the needles that go on the end of the Insulin Pens sent to [facility] on [address].

## 2015-08-07 NOTE — Progress Notes (Signed)
Subjective:    Patient ID: Kristina Neal, female    DOB: Jun 25, 1975, 40 y.o.   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   Dx'ed: [id number] Complications: none Therapy: insulin since 2011, when she had DKA.   GDM: never.  DKA: twice (2011 and 2014).  Severe hypoglycemia: never. Pancreatitis: never.   Other: due to noncompliance, she is on a simple QD insulin schedule.  She works 3rd shift, in [employer], educational.  She stopped V-GO pump, due to cost Interval history: Since called saying she was not taking any insulin.  She was rx'ed qd insulin, and was given f/u appt.  Hjowever, she has not picked up insulin rx.  She has moderate cramps in the legs and hands.  She has assoc excessive thirst Past Medical History  Diagnosis Date  . Diabetes mellitus without complication ([place])   . Asthma   . GERD (gastroesophageal reflux disease)   . Anemia     Past Surgical History  Procedure Laterality Date  . Cervical biopsy  w/ loop electrode excision    . Whipple procedure  2008  . Cervical tumor removed    . Dilation and curettage of uterus  2012  . Leep    . Robot assisted myomectomy N/A 08/23/2013    Procedure: ROBOTIC ASSISTED MYOMECTOMY;  Surgeon: Lahoma Crocker, MD;  Location: [facility];  Service: Gynecology;  Laterality: N/A;    Social History   Social History  . Marital Status: Legally Separated    Spouse Name: N/A  . Number of Children: 0  . Years of Education: N/A   Occupational History  . CSR    Social History Main Topics  . Smoking status: Former Smoker    Types: Cigarettes    Quit date: 03/21/2006  . Smokeless tobacco: Never Used  . Alcohol Use: No  . Drug Use: No  . Sexual Activity:    Partners: Male    Birth Control/ Protection: None   Other Topics Concern  . Not on file   Social History Narrative    Current Outpatient Prescriptions on File Prior to  Visit  Medication Sig Dispense Refill  . albuterol (PROVENTIL HFA;VENTOLIN HFA) 108 (90 BASE) MCG/ACT inhaler Inhale 1-2 puffs into the lungs every 6 (six) hours as needed for wheezing or shortness of breath. 1 Inhaler 0  . citalopram (CELEXA) 40 MG tablet Take 1 tablet (40 mg total) by mouth daily. 30 tablet 6  . Melatonin 3 MG CAPS Take 3 mg by mouth at bedtime as needed (for sleep).     Marland Kitchen PRESCRIPTION MEDICATION Spirax- menstrual cramps.    . rizatriptan (MAXALT) 10 MG tablet Take 1 tablet (10 mg total) by mouth as needed for migraine. May repeat in 2 hours if needed 10 tablet 3  . topiramate (TOPAMAX) 50 MG tablet Take 1 tablet (50 mg total) by mouth 2 (two) times daily. 60 tablet 3  . Vitamin D, Ergocalciferol, (DRISDOL) 50000 units CAPS capsule Take 1 capsule (50,000 Units total) by mouth every 7 (seven) days. 12 capsule 0   No current facility-administered medications on file prior to visit.    Allergies  Allergen Reactions  . Sulfa Antibiotics Other (See Comments)    Causes headache.  . Amoxicillin Hives and Rash    Has patient had a PCN reaction causing immediate rash, facial/tongue/throat swelling, SOB or lightheadedness with hypotension:Yes Has patient  had a PCN reaction causing severe rash involving mucus membranes or skin necrosis:Yes Has patient had a PCN reaction that required hospitalization:No Has patient had a PCN reaction occurring within the last 10 years:Yes If all of the above answers are "NO", then may proceed with Cephalosporin use.   Cathie Olden Rash    Family History  Problem Relation Age of Onset  . Stroke Mother   . Hypertension Mother   . Diabetes Father   . Hypertension Maternal Grandmother   . Dementia Maternal Grandmother   . Cancer Maternal Grandfather     bone  . Diabetes Paternal Grandmother   . Hypertension Paternal Grandmother   . Prostate cancer Paternal Grandfather   . Colon cancer Neg Hx   . Breast cancer Paternal Aunt   . Heart disease  Maternal Grandmother   . Heart disease Paternal Grandmother   . Colon polyps Neg Hx   . Esophageal cancer Neg Hx     BP 112/74 mmHg  Pulse 76  Temp(Src) 99 F (37.2 C) (Oral)  Wt 203 lb (92.08 kg)  SpO2 97%  LMP 08/06/2015  Review of Systems  HENT: Negative for sore throat.   Eyes: Negative for visual disturbance.  Cardiovascular: Negative for palpitations.  Endocrine: Positive for polyuria.  Genitourinary: Negative for dysuria.  Musculoskeletal: Negative for gait problem.  Skin: Negative for wound.  Neurological: Negative for syncope and numbness.   Denies n/v/sob.  She has lost weight.  She denies fever.  Depression is well-controlled.      Objective:   Physical Exam VS: see vs page GEN: no distress HEAD: head: no deformity eyes: no periorbital swelling, no proptosis external nose and ears are normal mouth: no lesion seen NECK: supple, thyroid is not enlarged CHEST WALL: no deformity LUNGS: clear to auscultation CV: reg rate and rhythm, no murmur MUSCULOSKELETAL: muscle bulk and strength are grossly normal.  no obvious joint swelling.  gait is normal and steady EXTEMITIES: no deformity.  no ulcer on the feet.  feet are of normal color and temp.  no edema PULSES: dorsalis pedis intact bilat.   NEURO:  cn 2-12 grossly intact.   readily moves all 4's.  sensation is intact to touch on the feet SKIN:  Normal texture and temperature.  No rash or suspicious lesion is visible.   NODES:  None palpable at the neck PSYCH: alert.  Does not appear anxious nor depressed.    A1c=11.3%    Assessment & Plan:  DM: severe exacerbation.   Noncompliance with cbg recording and insulin, worse: I'll work around this as best I can.   Weight loss, due to severe hyperglycemia.  We'll follow.   Patient is advised the following: Patient Instructions  check your blood sugar twice a day.  vary the time of day when you check, between before the 3 meals, and at bedtime.  also check if you  have symptoms of your blood sugar being too high or too low.  please keep a record of the readings and bring it to your next appointment here.  You can write it on any piece of paper.  please call us sooner if your blood sugar goes below 70, or if you have a lot of readings over 200.   In view of your medical condition, you should avoid pregnancy until we have decided it is safe.   blood tests are requested for you today.  We'll let you know about the results. i have resent a prescription to your  pharmacy.  Here is a discount card.  please call us in 3-4 days, to tell us how the blood sugar is doing.  Please come back for a follow-up appointment in 1-2 weeks.

## 2015-08-07 NOTE — Patient Instructions (Addendum)
check your blood sugar twice a day.  vary the time of day when you check, between before the 3 meals, and at bedtime.  also check if you have symptoms of your blood sugar being too high or too low.  please keep a record of the readings and bring it to your next appointment here.  You can write it on any piece of paper.  please call us sooner if your blood sugar goes below 70, or if you have a lot of readings over 200.   In view of your medical condition, you should avoid pregnancy until we have decided it is safe.   blood tests are requested for you today.  We'll let you know about the results. i have resent a prescription to your pharmacy.  Here is a discount card.  please call us in 3-4 days, to tell us how the blood sugar is doing.  Please come back for a follow-up appointment in 1-2 weeks.

## 2015-08-07 NOTE — Progress Notes (Signed)
Pre visit review using our clinic review tool, if applicable. No additional management support is needed unless otherwise documented below in the visit note. 

## 2015-08-12 ENCOUNTER — Telehealth: Payer: Self-pay | Admitting: Endocrinology

## 2015-08-12 NOTE — Telephone Encounter (Signed)
Patient stated that the one touch test strips are not covered by insurance anymore, but the Accu check is, call and let her know when she can come by the ofice and pick it up.

## 2015-08-12 NOTE — Telephone Encounter (Signed)
I contacted the pt and advised unfortunately we did not have any accu-chek meters at this time. Pt voiced understanding and stated she would purchase the meter at her pharmacy.

## 2015-08-18 ENCOUNTER — Ambulatory Visit: Payer: BLUE CROSS/BLUE SHIELD

## 2015-08-19 ENCOUNTER — Emergency Department (HOSPITAL_COMMUNITY)
Admission: EM | Admit: 2015-08-19 | Discharge: 2015-08-19 | Disposition: A | Discharge status: Home / Self Care | Payer: BLUE CROSS/BLUE SHIELD | Attending: Emergency Medicine | Admitting: Emergency Medicine

## 2015-08-19 ENCOUNTER — Emergency Department (HOSPITAL_COMMUNITY): Payer: BLUE CROSS/BLUE SHIELD

## 2015-08-19 ENCOUNTER — Encounter (HOSPITAL_COMMUNITY): Payer: Self-pay | Admitting: Emergency Medicine

## 2015-08-19 DIAGNOSIS — W0110XA Fall on same level from slipping, tripping and stumbling with subsequent striking against unspecified object, initial encounter: Secondary | ICD-10-CM | POA: Insufficient documentation

## 2015-08-19 DIAGNOSIS — Z79899 Other long term (current) drug therapy: Secondary | ICD-10-CM | POA: Diagnosis not present

## 2015-08-19 DIAGNOSIS — Y999 Unspecified external cause status: Secondary | ICD-10-CM | POA: Diagnosis not present

## 2015-08-19 DIAGNOSIS — Z87891 Personal history of nicotine dependence: Secondary | ICD-10-CM | POA: Insufficient documentation

## 2015-08-19 DIAGNOSIS — Z794 Long term (current) use of insulin: Secondary | ICD-10-CM | POA: Insufficient documentation

## 2015-08-19 DIAGNOSIS — Y929 Unspecified place or not applicable: Secondary | ICD-10-CM | POA: Insufficient documentation

## 2015-08-19 DIAGNOSIS — E119 Type 2 diabetes mellitus without complications: Secondary | ICD-10-CM | POA: Insufficient documentation

## 2015-08-19 DIAGNOSIS — J45909 Unspecified asthma, uncomplicated: Secondary | ICD-10-CM | POA: Diagnosis not present

## 2015-08-19 DIAGNOSIS — S0990XA Unspecified injury of head, initial encounter: Secondary | ICD-10-CM | POA: Diagnosis not present

## 2015-08-19 DIAGNOSIS — Y9389 Activity, other specified: Secondary | ICD-10-CM | POA: Insufficient documentation

## 2015-08-19 MED ORDER — DIPHENHYDRAMINE HCL 50 MG/ML IJ SOLN
25.0000 mg | Freq: Once | INTRAMUSCULAR | Status: AC
  Administered 2015-08-19: 25 mg via INTRAMUSCULAR
  Filled 2015-08-19: qty 1

## 2015-08-19 MED ORDER — METOCLOPRAMIDE HCL 5 MG/ML IJ SOLN
10.0000 mg | Freq: Once | INTRAMUSCULAR | Status: AC
  Administered 2015-08-19: 10 mg via INTRAMUSCULAR
  Filled 2015-08-19: qty 2

## 2015-08-19 NOTE — ED Notes (Addendum)
Patient presents for fall, c/o HA, denies LOC, no anticoagulants, no double or blurred vision. No relief with prescription migraine medications. Rates pain 7/10. A&O x4, ambulatory with steady gait.

## 2015-08-19 NOTE — ED Provider Notes (Signed)
CSN: [id number]     Arrival date & time 08/19/15  1806 History   First MD Initiated Contact with Patient 08/19/15 1813     No chief complaint on file.    (Consider location/radiation/quality/duration/timing/severity/associated sxs/prior Treatment) HPI Comments: Patient here complaining of bitemporal headache that started after she had a fall and struck her head while jogging. Denied loss of consciousness. Complains of bilateral neck pain without numbness or tingling going down her arms. Does not have any diplopia. No chest or abdominal discomfort. Does have an abrasion to her right forearm. Her headache is been described as persistent and throbbing. Nothing makes it better. No treatment use prior to arrival  The history is provided by the patient.    Past Medical History  Diagnosis Date  . Diabetes mellitus without complication ([place])   . Asthma   . GERD (gastroesophageal reflux disease)   . Anemia    Past Surgical History  Procedure Laterality Date  . Cervical biopsy  w/ loop electrode excision    . Whipple procedure  2008  . Cervical tumor removed    . Dilation and curettage of uterus  2012  . Leep    . Robot assisted myomectomy N/A 08/23/2013    Procedure: ROBOTIC ASSISTED MYOMECTOMY;  Surgeon: Lahoma Crocker, MD;  Location: [facility];  Service: Gynecology;  Laterality: N/A;   Family History  Problem Relation Age of Onset  . Stroke Mother   . Hypertension Mother   . Diabetes Father   . Hypertension Maternal Grandmother   . Dementia Maternal Grandmother   . Cancer Maternal Grandfather     bone  . Diabetes Paternal Grandmother   . Hypertension Paternal Grandmother   . Prostate cancer Paternal Grandfather   . Colon cancer Neg Hx   . Breast cancer Paternal Aunt   . Heart disease Maternal Grandmother   . Heart disease Paternal Grandmother   . Colon polyps Neg Hx   . Esophageal cancer Neg Hx    Social History  Substance Use Topics  . Smoking status: Former Smoker   Types: Cigarettes    Quit date: 03/21/2006  . Smokeless tobacco: Never Used  . Alcohol Use: No   OB History    Gravida Para Term Preterm AB TAB SAB Ectopic Multiple Living   4    4  4    0     Review of Systems  All other systems reviewed and are negative.     Allergies  Sulfa antibiotics; Amoxicillin; and Silver  Home Medications   Prior to Admission medications   Medication Sig Start Date End Date Taking? Authorizing Provider  albuterol (PROVENTIL HFA;VENTOLIN HFA) 108 (90 BASE) MCG/ACT inhaler Inhale 1-2 puffs into the lungs every 6 (six) hours as needed for wheezing or shortness of breath. 02/05/14   Elnora Morrison, MD  cholecalciferol (VITAMIN D) 1000 units tablet Take 1,000 Units by mouth daily.    Historical Provider, MD  citalopram (CELEXA) 40 MG tablet Take 1 tablet (40 mg total) by mouth daily. 01/30/15   Midge Minium, MD  ferrous sulfate 325 (65 FE) MG tablet Take 325 mg by mouth daily with breakfast.    Historical Provider, MD  glucose blood (ONE TOUCH ULTRA TEST) test strip 1 each by Other route 2 (two) times daily. And lancets 2/day. 08/07/15   Renato Shin, MD  Insulin Glargine (LANTUS SOLOSTAR) 100 UNIT/ML Solostar Pen Inject 30 Units into the skin daily. And pen needles 1/day 08/07/15   Renato Shin, MD  Insulin Pen Needle 31G X 5 MM MISC Use to inject insulin 1 time per day. 08/07/15   Renato Shin, MD  Melatonin 3 MG CAPS Take 3 mg by mouth at bedtime as needed (for sleep).     Historical Provider, MD  Prenatal Vit-Fe Fumarate-FA (MULTIVITAMIN-PRENATAL) 27-0.8 MG TABS tablet Take 1 tablet by mouth daily at 12 noon.    Historical Provider, MD  PRESCRIPTION MEDICATION Spirax- menstrual cramps.    Historical Provider, MD  rizatriptan (MAXALT) 10 MG tablet Take 1 tablet (10 mg total) by mouth as needed for migraine. May repeat in 2 hours if needed 06/10/15   Midge Minium, MD  topiramate (TOPAMAX) 50 MG tablet Take 1 tablet (50 mg total) by mouth 2 (two) times  daily. 06/11/15   Midge Minium, MD  Vitamin D, Ergocalciferol, (DRISDOL) 50000 units CAPS capsule Take 1 capsule (50,000 Units total) by mouth every 7 (seven) days. 08/03/15   Midge Minium, MD   BP 104/68 mmHg  Pulse 65  Temp(Src) 98.9 F (37.2 C) (Oral)  Resp 18  Ht 5\' 10"  (1.778 m)  Wt 92.08 kg  BMI 29.13 kg/m2  SpO2 100%  LMP 08/06/2015 Physical Exam  Constitutional: She is oriented to person, place, and time. She appears well-developed and well-nourished.  Non-toxic appearance. No distress.  HENT:  Head: Normocephalic and atraumatic.  Eyes: Conjunctivae, EOM and lids are normal. Pupils are equal, round, and reactive to light.  Neck: Normal range of motion. Neck supple. No tracheal deviation present. No thyroid mass present.    Cardiovascular: Normal rate, regular rhythm and normal heart sounds.  Exam reveals no gallop.   No murmur heard. Pulmonary/Chest: Effort normal and breath sounds normal. No stridor. No respiratory distress. She has no decreased breath sounds. She has no wheezes. She has no rhonchi. She has no rales.  Abdominal: Soft. Normal appearance and bowel sounds are normal. She exhibits no distension. There is no tenderness. There is no rebound and no CVA tenderness.  Musculoskeletal: Normal range of motion. She exhibits no edema or tenderness.       Arms: Neurological: She is alert and oriented to person, place, and time. She has normal strength. No cranial nerve deficit or sensory deficit. GCS eye subscore is 4. GCS verbal subscore is 5. GCS motor subscore is 6.  Skin: Skin is warm and dry. No abrasion and no rash noted.  Psychiatric: She has a normal mood and affect. Her speech is normal and behavior is normal.  Nursing note and vitals reviewed.   ED Course  Procedures (including critical care time) Labs Review Labs Reviewed - No data to display  Imaging Review No results found. I have personally reviewed and evaluated these images and lab results  as part of my medical decision-making.   EKG Interpretation None      MDM   Final diagnoses:  None    Patient given Reglan and Benadryl for headache and feels better. No evidence of intracranial trauma or cervical spine fracture. Patient stable for discharge    Lacretia Leigh, MD 08/19/15 [phone]

## 2015-08-19 NOTE — Discharge Instructions (Signed)

## 2015-08-19 NOTE — ED Notes (Signed)
Patient was alert, oriented and stable upon discharge. RN went over AVS and patient had no further questions.  

## 2015-08-20 LAB — HM PAP SMEAR

## 2015-08-21 ENCOUNTER — Ambulatory Visit (INDEPENDENT_AMBULATORY_CARE_PROVIDER_SITE_OTHER): Payer: BLUE CROSS/BLUE SHIELD | Admitting: Endocrinology

## 2015-08-21 ENCOUNTER — Encounter: Payer: Self-pay | Admitting: Endocrinology

## 2015-08-21 VITALS — BP 114/60 | HR 71 | Wt 209.0 lb

## 2015-08-21 DIAGNOSIS — IMO0002 Reserved for concepts with insufficient information to code with codable children: Secondary | ICD-10-CM

## 2015-08-21 DIAGNOSIS — E1065 Type 1 diabetes mellitus with hyperglycemia: Secondary | ICD-10-CM

## 2015-08-21 DIAGNOSIS — E108 Type 1 diabetes mellitus with unspecified complications: Secondary | ICD-10-CM | POA: Diagnosis not present

## 2015-08-21 MED ORDER — GLUCOSE BLOOD VI STRP: 1.0000 | ORAL_STRIP | Freq: Two times a day (BID) | Status: DC

## 2015-08-21 MED ORDER — ACCU-CHEK AVIVA DEVI: 1.0000 | Freq: Once | Status: DC

## 2015-08-21 NOTE — Progress Notes (Signed)
Subjective:    Patient ID: Kristina Neal, female    DOB: [birth year], 40 y.o.   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   Dx'ed: [id number] Complications: none Therapy: insulin since 2011, when she had DKA.   GDM: never.  DKA: twice (2011 and 2014).  Severe hypoglycemia: never. Pancreatitis: never.   Other: due to noncompliance, she is on a QD insulin schedule.  She works 3rd shift, in [employer], educational.  She stopped V-GO pump, due to cost.   Interval history: Pt says she now takes the insulin as rx'ed, but she does not check cbg's.  She says ins prefers accucheck.   Past Medical History  Diagnosis Date  . Diabetes mellitus without complication ([place])   . Asthma   . GERD (gastroesophageal reflux disease)   . Anemia     Past Surgical History  Procedure Laterality Date  . Cervical biopsy  w/ loop electrode excision    . Whipple procedure  2008  . Cervical tumor removed    . Dilation and curettage of uterus  2012  . Leep    . Robot assisted myomectomy N/A 08/23/2013    Procedure: ROBOTIC ASSISTED MYOMECTOMY;  Surgeon: Lahoma Crocker, MD;  Location: [facility];  Service: Gynecology;  Laterality: N/A;    Social History   Social History  . Marital Status: Legally Separated    Spouse Name: N/A  . Number of Children: 0  . Years of Education: N/A   Occupational History  . CSR    Social History Main Topics  . Smoking status: Former Smoker    Types: Cigarettes    Quit date: 03/21/2006  . Smokeless tobacco: Never Used  . Alcohol Use: No  . Drug Use: No  . Sexual Activity:    Partners: Male    Birth Control/ Protection: None   Other Topics Concern  . Not on file   Social History Narrative    Current Outpatient Prescriptions on File Prior to Visit  Medication Sig Dispense Refill  . albuterol (PROVENTIL HFA;VENTOLIN HFA) 108 (90 BASE) MCG/ACT inhaler Inhale 1-2  puffs into the lungs every 6 (six) hours as needed for wheezing or shortness of breath. 1 Inhaler 0  . cholecalciferol (VITAMIN D) 1000 units tablet Take 1,000 Units by mouth daily.    . citalopram (CELEXA) 40 MG tablet Take 1 tablet (40 mg total) by mouth daily. 30 tablet 6  . ferrous sulfate 325 (65 FE) MG tablet Take 325 mg by mouth daily with breakfast.    . Insulin Glargine (LANTUS SOLOSTAR) 100 UNIT/ML Solostar Pen Inject 30 Units into the skin daily. And pen needles 1/day 15 mL 11  . Insulin Pen Needle 31G X 5 MM MISC Use to inject insulin 1 time per day. 100 each 2  . Melatonin 3 MG CAPS Take 3 mg by mouth at bedtime as needed (for sleep).     . Prenatal Vit-Fe Fumarate-FA (MULTIVITAMIN-PRENATAL) 27-0.8 MG TABS tablet Take 1 tablet by mouth daily at 12 noon.    Marland Kitchen PRESCRIPTION MEDICATION Spirax- menstrual cramps.    . rizatriptan (MAXALT) 10 MG tablet Take 1 tablet (10 mg total) by mouth as needed for migraine. May repeat in 2 hours if needed 10 tablet 3  . topiramate (TOPAMAX) 50 MG tablet Take 1 tablet (50 mg total) by mouth 2 (two) times daily. 60 tablet 3  .  Vitamin D, Ergocalciferol, (DRISDOL) 50000 units CAPS capsule Take 1 capsule (50,000 Units total) by mouth every 7 (seven) days. 12 capsule 0   No current facility-administered medications on file prior to visit.    Allergies  Allergen Reactions  . Sulfa Antibiotics Other (See Comments)    Causes headache.  . Amoxicillin Hives and Rash    Has patient had a PCN reaction causing immediate rash, facial/tongue/throat swelling, SOB or lightheadedness with hypotension:Yes Has patient had a PCN reaction causing severe rash involving mucus membranes or skin necrosis:Yes Has patient had a PCN reaction that required hospitalization:No Has patient had a PCN reaction occurring within the last 10 years:Yes If all of the above answers are "NO", then may proceed with Cephalosporin use.   Cathie Olden Rash    Family History  Problem  Relation Age of Onset  . Stroke Mother   . Hypertension Mother   . Diabetes Father   . Hypertension Maternal Grandmother   . Dementia Maternal Grandmother   . Cancer Maternal Grandfather     bone  . Diabetes Paternal Grandmother   . Hypertension Paternal Grandmother   . Prostate cancer Paternal Grandfather   . Colon cancer Neg Hx   . Breast cancer Paternal Aunt   . Heart disease Maternal Grandmother   . Heart disease Paternal Grandmother   . Colon polyps Neg Hx   . Esophageal cancer Neg Hx     BP 114/60 mmHg  Pulse 71  Wt 209 lb (94.802 kg)  SpO2 96%  LMP 08/06/2015    Review of Systems She denies hypoglycemia    Objective:   Physical Exam VITAL SIGNS:  See vs page GENERAL: no distress Pulses: dorsalis pedis intact bilat.   MSK: no deformity of the feet CV: no leg edema Skin:  no ulcer on the feet.  normal color and temp on the feet. Neuro: sensation is intact to touch on the feet       Assessment & Plan:  Type 1 DM: therapy limited by ongoing noncompliance.    Patient is advised the following: Patient Instructions  check your blood sugar twice a day.  vary the time of day when you check, between before the 3 meals, and at bedtime.  also check if you have symptoms of your blood sugar being too high or too low.  please keep a record of the readings and bring it to your next appointment here.  You can write it on any piece of paper.  please call us sooner if your blood sugar goes below 70, or if you have a lot of readings over 200.   In view of your medical condition, you should avoid pregnancy until we have decided it is safe.   i have resent prescriptions to your pharmacy, for a new meter and strips.   please call us next week, to tell us how the blood sugar is doing.  Please come back for a follow-up appointment in 6 weeks.        Renato Shin, MD

## 2015-08-21 NOTE — Patient Instructions (Addendum)
check your blood sugar twice a day.  vary the time of day when you check, between before the 3 meals, and at bedtime.  also check if you have symptoms of your blood sugar being too high or too low.  please keep a record of the readings and bring it to your next appointment here.  You can write it on any piece of paper.  please call us sooner if your blood sugar goes below 70, or if you have a lot of readings over 200.   In view of your medical condition, you should avoid pregnancy until we have decided it is safe.   i have resent prescriptions to your pharmacy, for a new meter and strips.   please call us next week, to tell us how the blood sugar is doing.  Please come back for a follow-up appointment in 6 weeks.

## 2015-08-24 ENCOUNTER — Encounter: Payer: Self-pay | Admitting: General Practice

## 2015-09-09 ENCOUNTER — Ambulatory Visit
Admission: RE | Admit: 2015-09-09 | Discharge: 2015-09-09 | Disposition: A | Discharge status: Home / Self Care | Payer: BLUE CROSS/BLUE SHIELD | Source: Ambulatory Visit | Attending: Family Medicine | Admitting: Family Medicine

## 2015-09-09 DIAGNOSIS — Z1231 Encounter for screening mammogram for malignant neoplasm of breast: Secondary | ICD-10-CM

## 2015-10-02 ENCOUNTER — Encounter: Payer: Self-pay | Admitting: Endocrinology

## 2015-10-02 ENCOUNTER — Ambulatory Visit (INDEPENDENT_AMBULATORY_CARE_PROVIDER_SITE_OTHER): Payer: BLUE CROSS/BLUE SHIELD | Admitting: Endocrinology

## 2015-10-02 VITALS — BP 106/70 | HR 90 | Ht 70.0 in | Wt 212.0 lb

## 2015-10-02 DIAGNOSIS — E108 Type 1 diabetes mellitus with unspecified complications: Secondary | ICD-10-CM | POA: Diagnosis not present

## 2015-10-02 DIAGNOSIS — E1065 Type 1 diabetes mellitus with hyperglycemia: Secondary | ICD-10-CM

## 2015-10-02 DIAGNOSIS — IMO0002 Reserved for concepts with insufficient information to code with codable children: Secondary | ICD-10-CM

## 2015-10-02 NOTE — Progress Notes (Signed)
Subjective:    Patient ID: Kristina Neal, female    DOB: [birth year], 39 y.o.   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   Dx'ed: [id number] Complications: none Therapy: insulin since 2011, when she had DKA.   GDM: never.  DKA: twice (2011 and 2014).  Severe hypoglycemia: never. Pancreatitis: never.   Other: due to noncompliance, she is on a QD insulin schedule.  She now works 1st shift, in [employer], educational.  She stopped V-GO pump, due to cost.   Interval history: Pt says she now never misses the insulin.  no cbg record, but she reports frequent mild hypoglycemia before lunch (approx 60), on work days only.  She says it is highest after breakfast (200's). Past Medical History  Diagnosis Date  . Diabetes mellitus without complication ([place])   . Asthma   . GERD (gastroesophageal reflux disease)   . Anemia     Past Surgical History  Procedure Laterality Date  . Cervical biopsy  w/ loop electrode excision    . Whipple procedure  2008  . Cervical tumor removed    . Dilation and curettage of uterus  2012  . Leep    . Robot assisted myomectomy N/A 08/23/2013    Procedure: ROBOTIC ASSISTED MYOMECTOMY;  Surgeon: Lahoma Crocker, MD;  Location: [facility];  Service: Gynecology;  Laterality: N/A;    Social History   Social History  . Marital Status: Legally Separated    Spouse Name: N/A  . Number of Children: 0  . Years of Education: N/A   Occupational History  . CSR    Social History Main Topics  . Smoking status: Former Smoker    Types: Cigarettes    Quit date: 03/21/2006  . Smokeless tobacco: Never Used  . Alcohol Use: No  . Drug Use: No  . Sexual Activity:    Partners: Male    Birth Control/ Protection: None   Other Topics Concern  . Not on file   Social History Narrative    Current Outpatient Prescriptions on File Prior to Visit  Medication Sig Dispense  Refill  . albuterol (PROVENTIL HFA;VENTOLIN HFA) 108 (90 BASE) MCG/ACT inhaler Inhale 1-2 puffs into the lungs every 6 (six) hours as needed for wheezing or shortness of breath. 1 Inhaler 0  . Blood Glucose Monitoring Suppl (ACCU-CHEK AVIVA) device 1 each by Other route once. Use as instructed 1 each 0  . cholecalciferol (VITAMIN D) 1000 units tablet Take 1,000 Units by mouth daily.    . citalopram (CELEXA) 40 MG tablet Take 1 tablet (40 mg total) by mouth daily. 30 tablet 6  . ferrous sulfate 325 (65 FE) MG tablet Take 325 mg by mouth daily with breakfast.    . glucose blood test strip 1 each by Other route 2 (two) times daily. And lancets 2/day 100 each 12  . Insulin Glargine (LANTUS SOLOSTAR) 100 UNIT/ML Solostar Pen Inject 30 Units into the skin daily. And pen needles 1/day 15 mL 11  . Insulin Pen Needle 31G X 5 MM MISC Use to inject insulin 1 time per day. 100 each 2  . Melatonin 3 MG CAPS Take 3 mg by mouth at bedtime as needed (for sleep).     . Prenatal Vit-Fe Fumarate-FA (MULTIVITAMIN-PRENATAL) 27-0.8 MG TABS tablet Take 1 tablet by mouth daily at 12 noon.    Marland Kitchen PRESCRIPTION MEDICATION Spirax- menstrual cramps.    Marland Kitchen  rizatriptan (MAXALT) 10 MG tablet Take 1 tablet (10 mg total) by mouth as needed for migraine. May repeat in 2 hours if needed 10 tablet 3  . topiramate (TOPAMAX) 50 MG tablet Take 1 tablet (50 mg total) by mouth 2 (two) times daily. 60 tablet 3  . Vitamin D, Ergocalciferol, (DRISDOL) 50000 units CAPS capsule Take 1 capsule (50,000 Units total) by mouth every 7 (seven) days. 12 capsule 0   No current facility-administered medications on file prior to visit.    Allergies  Allergen Reactions  . Sulfa Antibiotics Other (See Comments)    Causes headache.  . Amoxicillin Hives and Rash    Has patient had a PCN reaction causing immediate rash, facial/tongue/throat swelling, SOB or lightheadedness with hypotension:Yes Has patient had a PCN reaction causing severe rash involving  mucus membranes or skin necrosis:Yes Has patient had a PCN reaction that required hospitalization:No Has patient had a PCN reaction occurring within the last 10 years:Yes If all of the above answers are "NO", then may proceed with Cephalosporin use.   Cathie Olden Rash    Family History  Problem Relation Age of Onset  . Stroke Mother   . Hypertension Mother   . Diabetes Father   . Hypertension Maternal Grandmother   . Dementia Maternal Grandmother   . Cancer Maternal Grandfather     bone  . Diabetes Paternal Grandmother   . Hypertension Paternal Grandmother   . Prostate cancer Paternal Grandfather   . Colon cancer Neg Hx   . Breast cancer Paternal Aunt   . Heart disease Maternal Grandmother   . Heart disease Paternal Grandmother   . Colon polyps Neg Hx   . Esophageal cancer Neg Hx     BP 106/70 mmHg  Pulse 90  Ht 5\' 10"  (1.778 m)  Wt 212 lb (96.163 kg)  BMI 30.42 kg/m2  SpO2 98%  LMP 09/07/2015  Review of Systems Denies LOC.    Objective:   Physical Exam VITAL SIGNS:  See vs page GENERAL: no distress Pulses: dorsalis pedis intact bilat.   MSK: no deformity of the feet CV: no leg edema Skin:  no ulcer on the feet.  normal color and temp on the feet. Neuro: sensation is intact to touch on the feet.        Assessment & Plan:  Insulin-requiring type 2 DM: uncertain control.  Check fructosamine.  Patient is advised the following: Patient Instructions  check your blood sugar twice a day.  vary the time of day when you check, between before the 3 meals, and at bedtime.  also check if you have symptoms of your blood sugar being too high or too low.  please keep a record of the readings and bring it to your next appointment here.  You can write it on any piece of paper.  please call us sooner if your blood sugar goes below 70, or if you have a lot of readings over 200.   In view of your medical condition, you should avoid pregnancy until we have decided it is safe.       Please come back for a follow-up appointment in 2 months.    blood tests are requested for you today.  We'll let you know about the results. If it is good, you should start taking less insulin on work days If it is high, we'll change the insulin to bedtime.  Renato Shin, MD

## 2015-10-02 NOTE — Patient Instructions (Addendum)
check your blood sugar twice a day.  vary the time of day when you check, between before the 3 meals, and at bedtime.  also check if you have symptoms of your blood sugar being too high or too low.  please keep a record of the readings and bring it to your next appointment here.  You can write it on any piece of paper.  please call us sooner if your blood sugar goes below 70, or if you have a lot of readings over 200.   In view of your medical condition, you should avoid pregnancy until we have decided it is safe.     Please come back for a follow-up appointment in 2 months.    blood tests are requested for you today.  We'll let you know about the results. If it is good, you should start taking less insulin on work days If it is high, we'll change the insulin to bedtime.

## 2015-10-08 LAB — FRUCTOSAMINE: Fructosamine: 343 umol/L — ABNORMAL HIGH (ref 190–270)

## 2015-10-15 ENCOUNTER — Other Ambulatory Visit: Payer: Self-pay | Admitting: Family Medicine

## 2015-10-27 ENCOUNTER — Other Ambulatory Visit: Payer: Self-pay | Admitting: Family Medicine

## 2015-10-29 ENCOUNTER — Telehealth: Payer: Self-pay | Admitting: Emergency Medicine

## 2015-10-29 ENCOUNTER — Encounter: Payer: Self-pay | Admitting: Family Medicine

## 2015-10-29 ENCOUNTER — Ambulatory Visit (HOSPITAL_BASED_OUTPATIENT_CLINIC_OR_DEPARTMENT_OTHER)
Admission: RE | Admit: 2015-10-29 | Discharge: 2015-10-29 | Disposition: A | Discharge status: Home / Self Care | Payer: BLUE CROSS/BLUE SHIELD | Source: Ambulatory Visit | Attending: Family Medicine | Admitting: Family Medicine

## 2015-10-29 ENCOUNTER — Ambulatory Visit (INDEPENDENT_AMBULATORY_CARE_PROVIDER_SITE_OTHER): Payer: BLUE CROSS/BLUE SHIELD | Admitting: Family Medicine

## 2015-10-29 VITALS — BP 110/71 | HR 69 | Temp 98.8°F | Resp 16 | Ht 70.0 in | Wt 215.5 lb

## 2015-10-29 DIAGNOSIS — M79604 Pain in right leg: Secondary | ICD-10-CM

## 2015-10-29 DIAGNOSIS — M79661 Pain in right lower leg: Secondary | ICD-10-CM

## 2015-10-29 DIAGNOSIS — M7989 Other specified soft tissue disorders: Secondary | ICD-10-CM | POA: Diagnosis not present

## 2015-10-29 NOTE — Patient Instructions (Signed)
We'll notify you of your doppler results as soon as they are available Apply heat to the area to help reabsorb the swelling and provide pain relief Call with any questions or concerns Hang in there!!!

## 2015-10-29 NOTE — Telephone Encounter (Signed)
Imaging calling with results for patient right leg ultrasound. Results is negative for DVT. Dr. Birdie Riddle was made aware of negative results. Per Dr. Birdie Riddle to continue warm compresses to the area and will slowly resolve. If the bruise does not improve after 7-14 days to contact the office.

## 2015-10-29 NOTE — Progress Notes (Signed)
   Subjective:    Patient ID: Kristina Neal, female    DOB: [birth year], 40 y.o.   MRN: [id number]  HPI Large bruise- R posterolateral calf.  First noted 7/27.  Initially 'really really ugly and hurt really really bad'.  Pt reports area was hard to touch.  Pt feels like she hit her leg on something (probably a pew) while at church but doesn't really recall.  No bruising elsewhere that's concerning.  Not on OCPs, not a smoker.   Review of Systems For ROS see HPI     Objective:   Physical Exam  Constitutional: She is oriented to person, place, and time. She appears well-developed and well-nourished. No distress.  HENT:  Head: Normocephalic and atraumatic.  Cardiovascular: Intact distal pulses.   Pulmonary/Chest: Effort normal. No respiratory distress.  Musculoskeletal: She exhibits edema (swelling over R posterolateral lower leg w/ TTP) and tenderness.  Neurological: She is alert and oriented to person, place, and time.  Skin: Skin is warm and dry.  Vitals reviewed.         Assessment & Plan:  R lower leg pain and swelling- area appears to be consistent w/ hematoma but due to swelling and pain out of proportion to possible injury, will need to r/o DVT w/ doppler.  Pt to apply heat to help w/ swelling and reabsorption of blood.  Reviewed supportive care and red flags that should prompt return.  Pt expressed understanding and is in agreement w/ plan.

## 2015-10-29 NOTE — Progress Notes (Signed)
Pre visit review using our clinic review tool, if applicable. No additional management support is needed unless otherwise documented below in the visit note. 

## 2015-11-13 ENCOUNTER — Other Ambulatory Visit: Payer: Self-pay | Admitting: Family Medicine

## 2015-11-24 ENCOUNTER — Ambulatory Visit (INDEPENDENT_AMBULATORY_CARE_PROVIDER_SITE_OTHER): Payer: BLUE CROSS/BLUE SHIELD | Admitting: Gastroenterology

## 2015-11-24 ENCOUNTER — Encounter: Payer: Self-pay | Admitting: Gastroenterology

## 2015-11-24 VITALS — BP 124/76 | HR 72 | Ht 69.0 in | Wt 215.4 lb

## 2015-11-24 DIAGNOSIS — IMO0002 Reserved for concepts with insufficient information to code with codable children: Secondary | ICD-10-CM

## 2015-11-24 DIAGNOSIS — K909 Intestinal malabsorption, unspecified: Secondary | ICD-10-CM | POA: Diagnosis not present

## 2015-11-24 DIAGNOSIS — E1065 Type 1 diabetes mellitus with hyperglycemia: Secondary | ICD-10-CM

## 2015-11-24 DIAGNOSIS — E108 Type 1 diabetes mellitus with unspecified complications: Secondary | ICD-10-CM

## 2015-11-24 DIAGNOSIS — R197 Diarrhea, unspecified: Secondary | ICD-10-CM

## 2015-11-24 DIAGNOSIS — K8689 Other specified diseases of pancreas: Secondary | ICD-10-CM

## 2015-11-24 MED ORDER — PANCRELIPASE (LIP-PROT-AMYL) 40000-136000 UNITS PO CPEP: 2.0000 | ORAL_CAPSULE | Freq: Three times a day (TID) | ORAL | 11 refills | Status: DC

## 2015-11-24 NOTE — Progress Notes (Signed)
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 She ran out of pancreatic enzymes sometime ago and consequently lost  lot of weight and developed steatorrhea. . Her gastric emptying scan to evaluate early satiety is essentially normal. She has been on iron supplements for chronic iron deficiency.. Her last upper endoscopy in November 2008 showed pancreatico- duodenostomy and duoden0- jejunal anastomosis distal to the pylorus. She had a pyloric sparing procedure. Since her last appointment on 01/14/2014, she has gained 12 pounds from 188 to a current 200 pounds. Her diarrhea has resolved and she is feeling fine. She has constant gurgling in the stomach and occasional g-e  reflux. She is asking about her pancreatic stent which was placed several years ago in her pancreas and it is not known whether it is still there."  Kristina Neal reports that due to variable employment, she has been on and off her meds for the last 18 months. She now has steady employment with insurance, and has been able to consistently take her insulin, but has not had a prescription for reticulocyte enzymes. Unfortunately, she did not call to request that medication either. As a result, she has had some intermittent abdominal cramps, but stool is always loose and sometimes oily. Her weight has been up and down by her report. Dr. Nichola Neal note also indicates there was question of a persistent pancreatic stent after previous surgery. There was a plan MRCP, but Kristina Neal says that her insurance would not cover it. She was in the ED May 2016 for lower abdominal pain that turned out to be  from fibroids. The report from this study does not indicate any stent seen in the pancreas.  ROS: Cardiovascular:  no chest pain Respiratory: no dyspnea Remainder 12 point systems are negative except as above in the history The patient's Past Medical, Family and Social History were reviewed and are on file in the EMR.  Objective:  Med list reviewed  Vital signs in last 24 hrs: Vitals:   11/24/15 0826  BP: 124/76  Pulse: 72    Physical Exam   HEENT: sclera anicteric, oral mucosa moist without lesions  Neck: supple, no thyromegaly, JVD or lymphadenopathy  Cardiac: RRR without murmurs, S1S2 heard, no peripheral edema  Pulm: clear to auscultation bilaterally, normal RR and effort noted  Abdomen: soft, Overweight, no tenderness, with active bowel sounds. No guarding or palpable hepatosplenomegaly.  Skin; warm and dry, no jaundice or rash  Recent Labs:  Hgb A1C 11.3  In May 2017  Radiologic studies:  CTAP May 2016, no panc stent seen.  @ASSESSMENTPLANBEGIN @ Assessment: Encounter Diagnoses  Name Primary?  . Pancreatic insufficiency ([place]) Yes  . Diarrhea due to malabsorption   . Uncontrolled type 1 diabetes mellitus with complication (HCC)     Pancreatic insufficiency and insulin requiring diabetes after previous partial pancreatectomy. She has symptoms of malabsorption since she has not been on pancreatic enzyme supplements.  Plan: Imodium 2 tablets 2-3 times a day as needed for diarrhea Zenpep 40,000 units, 2 capsules with meals and one with snacks Follow-up in 3 months or sooner as needed   Total time 25 minutes, over half spent in counseling and coordination of care.  Kristina Neal

## 2015-11-24 NOTE — Patient Instructions (Addendum)
I have ordered your pancreatic enzymes.   2 capsules with each meal and one with each snack.  Also,  Imodium (over the counter) 2 caplets 2-3 times per day as needed for diarrhea. Low fat diet  If you are age 40 or older, your body mass index should be between 23-30. Your Body mass index is 31.81 kg/m. If this is out of the aforementioned range listed, please consider follow up with your Primary Care Provider.  If you are age 68 or younger, your body mass index should be between 19-25. Your Body mass index is 31.81 kg/m. If this is out of the aformentioned range listed, please consider follow up with your Primary Care Provider.   Thank you for choosing [facility]  Dr Wilfrid Lund III

## 2015-12-04 ENCOUNTER — Encounter: Payer: Self-pay | Admitting: Endocrinology

## 2015-12-04 ENCOUNTER — Ambulatory Visit (INDEPENDENT_AMBULATORY_CARE_PROVIDER_SITE_OTHER): Payer: BLUE CROSS/BLUE SHIELD | Admitting: Endocrinology

## 2015-12-04 VITALS — BP 122/86 | HR 72 | Ht 69.0 in | Wt 223.0 lb

## 2015-12-04 DIAGNOSIS — E1065 Type 1 diabetes mellitus with hyperglycemia: Secondary | ICD-10-CM | POA: Diagnosis not present

## 2015-12-04 DIAGNOSIS — E108 Type 1 diabetes mellitus with unspecified complications: Secondary | ICD-10-CM

## 2015-12-04 DIAGNOSIS — IMO0002 Reserved for concepts with insufficient information to code with codable children: Secondary | ICD-10-CM

## 2015-12-04 LAB — POCT GLYCOSYLATED HEMOGLOBIN (HGB A1C): HEMOGLOBIN A1C: 8.5

## 2015-12-04 MED ORDER — INSULIN GLARGINE 100 UNIT/ML SOLOSTAR PEN: 32.0000 [IU] | PEN_INJECTOR | SUBCUTANEOUS | 11 refills | Status: DC

## 2015-12-04 NOTE — Progress Notes (Signed)
Subjective:    Patient ID: Kristina Neal, female    DOB: 04-12-1975, 40 y.o.   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   Dx'ed: [id number] Complications: none Therapy: insulin since 2011, when she had DKA.   GDM: never.  DKA: twice (2011 and 2014).  Severe hypoglycemia: never. Pancreatitis: never.   Other: due to noncompliance, she is on a QD insulin schedule.  She now works 1st shift, in [employer], [employer].  She stopped V-GO pump, due to cost.   Interval history: Pt says she now never misses the insulin.  no cbg record, but she says cbg varies from 60 up to the high-100's.  She says it is lowest at lunch.  She says cbg on work days is similar to days off.   Past Medical History:  Diagnosis Date  . Anemia   . Asthma   . Diabetes mellitus without complication ([place])   . GERD (gastroesophageal reflux disease)   . Pancreatic insufficiency ([place]) 2015    Past Surgical History:  Procedure Laterality Date  . CERVICAL BIOPSY  W/ LOOP ELECTRODE EXCISION    . cervical tumor removed    . DILATION AND CURETTAGE OF UTERUS  2012  . LEEP    . ROBOT ASSISTED MYOMECTOMY N/A 08/23/2013   Procedure: ROBOTIC ASSISTED MYOMECTOMY;  Surgeon: Lahoma Crocker, MD;  Location: [facility];  Service: Gynecology;  Laterality: N/A;  . WHIPPLE PROCEDURE  2008    Social History   Social History  . Marital status: Legally Separated    Spouse name: N/A  . Number of children: 0  . Years of education: N/A   Occupational History  . [employer]    Social History Main Topics  . Smoking status: Former Smoker    Types: Cigarettes    Quit date: 03/21/2006  . Smokeless tobacco: Never Used  . Alcohol use No  . Drug use: No  . Sexual activity: Yes    Partners: Male    Birth control/ protection: None   Other Topics Concern  . Not on file   Social History Narrative  . No narrative on file     Current Outpatient Prescriptions on File Prior to Visit  Medication Sig Dispense Refill  . albuterol (PROVENTIL HFA;VENTOLIN HFA) 108 (90 BASE) MCG/ACT inhaler Inhale 1-2 puffs into the lungs every 6 (six) hours as needed for wheezing or shortness of breath. 1 Inhaler 0  . Blood Glucose Monitoring Suppl (ACCU-CHEK AVIVA) device 1 each by Other route once. Use as instructed 1 each 0  . cholecalciferol (VITAMIN D) 1000 units tablet Take 1,000 Units by mouth daily.    . citalopram (CELEXA) 40 MG tablet TAKE 1 TABLET BY MOUTH EVERY DAY 30 tablet 3  . ferrous sulfate 325 (65 FE) MG tablet Take 325 mg by mouth daily with breakfast.    . glucose blood test strip 1 each by Other route 2 (two) times daily. And lancets 2/day 100 each 12  . Insulin Pen Needle 31G X 5 MM MISC Use to inject insulin 1 time per day. 100 each 2  . Melatonin 3 MG CAPS Take 3 mg by mouth at bedtime as needed (for sleep).     . Pancrelipase, Lip-Prot-Amyl, (ZENPEP) 40000 units CPEP Take 2 capsules by mouth 4 (four) times daily -  with meals and at bedtime. Dose is 2 capsules with meals and  1 capsule with snacks 100 capsule 11  . Prenatal Vit-Fe Fumarate-FA (MULTIVITAMIN-PRENATAL) 27-0.8 MG TABS tablet Take 1 tablet by mouth daily at 12 noon.    Marland Kitchen PRESCRIPTION MEDICATION Spirax- menstrual cramps.    . rizatriptan (MAXALT) 10 MG tablet Take 1 tablet (10 mg total) by mouth as needed for migraine. May repeat in 2 hours if needed 10 tablet 3  . topiramate (TOPAMAX) 50 MG tablet TAKE 1 TABLET BY MOUTH TWICE DAILY 60 tablet 0   No current facility-administered medications on file prior to visit.     Allergies  Allergen Reactions  . Sulfa Antibiotics Other (See Comments)    Causes headache.  . Amoxicillin Hives and Rash    Has patient had a PCN reaction causing immediate rash, facial/tongue/throat swelling, SOB or lightheadedness with hypotension:Yes Has patient had a PCN reaction causing severe rash involving mucus membranes or  skin necrosis:Yes Has patient had a PCN reaction that required hospitalization:No Has patient had a PCN reaction occurring within the last 10 years:Yes If all of the above answers are "NO", then may proceed with Cephalosporin use.   Cathie Olden Rash    Family History  Problem Relation Age of Onset  . Stroke Mother   . Hypertension Mother   . Diabetes Father   . Hypertension Maternal Grandmother   . Dementia Maternal Grandmother   . Heart disease Maternal Grandmother   . Bone cancer Maternal Grandfather   . Diabetes Paternal Grandmother   . Hypertension Paternal Grandmother   . Heart disease Paternal Grandmother   . Prostate cancer Paternal Grandfather   . Breast cancer Paternal Aunt   . Colon cancer Neg Hx   . Colon polyps Neg Hx   . Esophageal cancer Neg Hx     BP 122/86   Pulse 72   Ht 5\' 9"  (1.753 m)   Wt 223 lb (101.2 kg)   SpO2 98%   BMI 32.93 kg/m    Review of Systems She has weight gain.     Objective:   Physical Exam VITAL SIGNS:  See vs page GENERAL: no distress Pulses: dorsalis pedis intact bilat.   MSK: no deformity of the feet CV: no leg edema Skin:  no ulcer on the feet.  normal color and temp on the feet. Neuro: sensation is intact to touch on the feet  A1c=8.5%      Assessment & Plan:  Type 1 DM: she needs increased rx Pancreatectomy: in the setting, she needs small insulin adjustments Hypoglycemia: she should not miss meals

## 2015-12-04 NOTE — Patient Instructions (Addendum)
check your blood sugar twice a day.  vary the time of day when you check, between before the 3 meals, and at bedtime.  also check if you have symptoms of your blood sugar being too high or too low.  please keep a record of the readings and bring it to your next appointment here.  You can write it on any piece of paper.  please call us sooner if your blood sugar goes below 70, or if you have a lot of readings over 200.   In view of your medical condition, you should avoid pregnancy until we have decided it is safe Please increase the insulin to 32 units each morning On this type of insulin schedule, you should eat meals on a regular schedule.  If a meal is missed or significantly delayed, your blood sugar could go low.    Please come back for a follow-up appointment in 2 months.

## 2015-12-06 ENCOUNTER — Emergency Department (HOSPITAL_COMMUNITY)
Admission: EM | Admit: 2015-12-06 | Discharge: 2015-12-06 | Disposition: A | Discharge status: Home / Self Care | Payer: BLUE CROSS/BLUE SHIELD | Attending: Emergency Medicine | Admitting: Emergency Medicine

## 2015-12-06 DIAGNOSIS — J45909 Unspecified asthma, uncomplicated: Secondary | ICD-10-CM | POA: Diagnosis not present

## 2015-12-06 DIAGNOSIS — Z79899 Other long term (current) drug therapy: Secondary | ICD-10-CM | POA: Insufficient documentation

## 2015-12-06 DIAGNOSIS — E109 Type 1 diabetes mellitus without complications: Secondary | ICD-10-CM | POA: Insufficient documentation

## 2015-12-06 DIAGNOSIS — Z794 Long term (current) use of insulin: Secondary | ICD-10-CM | POA: Diagnosis not present

## 2015-12-06 DIAGNOSIS — J040 Acute laryngitis: Secondary | ICD-10-CM | POA: Diagnosis not present

## 2015-12-06 DIAGNOSIS — R509 Fever, unspecified: Secondary | ICD-10-CM | POA: Diagnosis present

## 2015-12-06 DIAGNOSIS — Z87891 Personal history of nicotine dependence: Secondary | ICD-10-CM | POA: Diagnosis not present

## 2015-12-06 LAB — CBG MONITORING, ED: GLUCOSE-CAPILLARY: 241 mg/dL — AB (ref 65–99)

## 2015-12-06 NOTE — ED Provider Notes (Signed)
[facility] DEPT Provider Note   CSN: [id number] Arrival date & time: 12/06/15  2011  By signing my name below, I, [name], attest that this documentation has been prepared under the direction and in the presence of [employer], PA-C. Electronically Signed: [name], ED Scribe. 12/06/15. 8:50 PM.  History   Chief Complaint Chief Complaint  Patient presents with  . Fever   The history is provided by the patient. No language interpreter was used.   HPI Comments: Kristina Neal is a 40 y.o. female with a hx of asthma and DM who presents to the Emergency Department complaining of a recurrent subjective fever onset 5 days ago. Pt reports associated mild sore throat, dry cough, rhinorrhea, congestion, and voice change beginning yesterday. She has been taking Theraflu and Mucinex for her symptoms, last dose was this morning. Pt works at a call center. She denies nausea or vomiting.   Past Medical History:  Diagnosis Date  . Anemia   . Asthma   . Diabetes mellitus without complication ([place])   . GERD (gastroesophageal reflux disease)   . Pancreatic insufficiency ([place]) 2015    Patient Active Problem List   Diagnosis Date Noted  . Overweight 01/30/2015  . Possible pregnancy 05/22/2014  . Acute maxillary sinusitis 04/28/2014  . Increased bowel frequency 02/21/2014  . Physical exam 01/22/2014  . Depression 01/22/2014  . Irregular menses 12/12/2013  . Ethmoid sinusitis 12/12/2013  . Migraine 12/12/2013  . DM I (diabetes mellitus, type I), uncontrolled ([place]) 10/03/2013  . Follow-up examination, following unspecified surgery 09/18/2013  . Fibroids, subserous 08/04/2013  . Abdominal pain, lower 08/04/2013  . 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 Routine gynecological examination 04/12/2013  . Candidal vulvovaginitis 04/12/2013    Past Surgical History:  Procedure Laterality Date  . CERVICAL  BIOPSY  W/ LOOP ELECTRODE EXCISION    . cervical tumor removed    . DILATION AND CURETTAGE OF UTERUS  2012  . LEEP    . ROBOT ASSISTED MYOMECTOMY N/A 08/23/2013   Procedure: ROBOTIC ASSISTED MYOMECTOMY;  Surgeon: Lahoma Crocker, MD;  Location: [facility];  Service: Gynecology;  Laterality: N/A;  . WHIPPLE PROCEDURE  2008    OB History    Gravida Para Term Preterm AB Living   4       4 0   SAB TAB Ectopic Multiple Live Births   4               Home Medications    Prior to Admission medications   Medication Sig Start Date End Date Taking? Authorizing Provider  albuterol (PROVENTIL HFA;VENTOLIN HFA) 108 (90 BASE) MCG/ACT inhaler Inhale 1-2 puffs into the lungs every 6 (six) hours as needed for wheezing or shortness of breath. 02/05/14   Elnora Morrison, MD  Blood Glucose Monitoring Suppl (ACCU-CHEK AVIVA) device 1 each by Other route once. Use as instructed 08/21/15   Renato Shin, MD  cholecalciferol (VITAMIN D) 1000 units tablet Take 1,000 Units by mouth daily.    Historical Provider, MD  citalopram (CELEXA) 40 MG tablet TAKE 1 TABLET BY MOUTH EVERY DAY 11/13/15   Midge Minium, MD  ferrous sulfate 325 (65 FE) MG tablet Take 325 mg by mouth daily with breakfast.    Historical Provider, MD  glucose blood test strip 1 each by Other route 2 (two) times daily. And lancets 2/day 08/21/15   Renato Shin, MD  Insulin Glargine (LANTUS SOLOSTAR) 100 UNIT/ML Solostar  Pen Inject 32 Units into the skin every morning. And pen needles 1/day 12/04/15   Renato Shin, MD  Insulin Pen Needle 31G X 5 MM MISC Use to inject insulin 1 time per day. 08/07/15   Renato Shin, MD  Melatonin 3 MG CAPS Take 3 mg by mouth at bedtime as needed (for sleep).     Historical Provider, MD  Pancrelipase, Lip-Prot-Amyl, (ZENPEP) 40000 units CPEP Take 2 capsules by mouth 4 (four) times daily -  with meals and at bedtime. Dose is 2 capsules with meals and 1 capsule with snacks 11/24/15   [name] III, MD  Prenatal Vit-Fe  Fumarate-FA (MULTIVITAMIN-PRENATAL) 27-0.8 MG TABS tablet Take 1 tablet by mouth daily at 12 noon.    Historical Provider, MD  PRESCRIPTION MEDICATION Spirax- menstrual cramps.    Historical Provider, MD  rizatriptan (MAXALT) 10 MG tablet Take 1 tablet (10 mg total) by mouth as needed for migraine. May repeat in 2 hours if needed 06/10/15   Midge Minium, MD  topiramate (TOPAMAX) 50 MG tablet TAKE 1 TABLET BY MOUTH TWICE DAILY 10/15/15   Midge Minium, MD    Family History Family History  Problem Relation Age of Onset  . Stroke Mother   . Hypertension Mother   . Diabetes Father   . Hypertension Maternal Grandmother   . Dementia Maternal Grandmother   . Heart disease Maternal Grandmother   . Bone cancer Maternal Grandfather   . Diabetes Paternal Grandmother   . Hypertension Paternal Grandmother   . Heart disease Paternal Grandmother   . Prostate cancer Paternal Grandfather   . Breast cancer Paternal Aunt   . Colon cancer Neg Hx   . Colon polyps Neg Hx   . Esophageal cancer Neg Hx     Social History Social History  Substance Use Topics  . Smoking status: Former Smoker    Types: Cigarettes    Quit date: 03/21/2006  . Smokeless tobacco: Never Used  . Alcohol use No     Allergies   Sulfa antibiotics; Amoxicillin; and Silver   Review of Systems Review of Systems  All other systems reviewed and are negative.    Physical Exam Updated Vital Signs BP 106/73 (BP Location: Right Arm)   Pulse 78   Temp 98.5 F (36.9 C) (Oral)   Resp 18   LMP 12/02/2015 (Approximate)   SpO2 99%   Physical Exam  Constitutional: She is oriented to person, place, and time. She appears well-developed and well-nourished.  HENT:  Head: Normocephalic and atraumatic.  Mouth/Throat: Oropharynx is clear and moist. No oropharyngeal exudate.  Eyes: EOM are normal.  Neck: Normal range of motion. Neck supple.  Cardiovascular: Normal rate.   Pulmonary/Chest: Effort normal.  Musculoskeletal:  Normal range of motion.  Neurological: She is alert and oriented to person, place, and time.  Skin: Skin is warm and dry.  Psychiatric: She has a normal mood and affect. Her behavior is normal.  Nursing note and vitals reviewed.   ED Treatments / Results  DIAGNOSTIC STUDIES: Oxygen Saturation is 99% on RA, normal by my interpretation.    COORDINATION OF CARE: 8:47 PM-Discussed treatment plan which includes symptomatic treatment with pt at bedside and pt agreed to plan.    Labs (all labs ordered are listed, but only abnormal results are displayed) Labs Reviewed  CBG MONITORING, ED - Abnormal; Notable for the following:       Result Value   Glucose-Capillary 241 (*)    All other  components within normal limits    EKG  EKG Interpretation None       Radiology No results found.  Procedures Procedures (including critical care time)  Medications Ordered in ED Medications - No data to display   Initial Impression / Assessment and Plan / ED Course  I have reviewed the triage vital signs and the nursing notes.  Pertinent labs & imaging results that were available during my care of the patient were reviewed by me and considered in my medical decision making (see chart for details).  Clinical Course   Final Clinical Impressions(s) / ED Diagnoses   Final diagnoses:  Laryngitis   Labs:  Imaging:  Consults:  Therapeutics:  Discharge Meds:   Assessment/Plan:  Patient's presentation most consistent with laryngitis. No difficulty swallowing or breathing, she has no signs of infectious etiology on my physical exam. Vital signs reassuring.. Discussed that antibiotics are not indicated for viral infections. Pt will be discharged with symptomatic treatment.  Verbalizes understanding and is agreeable with plan. Pt is hemodynamically stable & in NAD prior to dc.  New Prescriptions New Prescriptions   No medications on file     Okey Regal, PA-C 12/06/15 2059      Tanna Furry, MD 12/18/15 [phone]

## 2015-12-06 NOTE — ED Triage Notes (Signed)
Pt states that she has had a fever x 5 days and lost her voice yesterday. Afebrile at this time. Last meds, theraflu and mucinex, this morning. Alert and oriented.

## 2015-12-06 NOTE — Discharge Instructions (Signed)
Please read attached information. If you experience any new or worsening signs or symptoms please return to the emergency room for evaluation. Please follow-up with your primary care provider or specialist as discussed.  °

## 2015-12-08 ENCOUNTER — Ambulatory Visit (INDEPENDENT_AMBULATORY_CARE_PROVIDER_SITE_OTHER): Payer: BLUE CROSS/BLUE SHIELD | Admitting: Family Medicine

## 2015-12-08 ENCOUNTER — Encounter: Payer: Self-pay | Admitting: General Practice

## 2015-12-08 ENCOUNTER — Encounter: Payer: Self-pay | Admitting: Family Medicine

## 2015-12-08 VITALS — BP 112/82 | HR 72 | Temp 99.2°F | Resp 17 | Ht 69.0 in | Wt 222.1 lb

## 2015-12-08 DIAGNOSIS — J0100 Acute maxillary sinusitis, unspecified: Secondary | ICD-10-CM

## 2015-12-08 DIAGNOSIS — J029 Acute pharyngitis, unspecified: Secondary | ICD-10-CM | POA: Diagnosis not present

## 2015-12-08 DIAGNOSIS — J370 Chronic laryngitis: Secondary | ICD-10-CM

## 2015-12-08 LAB — POCT RAPID STREP A (OFFICE): RAPID STREP A SCREEN: NEGATIVE

## 2015-12-08 MED ORDER — LEVOFLOXACIN 500 MG PO TABS: 500.0000 mg | ORAL_TABLET | Freq: Every day | ORAL | 0 refills | Status: DC

## 2015-12-08 NOTE — Patient Instructions (Signed)
Follow up as needed We'll call you with your ENT appt for the recurrent laryngitis Start the Levaquin once daily- take w/ food- for the sinus infection Drink plenty of fluids Ibuprofen for vocal cord inflammation Vocal rest! Call with any questions or concerns Hang in there!!!

## 2015-12-08 NOTE — Progress Notes (Signed)
Pre visit review using our clinic review tool, if applicable. No additional management support is needed unless otherwise documented below in the visit note. 

## 2015-12-08 NOTE — Progress Notes (Signed)
   Subjective:    Patient ID: Kristina Neal, female    DOB: [birth year], 40 y.o.   MRN: [id number]  HPI URI- sxs started 6 days ago.  4 days ago completely lost voice.  + subjective fever.  + sinus pain/pressure.  No ear pain.  No N/V.  + decreased appetite.  + cough- productive.  Mild sore throat.  No known sick contacts.  +HA.  Pt reports recurrent laryngitis   Review of Systems For ROS see HPI     Objective:   Physical Exam  Constitutional: She is oriented to person, place, and time. She appears well-developed and well-nourished. No distress.  HENT:  Head: Normocephalic and atraumatic.  Right Ear: Tympanic membrane normal.  Left Ear: Tympanic membrane normal.  Nose: Mucosal edema and rhinorrhea present. Right sinus exhibits maxillary sinus tenderness and frontal sinus tenderness. Left sinus exhibits maxillary sinus tenderness and frontal sinus tenderness.  Mouth/Throat: Uvula is midline and mucous membranes are normal. Posterior oropharyngeal erythema present. No oropharyngeal exudate.  Eyes: Conjunctivae and EOM are normal. Pupils are equal, round, and reactive to light.  Neck: Normal range of motion. Neck supple.  Cardiovascular: Normal rate, regular rhythm and normal heart sounds.   Pulmonary/Chest: Effort normal and breath sounds normal. No respiratory distress. She has no wheezes.  Lymphadenopathy:    She has no cervical adenopathy.  Neurological: She is alert and oriented to person, place, and time.  Skin: Skin is warm and dry.  Psychiatric: She has a normal mood and affect. Her behavior is normal. Thought content normal.  Vitals reviewed.         Assessment & Plan:

## 2015-12-08 NOTE — Assessment & Plan Note (Signed)
Pt's sxs and PE WNL.  Start abx.  Refer to ENT as pt has recurrent laryngitis.  Reviewed supportive care and red flags that should prompt return.  Pt expressed understanding and is in agreement w/ plan.

## 2015-12-17 ENCOUNTER — Telehealth: Payer: Self-pay | Admitting: Family Medicine

## 2015-12-17 NOTE — Telephone Encounter (Signed)
Pt states that seen last week, pt states that now she has a sore throat and still no voice, pt asking what can she do, can KT call something in? [facility] [address]. Pt is not able to get in with ENT.

## 2015-12-17 NOTE — Telephone Encounter (Signed)
Pt was informed that per PCP that she needs to follow up with ENT. Pt expressed an understanding.

## 2015-12-17 NOTE — Telephone Encounter (Signed)
Found encounter from ENT, pt was seen in their office on 12/11/15 (paperwork placed on ledge for PCP to review).

## 2015-12-17 NOTE — Telephone Encounter (Signed)
ENT indicated that she should finish prednisone, abx, and work on acid reflux measures.  If still no improvement, should f/u w/ ENT as they have evaluated her already

## 2016-02-04 ENCOUNTER — Encounter: Payer: Self-pay | Admitting: Endocrinology

## 2016-02-04 ENCOUNTER — Ambulatory Visit (INDEPENDENT_AMBULATORY_CARE_PROVIDER_SITE_OTHER): Payer: BLUE CROSS/BLUE SHIELD | Admitting: Endocrinology

## 2016-02-04 VITALS — BP 122/80 | HR 77 | Ht 69.0 in | Wt 221.0 lb

## 2016-02-04 DIAGNOSIS — E108 Type 1 diabetes mellitus with unspecified complications: Secondary | ICD-10-CM | POA: Diagnosis not present

## 2016-02-04 DIAGNOSIS — IMO0002 Reserved for concepts with insufficient information to code with codable children: Secondary | ICD-10-CM

## 2016-02-04 DIAGNOSIS — E1065 Type 1 diabetes mellitus with hyperglycemia: Secondary | ICD-10-CM | POA: Diagnosis not present

## 2016-02-04 LAB — POCT GLYCOSYLATED HEMOGLOBIN (HGB A1C): Hemoglobin A1C: 12

## 2016-02-04 MED ORDER — INSULIN GLARGINE 100 UNIT/ML SOLOSTAR PEN: 40.0000 [IU] | PEN_INJECTOR | SUBCUTANEOUS | 11 refills | Status: DC

## 2016-02-04 NOTE — Progress Notes (Signed)
Subjective:    Patient ID: Kristina Neal, female    DOB: [birth year], 40 y.o.   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   Dx'ed: [id number] Complications: none Therapy: insulin since 2011, when she had DKA.   GDM: never.  DKA: twice (2011 and 2014).  Severe hypoglycemia: never. Pancreatitis: never.   Other: due to noncompliance, she is on a QD insulin schedule.  She now works 1st shift, M-F, in an office.  She stopped V-GO pump, due to cost; she says cbg on work days is similar to days off.   Interval history: Pt says she now never misses the insulin.  no cbg record, but she says cbg varies from 60 up to the high-100's.  She says it is lowest at lunch.    She took 2 courses of steroids for URI last month. Past Medical History:  Diagnosis Date  . Anemia   . Asthma   . Diabetes mellitus without complication ([place])   . GERD (gastroesophageal reflux disease)   . Pancreatic insufficiency 2015    Past Surgical History:  Procedure Laterality Date  . CERVICAL BIOPSY  W/ LOOP ELECTRODE EXCISION    . cervical tumor removed    . DILATION AND CURETTAGE OF UTERUS  2012  . LEEP    . ROBOT ASSISTED MYOMECTOMY N/A 08/23/2013   Procedure: ROBOTIC ASSISTED MYOMECTOMY;  Surgeon: Lahoma Crocker, MD;  Location: [facility];  Service: Gynecology;  Laterality: N/A;  . WHIPPLE PROCEDURE  2008    Social History   Social History  . Marital status: Legally Separated    Spouse name: N/A  . Number of children: 0  . Years of education: N/A   Occupational History  . [employer]    Social History Main Topics  . Smoking status: Former Smoker    Types: Cigarettes    Quit date: 03/21/2006  . Smokeless tobacco: Never Used  . Alcohol use No  . Drug use: No  . Sexual activity: Yes    Partners: Male    Birth control/ protection: None   Other Topics Concern  . Not on file   Social History  Narrative  . No narrative on file    Current Outpatient Prescriptions on File Prior to Visit  Medication Sig Dispense Refill  . albuterol (PROVENTIL HFA;VENTOLIN HFA) 108 (90 BASE) MCG/ACT inhaler Inhale 1-2 puffs into the lungs every 6 (six) hours as needed for wheezing or shortness of breath. 1 Inhaler 0  . Blood Glucose Monitoring Suppl (ACCU-CHEK AVIVA) device 1 each by Other route once. Use as instructed 1 each 0  . cholecalciferol (VITAMIN D) 1000 units tablet Take 6,000 Units by mouth daily.     . citalopram (CELEXA) 40 MG tablet TAKE 1 TABLET BY MOUTH EVERY DAY 30 tablet 3  . ferrous sulfate 325 (65 FE) MG tablet Take 325 mg by mouth daily with breakfast.    . glucose blood test strip 1 each by Other route 2 (two) times daily. And lancets 2/day 100 each 12  . Insulin Pen Needle 31G X 5 MM MISC Use to inject insulin 1 time per day. 100 each 2  . Melatonin 3 MG CAPS Take 3 mg by mouth at bedtime as needed (for sleep).     . Pancrelipase, Lip-Prot-Amyl, (ZENPEP) 40000 units CPEP Take 2 capsules by mouth 4 (four) times daily -  with meals and at bedtime. Dose is 2 capsules with meals and 1 capsule with snacks 100 capsule 11  . Prenatal Vit-Fe Fumarate-FA (MULTIVITAMIN-PRENATAL) 27-0.8 MG TABS tablet Take 1 tablet by mouth daily at 12 noon.    Marland Kitchen PRESCRIPTION MEDICATION Spirax- menstrual cramps.    . rizatriptan (MAXALT) 10 MG tablet Take 1 tablet (10 mg total) by mouth as needed for migraine. May repeat in 2 hours if needed 10 tablet 3  . topiramate (TOPAMAX) 50 MG tablet TAKE 1 TABLET BY MOUTH TWICE DAILY 60 tablet 0   No current facility-administered medications on file prior to visit.     Allergies  Allergen Reactions  . Sulfa Antibiotics Other (See Comments)    Causes headache.  . Amoxicillin Hives and Rash    Has patient had a PCN reaction causing immediate rash, facial/tongue/throat swelling, SOB or lightheadedness with hypotension:Yes Has patient had a PCN reaction causing  severe rash involving mucus membranes or skin necrosis:Yes Has patient had a PCN reaction that required hospitalization:No Has patient had a PCN reaction occurring within the last 10 years:Yes If all of the above answers are "NO", then may proceed with Cephalosporin use.   Cathie Olden Rash    Family History  Problem Relation Age of Onset  . Stroke Mother   . Hypertension Mother   . Diabetes Father   . Hypertension Maternal Grandmother   . Dementia Maternal Grandmother   . Heart disease Maternal Grandmother   . Bone cancer Maternal Grandfather   . Diabetes Paternal Grandmother   . Hypertension Paternal Grandmother   . Heart disease Paternal Grandmother   . Prostate cancer Paternal Grandfather   . Breast cancer Paternal Aunt   . Colon cancer Neg Hx   . Colon polyps Neg Hx   . Esophageal cancer Neg Hx     BP 122/80   Pulse 77   Ht 5\' 9"  (1.753 m)   Wt 221 lb (100.2 kg)   SpO2 98%   BMI 32.64 kg/m    Review of Systems She denies hypoglycemia.     Objective:   Physical Exam VITAL SIGNS:  See vs page GENERAL: no distress Pulses: dorsalis pedis intact bilat.   MSK: no deformity of the feet CV: no leg edema Skin:  no ulcer on the feet.  normal color and temp on the feet. Neuro: sensation is intact to touch on the feet   A1c=12%    Assessment & Plan:  Type 1 DM: worse. URI: steroids are exacerbating glycemic control.  Patient is advised the following: Patient Instructions  check your blood sugar twice a day.  vary the time of day when you check, between before the 3 meals, and at bedtime.  also check if you have symptoms of your blood sugar being too high or too low.  please keep a record of the readings and bring it to your next appointment here.  You can write it on any piece of paper.  please call us sooner if your blood sugar goes below 70, or if you have a lot of readings over 200.   In view of your medical condition, you should avoid pregnancy until we have  decided it is safe.  Please increase the insulin to 40 units each morning. Call if you go back on steroids, so we can adjust the insulin On this type of insulin schedule, you should eat meals on a regular schedule.  If a meal is missed or significantly delayed, your blood sugar  could go low.   Please come back for a follow-up appointment in 2 months.

## 2016-02-04 NOTE — Patient Instructions (Addendum)
check your blood sugar twice a day.  vary the time of day when you check, between before the 3 meals, and at bedtime.  also check if you have symptoms of your blood sugar being too high or too low.  please keep a record of the readings and bring it to your next appointment here.  You can write it on any piece of paper.  please call us sooner if your blood sugar goes below 70, or if you have a lot of readings over 200.   In view of your medical condition, you should avoid pregnancy until we have decided it is safe.  Please increase the insulin to 40 units each morning. Call if you go back on steroids, so we can adjust the insulin On this type of insulin schedule, you should eat meals on a regular schedule.  If a meal is missed or significantly delayed, your blood sugar could go low.   Please come back for a follow-up appointment in 2 months.

## 2016-03-01 ENCOUNTER — Other Ambulatory Visit: Payer: Self-pay | Admitting: Family Medicine

## 2016-04-04 NOTE — Progress Notes (Signed)
Subjective:    Patient ID: Kristina Neal, female    DOB: 1976-03-10, 41 y.o.   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   Dx'ed: [id number] Complications: none Therapy: insulin since 2011, when she had DKA.   GDM: never.  DKA: twice (2011 and 2014).  Severe hypoglycemia: never. Pancreatitis: never.   Other: due to noncompliance, she is on a QD insulin schedule.  She now works 1st shift, M-F, in an office.  She stopped V-GO pump, due to cost; she says cbg on work days is similar to days off.   Interval history: Pt says she never misses the insulin.  She has lost her insurance.  She had another steroid injection into the left knee, 6 weeks ago.  no cbg record, but states cbg's are high. Past Medical History:  Diagnosis Date  . Anemia   . Asthma   . Diabetes mellitus without complication ([place])   . GERD (gastroesophageal reflux disease)   . Pancreatic insufficiency 2015    Past Surgical History:  Procedure Laterality Date  . CERVICAL BIOPSY  W/ LOOP ELECTRODE EXCISION    . cervical tumor removed    . DILATION AND CURETTAGE OF UTERUS  2012  . LEEP    . ROBOT ASSISTED MYOMECTOMY N/A 08/23/2013   Procedure: ROBOTIC ASSISTED MYOMECTOMY;  Surgeon: Lahoma Crocker, MD;  Location: [facility];  Service: Gynecology;  Laterality: N/A;  . WHIPPLE PROCEDURE  2008    Social History   Social History  . Marital status: Legally Separated    Spouse name: N/A  . Number of children: 0  . Years of education: N/A   Occupational History  . [employer]    Social History Main Topics  . Smoking status: Former Smoker    Types: Cigarettes    Quit date: 03/21/2006  . Smokeless tobacco: Never Used  . Alcohol use No  . Drug use: No  . Sexual activity: Yes    Partners: Male    Birth control/ protection: None   Other Topics Concern  . Not on file   Social History Narrative  . No  narrative on file    Current Outpatient Prescriptions on File Prior to Visit  Medication Sig Dispense Refill  . albuterol (PROVENTIL HFA;VENTOLIN HFA) 108 (90 BASE) MCG/ACT inhaler Inhale 1-2 puffs into the lungs every 6 (six) hours as needed for wheezing or shortness of breath. 1 Inhaler 0  . cholecalciferol (VITAMIN D) 1000 units tablet Take 6,000 Units by mouth daily.     . citalopram (CELEXA) 40 MG tablet TAKE 1 TABLET BY MOUTH EVERY DAY 30 tablet 3  . ferrous sulfate 325 (65 FE) MG tablet Take 325 mg by mouth daily with breakfast.    . glucose blood test strip 1 each by Other route 2 (two) times daily. And lancets 2/day 100 each 12  . Insulin Pen Needle 31G X 5 MM MISC Use to inject insulin 1 time per day. 100 each 2  . Melatonin 3 MG CAPS Take 3 mg by mouth at bedtime as needed (for sleep).     Marland Kitchen omeprazole (PRILOSEC) 40 MG capsule Take 40 mg by mouth daily.    . Pancrelipase, Lip-Prot-Amyl, (ZENPEP) 40000 units CPEP Take 2 capsules by mouth 4 (four) times daily -  with meals and at bedtime. Dose is 2 capsules with meals and 1 capsule with  snacks 100 capsule 11  . Prenatal Vit-Fe Fumarate-FA (MULTIVITAMIN-PRENATAL) 27-0.8 MG TABS tablet Take 1 tablet by mouth daily at 12 noon.    Marland Kitchen PRESCRIPTION MEDICATION Spirax- menstrual cramps.    . rizatriptan (MAXALT) 10 MG tablet Take 1 tablet (10 mg total) by mouth as needed for migraine. May repeat in 2 hours if needed 10 tablet 3  . topiramate (TOPAMAX) 50 MG tablet TAKE 1 TABLET BY MOUTH TWICE DAILY 60 tablet 0   No current facility-administered medications on file prior to visit.     Allergies  Allergen Reactions  . Sulfa Antibiotics Other (See Comments)    Causes headache.  . Amoxicillin Hives and Rash    Has patient had a PCN reaction causing immediate rash, facial/tongue/throat swelling, SOB or lightheadedness with hypotension:Yes Has patient had a PCN reaction causing severe rash involving mucus membranes or skin necrosis:Yes Has  patient had a PCN reaction that required hospitalization:No Has patient had a PCN reaction occurring within the last 10 years:Yes If all of the above answers are "NO", then may proceed with Cephalosporin use.   Cathie Olden Rash    Family History  Problem Relation Age of Onset  . Stroke Mother   . Hypertension Mother   . Diabetes Father   . Hypertension Maternal Grandmother   . Dementia Maternal Grandmother   . Heart disease Maternal Grandmother   . Bone cancer Maternal Grandfather   . Diabetes Paternal Grandmother   . Hypertension Paternal Grandmother   . Heart disease Paternal Grandmother   . Prostate cancer Paternal Grandfather   . Breast cancer Paternal Aunt   . Colon cancer Neg Hx   . Colon polyps Neg Hx   . Esophageal cancer Neg Hx     BP 126/84   Pulse 78   Ht 5\' 9"  (1.753 m)   Wt 226 lb (102.5 kg)   SpO2 98%   BMI 33.37 kg/m    Review of Systems She denies hypoglycemia.      Objective:   Physical Exam VITAL SIGNS:  See vs page GENERAL: no distress Pulses: dorsalis pedis intact bilat.   MSK: no deformity of the feet CV: no leg edema Skin:  no ulcer on the feet.  normal color and temp on the feet. Neuro: sensation is intact to touch on the feet.    A1c=11.4%     Assessment & Plan:  Type 1 DM: worse. Knee pain: steroid injection is affecting a1c Patient is advised the following: Patient Instructions  check your blood sugar twice a day.  vary the time of day when you check, between before the 3 meals, and at bedtime.  also check if you have symptoms of your blood sugar being too high or too low.  please keep a record of the readings and bring it to your next appointment here.  You can write it on any piece of paper.  please call us sooner if your blood sugar goes below 70, or if you have a lot of readings over 200.   In view of your medical condition, you should avoid pregnancy until we have decided it is safe.   Please increase the insulin to 60 units each  morning. Instead on lantus, take NPH, which is cheaper to but at [employer].  Meters and strips are also cheapest at [employer].  On this type of insulin schedule, you should eat meals on a regular schedule (especially lunch).  If a meal is missed or significantly delayed, your blood sugar  could go low.   Please come back for a follow-up appointment in 2 months.

## 2016-04-05 ENCOUNTER — Ambulatory Visit (INDEPENDENT_AMBULATORY_CARE_PROVIDER_SITE_OTHER): Payer: Self-pay | Admitting: Endocrinology

## 2016-04-05 ENCOUNTER — Encounter: Payer: Self-pay | Admitting: Endocrinology

## 2016-04-05 VITALS — BP 126/84 | HR 78 | Ht 69.0 in | Wt 226.0 lb

## 2016-04-05 DIAGNOSIS — E108 Type 1 diabetes mellitus with unspecified complications: Secondary | ICD-10-CM

## 2016-04-05 DIAGNOSIS — IMO0002 Reserved for concepts with insufficient information to code with codable children: Secondary | ICD-10-CM

## 2016-04-05 DIAGNOSIS — E1065 Type 1 diabetes mellitus with hyperglycemia: Secondary | ICD-10-CM

## 2016-04-05 LAB — POCT GLYCOSYLATED HEMOGLOBIN (HGB A1C): Hemoglobin A1C: 11.4

## 2016-04-05 MED ORDER — INSULIN NPH (HUMAN) (ISOPHANE) 100 UNIT/ML ~~LOC~~ SUSP: 60.0000 [IU] | SUBCUTANEOUS | 11 refills | Status: DC

## 2016-04-05 NOTE — Patient Instructions (Addendum)
check your blood sugar twice a day.  vary the time of day when you check, between before the 3 meals, and at bedtime.  also check if you have symptoms of your blood sugar being too high or too low.  please keep a record of the readings and bring it to your next appointment here.  You can write it on any piece of paper.  please call us sooner if your blood sugar goes below 70, or if you have a lot of readings over 200.   In view of your medical condition, you should avoid pregnancy until we have decided it is safe.   Please increase the insulin to 60 units each morning. Instead on lantus, take NPH, which is cheaper to but at [employer].  Meters and strips are also cheapest at [employer].  On this type of insulin schedule, you should eat meals on a regular schedule (especially lunch).  If a meal is missed or significantly delayed, your blood sugar could go low.   Please come back for a follow-up appointment in 2 months.

## 2016-04-13 ENCOUNTER — Telehealth: Payer: Self-pay | Admitting: Endocrinology

## 2016-04-13 NOTE — Telephone Encounter (Signed)
See message to be advised.  

## 2016-04-13 NOTE — Telephone Encounter (Signed)
Patient b/s is low she do not know how low, because she do not have her meter, she feels like she is about to pass out, and shaky.  I told her to go to the emergency room. FYI

## 2016-04-14 ENCOUNTER — Encounter (HOSPITAL_COMMUNITY): Payer: Self-pay | Admitting: Emergency Medicine

## 2016-04-14 ENCOUNTER — Emergency Department (HOSPITAL_COMMUNITY)
Admission: EM | Admit: 2016-04-14 | Discharge: 2016-04-14 | Disposition: A | Discharge status: Home / Self Care | Payer: Self-pay | Attending: Emergency Medicine | Admitting: Emergency Medicine

## 2016-04-14 DIAGNOSIS — E10649 Type 1 diabetes mellitus with hypoglycemia without coma: Secondary | ICD-10-CM | POA: Insufficient documentation

## 2016-04-14 DIAGNOSIS — Z79899 Other long term (current) drug therapy: Secondary | ICD-10-CM | POA: Insufficient documentation

## 2016-04-14 DIAGNOSIS — Z87891 Personal history of nicotine dependence: Secondary | ICD-10-CM | POA: Insufficient documentation

## 2016-04-14 DIAGNOSIS — J45909 Unspecified asthma, uncomplicated: Secondary | ICD-10-CM | POA: Insufficient documentation

## 2016-04-14 DIAGNOSIS — E162 Hypoglycemia, unspecified: Secondary | ICD-10-CM

## 2016-04-14 LAB — CBG MONITORING, ED
GLUCOSE-CAPILLARY: 173 mg/dL — AB (ref 65–99)
Glucose-Capillary: 121 mg/dL — ABNORMAL HIGH (ref 65–99)

## 2016-04-14 NOTE — ED Provider Notes (Signed)
[facility] DEPT Provider Note   CSN: [id number] Arrival date & time: 04/14/16  [id number]     History   Chief Complaint Chief Complaint  Patient presents with  . blood glucose problem    HPI Eran Anguelique Ekins is a 41 y.o. female.  The history is provided by the patient. No language interpreter was used.   Dalasia Anguelique Asker is a 41 y.o. female who presents to the Emergency Department complaining of hypoglycemia.  She has a hx/o T1DM and her insulin was changed yesterday.  She went to the [facility] ED for evaluation and was d/cd home last night.  She was previously on lantus daily until Tuesday. Yesterday she was changed to NPH 35 units daily and Regular insulin 30 units with meals.  She took both insulins at lunch time yesterday, ate lunch and then began to feel poorly and was noted to be hypoglycemic.  She was watched in the ED and fed and d/cd home.  She received an additional 30 units of regular insulin at dinner time.  Her blood sugar dropped to 72 last night and this morning it was too low to read.  She drank juice and ate. She has not taken any insulin today.  She endorses feeling poorly and feeling thirsty.   Past Medical History:  Diagnosis Date  . Anemia   . Asthma   . Diabetes mellitus without complication ([place])   . GERD (gastroesophageal reflux disease)   . Pancreatic insufficiency 2015    Patient Active Problem List   Diagnosis Date Noted  . Overweight 01/30/2015  . Possible pregnancy 05/22/2014  . Acute maxillary sinusitis 04/28/2014  . Increased bowel frequency 02/21/2014  . Physical exam 01/22/2014  . Depression 01/22/2014  . Irregular menses 12/12/2013  . Ethmoid sinusitis 12/12/2013  . Migraine 12/12/2013  . DM I (diabetes mellitus, type I), uncontrolled ([place]) 10/03/2013  . Follow-up examination, following unspecified surgery 09/18/2013  . Fibroids, subserous 08/04/2013  . Abdominal pain, lower 08/04/2013  . Low grade squamous intraepithelial  lesion (LGSIL) on Papanicolaou smear of cervix 04/16/2013  . Routine gynecological examination 04/12/2013  . Candidal vulvovaginitis 04/12/2013    Past Surgical History:  Procedure Laterality Date  . CERVICAL BIOPSY  W/ LOOP ELECTRODE EXCISION    . cervical tumor removed    . DILATION AND CURETTAGE OF UTERUS  2012  . LEEP    . ROBOT ASSISTED MYOMECTOMY N/A 08/23/2013   Procedure: ROBOTIC ASSISTED MYOMECTOMY;  Surgeon: Lahoma Crocker, MD;  Location: [facility];  Service: Gynecology;  Laterality: N/A;  . WHIPPLE PROCEDURE  2008    OB History    Gravida Para Term Preterm AB Living   4       4 0   SAB TAB Ectopic Multiple Live Births   4               Home Medications    Prior to Admission medications   Medication Sig Start Date End Date Taking? Authorizing Provider  albuterol (PROVENTIL HFA;VENTOLIN HFA) 108 (90 BASE) MCG/ACT inhaler Inhale 1-2 puffs into the lungs every 6 (six) hours as needed for wheezing or shortness of breath. 02/05/14   Elnora Morrison, MD  cholecalciferol (VITAMIN D) 1000 units tablet Take 6,000 Units by mouth daily.     Historical Provider, MD  citalopram (CELEXA) 40 MG tablet TAKE 1 TABLET BY MOUTH EVERY DAY 03/01/16   Midge Minium, MD  ferrous sulfate 325 (65 FE) MG tablet Take 325 mg by mouth daily  with breakfast.    Historical Provider, MD  glucose blood test strip 1 each by Other route 2 (two) times daily. And lancets 2/day 08/21/15   Renato Shin, MD  insulin NPH Human (HUMULIN N) 100 UNIT/ML injection Inject 0.6 mLs (60 Units total) into the skin every morning. And syringes 1/day 04/05/16   Renato Shin, MD  Insulin Pen Needle 31G X 5 MM MISC Use to inject insulin 1 time per day. 08/07/15   Renato Shin, MD  Melatonin 3 MG CAPS Take 3 mg by mouth at bedtime as needed (for sleep).     Historical Provider, MD  omeprazole (PRILOSEC) 40 MG capsule Take 40 mg by mouth daily.    Historical Provider, MD  Pancrelipase, Lip-Prot-Amyl, (ZENPEP) 40000 units CPEP  Take 2 capsules by mouth 4 (four) times daily -  with meals and at bedtime. Dose is 2 capsules with meals and 1 capsule with snacks 11/24/15   [name] III, MD  Prenatal Vit-Fe Fumarate-FA (MULTIVITAMIN-PRENATAL) 27-0.8 MG TABS tablet Take 1 tablet by mouth daily at 12 noon.    Historical Provider, MD  PRESCRIPTION MEDICATION Spirax- menstrual cramps.    Historical Provider, MD  rizatriptan (MAXALT) 10 MG tablet Take 1 tablet (10 mg total) by mouth as needed for migraine. May repeat in 2 hours if needed 06/10/15   Midge Minium, MD  topiramate (TOPAMAX) 50 MG tablet TAKE 1 TABLET BY MOUTH TWICE DAILY 10/15/15   Midge Minium, MD    Family History Family History  Problem Relation Age of Onset  . Stroke Mother   . Hypertension Mother   . Diabetes Father   . Hypertension Maternal Grandmother   . Dementia Maternal Grandmother   . Heart disease Maternal Grandmother   . Bone cancer Maternal Grandfather   . Diabetes Paternal Grandmother   . Hypertension Paternal Grandmother   . Heart disease Paternal Grandmother   . Prostate cancer Paternal Grandfather   . Breast cancer Paternal Aunt   . Colon cancer Neg Hx   . Colon polyps Neg Hx   . Esophageal cancer Neg Hx     Social History Social History  Substance Use Topics  . Smoking status: Former Smoker    Types: Cigarettes    Quit date: 03/21/2006  . Smokeless tobacco: Never Used  . Alcohol use No     Allergies   Sulfa antibiotics; Amoxicillin; and Silver   Review of Systems Review of Systems  All other systems reviewed and are negative.    Physical Exam Updated Vital Signs BP 106/59   Pulse 65   Temp 98 F (36.7 C) (Oral)   Resp 15   SpO2 100%   Physical Exam  Constitutional: She is oriented to person, place, and time. She appears well-developed and well-nourished.  HENT:  Head: Normocephalic and atraumatic.  Cardiovascular: Normal rate and regular rhythm.   No murmur heard. Pulmonary/Chest: Effort normal  and breath sounds normal. No respiratory distress.  Abdominal: Soft. There is no tenderness. There is no rebound and no guarding.  Musculoskeletal: She exhibits no edema or tenderness.  Neurological: She is alert and oriented to person, place, and time.  Skin: Skin is warm and dry.  Psychiatric: She has a normal mood and affect. Her behavior is normal.  Nursing note and vitals reviewed.    ED Treatments / Results  Labs (all labs ordered are listed, but only abnormal results are displayed) Labs Reviewed  CBG MONITORING, ED - Abnormal; Notable for the  following:       Result Value   Glucose-Capillary 173 (*)    All other components within normal limits  CBG MONITORING, ED - Abnormal; Notable for the following:    Glucose-Capillary 121 (*)    All other components within normal limits  I-STAT CHEM 8, ED    EKG  EKG Interpretation None       Radiology No results found.  Procedures Procedures (including critical care time)  Medications Ordered in ED Medications - No data to display   Initial Impression / Assessment and Plan / ED Course  I have reviewed the triage vital signs and the nursing notes.  Pertinent labs & imaging results that were available during my care of the patient were reviewed by me and considered in my medical decision making (see chart for details).     Patient with history of diabetes here with episodes of hypoglycemia yesterday and this morning after a change in her medications. She is nontoxic in the emergency department and in no distress. Discussed the patient with Dr. [name] with Endocrinology. We will discontinue her regular insulin and decrease her NPH. Counseled pt on homecare for diabetes as well as outpatient follow up in the next week, return precautions.    Final Clinical Impressions(s) / ED Diagnoses   Final diagnoses:  Hypoglycemia    New Prescriptions Discharge Medication List as of 04/14/2016  1:29 PM       Quintella Reichert,  MD 04/14/16 1452

## 2016-04-14 NOTE — ED Triage Notes (Addendum)
Pt reports she had a hypoglycemic episode last night which caused her to go to [place] when she woke up she was hypoglycemic again. Ate animal crackers and OJ prior to arrival. Hx of type 1 DM. Recently had increase and change in insulin regimen. Has not taken any insulin this am.

## 2016-04-14 NOTE — Discharge Instructions (Signed)
Stop taking your regular insulin. Take 28 units of NPH units once daily. Check your sugar sugars 4 times daily

## 2016-04-14 NOTE — ED Notes (Signed)
Gave pt sandwich  

## 2016-04-19 ENCOUNTER — Ambulatory Visit (INDEPENDENT_AMBULATORY_CARE_PROVIDER_SITE_OTHER): Payer: Self-pay | Admitting: Endocrinology

## 2016-04-19 VITALS — BP 126/86 | HR 70 | Ht 69.0 in | Wt 229.0 lb

## 2016-04-19 DIAGNOSIS — IMO0002 Reserved for concepts with insufficient information to code with codable children: Secondary | ICD-10-CM

## 2016-04-19 DIAGNOSIS — E108 Type 1 diabetes mellitus with unspecified complications: Secondary | ICD-10-CM

## 2016-04-19 DIAGNOSIS — E1065 Type 1 diabetes mellitus with hyperglycemia: Secondary | ICD-10-CM

## 2016-04-19 NOTE — Patient Instructions (Addendum)
check your blood sugar twice a day.  vary the time of day when you check, between before the 3 meals, and at bedtime.  also check if you have symptoms of your blood sugar being too high or too low.  please keep a record of the readings and bring it to your next appointment here.  You can write it on any piece of paper.  please call us sooner if your blood sugar goes below 70, or if you have a lot of readings over 200.   In view of your medical condition, you should avoid pregnancy until we have decided it is safe.   Please take NPH insulin 60 units each morning, which is cheaper to buy at [place].  This is the only insulin you will take.  On this type of insulin schedule, you should eat meals on a regular schedule (especially lunch).  If a meal is missed or significantly delayed, your blood sugar could go low.   Please come back for a follow-up appointment in 1 month.

## 2016-04-19 NOTE — Progress Notes (Signed)
Subjective:    Patient ID: Kristina Neal, female    DOB: 10/19/1975, 41 y.o.   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   Dx'ed: [id number] Complications: none Therapy: insulin since 2011, when she had DKA.   GDM: never.  DKA: twice (2011 and 2014).  Severe hypoglycemia: never. Pancreatitis: never.   Other: due to noncompliance, she is on a QD insulin schedule.  She now works 1st shift, M-F, in an office.  She stopped V-GO pump, due to cost; she says cbg on work days is similar to days off.   Interval history: Pt says she never misses the insulin.  She has lost her insurance.  She was seen in ER twice recently for severe hypoglycemia.  She was taking reg in addition to NPH.  She says this was because there was an rx at [facility] both reg and NPH, but epic has no record of the reg ever having being sent.  Meter is downloaded today, and the printout is scanned into the record.  It varies from 60-340.  There is no trend throughout the day.   Past Medical History:  Diagnosis Date  . Anemia   . Asthma   . Diabetes mellitus without complication ([place])   . GERD (gastroesophageal reflux disease)   . Pancreatic insufficiency 2015    Past Surgical History:  Procedure Laterality Date  . CERVICAL BIOPSY  W/ LOOP ELECTRODE EXCISION    . cervical tumor removed    . DILATION AND CURETTAGE OF UTERUS  2012  . LEEP    . ROBOT ASSISTED MYOMECTOMY N/A 08/23/2013   Procedure: ROBOTIC ASSISTED MYOMECTOMY;  Surgeon: Lahoma Crocker, MD;  Location: [facility];  Service: Gynecology;  Laterality: N/A;  . WHIPPLE PROCEDURE  2008    Social History   Social History  . Marital status: Legally Separated    Spouse name: N/A  . Number of children: 0  . Years of education: N/A   Occupational History  . [employer]    Social History Main Topics  . Smoking status: Former Smoker    Types:  Cigarettes    Quit date: 03/21/2006  . Smokeless tobacco: Never Used  . Alcohol use No  . Drug use: No  . Sexual activity: Yes    Partners: Male    Birth control/ protection: None   Other Topics Concern  . Not on file   Social History Narrative  . No narrative on file    Current Outpatient Prescriptions on File Prior to Visit  Medication Sig Dispense Refill  . albuterol (PROVENTIL HFA;VENTOLIN HFA) 108 (90 BASE) MCG/ACT inhaler Inhale 1-2 puffs into the lungs every 6 (six) hours as needed for wheezing or shortness of breath. 1 Inhaler 0  . cholecalciferol (VITAMIN D) 1000 units tablet Take 6,000 Units by mouth daily.     . citalopram (CELEXA) 40 MG tablet TAKE 1 TABLET BY MOUTH EVERY DAY 30 tablet 3  . ferrous sulfate 325 (65 FE) MG tablet Take 325 mg by mouth daily with breakfast.    . glucose blood test strip 1 each by Other route 2 (two) times daily. And lancets 2/day 100 each 12  . insulin NPH Human (HUMULIN N) 100 UNIT/ML injection Inject 0.6 mLs (60 Units total) into the skin every morning. And syringes 1/day 20 mL 11  . Insulin Pen Needle 31G X 5 MM  MISC Use to inject insulin 1 time per day. 100 each 2  . Melatonin 3 MG CAPS Take 3 mg by mouth at bedtime as needed (for sleep).     Marland Kitchen omeprazole (PRILOSEC) 40 MG capsule Take 40 mg by mouth daily.    . Pancrelipase, Lip-Prot-Amyl, (ZENPEP) 40000 units CPEP Take 2 capsules by mouth 4 (four) times daily -  with meals and at bedtime. Dose is 2 capsules with meals and 1 capsule with snacks 100 capsule 11  . Prenatal Vit-Fe Fumarate-FA (MULTIVITAMIN-PRENATAL) 27-0.8 MG TABS tablet Take 1 tablet by mouth daily at 12 noon.    Marland Kitchen PRESCRIPTION MEDICATION Spirax- menstrual cramps.    . rizatriptan (MAXALT) 10 MG tablet Take 1 tablet (10 mg total) by mouth as needed for migraine. May repeat in 2 hours if needed 10 tablet 3  . topiramate (TOPAMAX) 50 MG tablet TAKE 1 TABLET BY MOUTH TWICE DAILY 60 tablet 0   No current facility-administered  medications on file prior to visit.     Allergies  Allergen Reactions  . Sulfa Antibiotics Other (See Comments)    Causes headache.  . Amoxicillin Hives and Rash    Has patient had a PCN reaction causing immediate rash, facial/tongue/throat swelling, SOB or lightheadedness with hypotension:Yes Has patient had a PCN reaction causing severe rash involving mucus membranes or skin necrosis:Yes Has patient had a PCN reaction that required hospitalization:No Has patient had a PCN reaction occurring within the last 10 years:Yes If all of the above answers are "NO", then may proceed with Cephalosporin use.   Cathie Olden Rash    Family History  Problem Relation Age of Onset  . Stroke Mother   . Hypertension Mother   . Diabetes Father   . Hypertension Maternal Grandmother   . Dementia Maternal Grandmother   . Heart disease Maternal Grandmother   . Bone cancer Maternal Grandfather   . Diabetes Paternal Grandmother   . Hypertension Paternal Grandmother   . Heart disease Paternal Grandmother   . Prostate cancer Paternal Grandfather   . Breast cancer Paternal Aunt   . Colon cancer Neg Hx   . Colon polyps Neg Hx   . Esophageal cancer Neg Hx     BP 126/86   Pulse 70   Ht 5\' 9"  (1.753 m)   Wt 229 lb (103.9 kg)   SpO2 97%   BMI 33.82 kg/m    Review of Systems No weight change.      Objective:   Physical Exam VITAL SIGNS:  See vs page GENERAL: no distress Pulses: dorsalis pedis intact bilat.   MSK: no deformity of the feet CV: no leg edema Skin:  no ulcer on the feet.  normal color and temp on the feet. Neuro: sensation is intact to touch on the feet.   Lab Results  Component Value Date   HGBA1C 11.4 04/05/2016      Assessment & Plan:  Insulin-requiring type 2 DM: ongoing poor control. Noncompliance with cbg recording and insulin: this compromises her self-care Severe hypoglycemia, due to taking reg insulin as well as NPH.  Patient is advised the following: Patient  Instructions  check your blood sugar twice a day.  vary the time of day when you check, between before the 3 meals, and at bedtime.  also check if you have symptoms of your blood sugar being too high or too low.  please keep a record of the readings and bring it to your next appointment here.  You can  write it on any piece of paper.  please call us sooner if your blood sugar goes below 70, or if you have a lot of readings over 200.   In view of your medical condition, you should avoid pregnancy until we have decided it is safe.   Please take NPH insulin 60 units each morning, which is cheaper to buy at [facility].  This is the only insulin you will take.  On this type of insulin schedule, you should eat meals on a regular schedule (especially lunch).  If a meal is missed or significantly delayed, your blood sugar could go low.   Please come back for a follow-up appointment in 1 month.

## 2016-05-10 ENCOUNTER — Encounter: Payer: Self-pay | Admitting: General Practice

## 2016-05-19 ENCOUNTER — Ambulatory Visit (INDEPENDENT_AMBULATORY_CARE_PROVIDER_SITE_OTHER): Payer: Self-pay | Admitting: Endocrinology

## 2016-05-19 VITALS — BP 112/82 | HR 72 | Ht 69.0 in | Wt 233.0 lb

## 2016-05-19 DIAGNOSIS — E1065 Type 1 diabetes mellitus with hyperglycemia: Secondary | ICD-10-CM

## 2016-05-19 DIAGNOSIS — IMO0002 Reserved for concepts with insufficient information to code with codable children: Secondary | ICD-10-CM

## 2016-05-19 DIAGNOSIS — E108 Type 1 diabetes mellitus with unspecified complications: Secondary | ICD-10-CM

## 2016-05-19 NOTE — Progress Notes (Signed)
Subjective:    Patient ID: Kristina Neal, female    DOB: 29-Nov-1975, 41 y.o.   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   Dx'ed: [id number] Complications: none Therapy: insulin since 2011, when she had DKA.   GDM: never.  DKA: twice (2011 and 2014).  Severe hypoglycemia: last time was Jan of 2018 Pancreatitis: never.   Other: due to noncompliance, she is on a QD insulin schedule.  She now works 1st shift, M-F, in an office.  She stopped V-GO pump, due to cost; she says cbg on work days is similar to days off.   Interval history: no cbg record, but states cbg's are mildly low approx twice a week.  This usually happens fasting.  It is highest after breakfast.   Past Medical History:  Diagnosis Date  . Anemia   . Asthma   . Diabetes mellitus without complication ([place])   . GERD (gastroesophageal reflux disease)   . Pancreatic insufficiency 2015    Past Surgical History:  Procedure Laterality Date  . CERVICAL BIOPSY  W/ LOOP ELECTRODE EXCISION    . cervical tumor removed    . DILATION AND CURETTAGE OF UTERUS  2012  . LEEP    . ROBOT ASSISTED MYOMECTOMY N/A 08/23/2013   Procedure: ROBOTIC ASSISTED MYOMECTOMY;  Surgeon: Lahoma Crocker, MD;  Location: [facility];  Service: Gynecology;  Laterality: N/A;  . WHIPPLE PROCEDURE  2008    Social History   Social History  . Marital status: Legally Separated    Spouse name: N/A  . Number of children: 0  . Years of education: N/A   Occupational History  . [employer]    Social History Main Topics  . Smoking status: Former Smoker    Types: Cigarettes    Quit date: 03/21/2006  . Smokeless tobacco: Never Used  . Alcohol use No  . Drug use: No  . Sexual activity: Yes    Partners: Male    Birth control/ protection: None   Other Topics Concern  . Not on file   Social History Narrative  . No narrative on file     Current Outpatient Prescriptions on File Prior to Visit  Medication Sig Dispense Refill  . albuterol (PROVENTIL HFA;VENTOLIN HFA) 108 (90 BASE) MCG/ACT inhaler Inhale 1-2 puffs into the lungs every 6 (six) hours as needed for wheezing or shortness of breath. 1 Inhaler 0  . cholecalciferol (VITAMIN D) 1000 units tablet Take 6,000 Units by mouth daily.     . citalopram (CELEXA) 40 MG tablet TAKE 1 TABLET BY MOUTH EVERY DAY 30 tablet 3  . ferrous sulfate 325 (65 FE) MG tablet Take 325 mg by mouth daily with breakfast.    . glucose blood test strip 1 each by Other route 2 (two) times daily. And lancets 2/day 100 each 12  . insulin NPH Human (HUMULIN N) 100 UNIT/ML injection Inject 0.6 mLs (60 Units total) into the skin every morning. And syringes 1/day 20 mL 11  . Insulin Pen Needle 31G X 5 MM MISC Use to inject insulin 1 time per day. 100 each 2  . Melatonin 3 MG CAPS Take 3 mg by mouth at bedtime as needed (for sleep).     Marland Kitchen omeprazole (PRILOSEC) 40 MG capsule Take 40 mg by mouth daily.    . Pancrelipase, Lip-Prot-Amyl, (ZENPEP) 40000 units CPEP Take 2 capsules  by mouth 4 (four) times daily -  with meals and at bedtime. Dose is 2 capsules with meals and 1 capsule with snacks 100 capsule 11  . Prenatal Vit-Fe Fumarate-FA (MULTIVITAMIN-PRENATAL) 27-0.8 MG TABS tablet Take 1 tablet by mouth daily at 12 noon.    Marland Kitchen PRESCRIPTION MEDICATION Spirax- menstrual cramps.    . rizatriptan (MAXALT) 10 MG tablet Take 1 tablet (10 mg total) by mouth as needed for migraine. May repeat in 2 hours if needed 10 tablet 3  . topiramate (TOPAMAX) 50 MG tablet TAKE 1 TABLET BY MOUTH TWICE DAILY 60 tablet 0   No current facility-administered medications on file prior to visit.     Allergies  Allergen Reactions  . Sulfa Antibiotics Other (See Comments)    Causes headache.  . Amoxicillin Hives and Rash    Has patient had a PCN reaction causing immediate rash, facial/tongue/throat swelling, SOB or lightheadedness  with hypotension:Yes Has patient had a PCN reaction causing severe rash involving mucus membranes or skin necrosis:Yes Has patient had a PCN reaction that required hospitalization:No Has patient had a PCN reaction occurring within the last 10 years:Yes If all of the above answers are "NO", then may proceed with Cephalosporin use.   Cathie Olden Rash    Family History  Problem Relation Age of Onset  . Stroke Mother   . Hypertension Mother   . Diabetes Father   . Hypertension Maternal Grandmother   . Dementia Maternal Grandmother   . Heart disease Maternal Grandmother   . Bone cancer Maternal Grandfather   . Diabetes Paternal Grandmother   . Hypertension Paternal Grandmother   . Heart disease Paternal Grandmother   . Prostate cancer Paternal Grandfather   . Breast cancer Paternal Aunt   . Colon cancer Neg Hx   . Colon polyps Neg Hx   . Esophageal cancer Neg Hx     BP 112/82   Pulse 72   Ht 5\' 9"  (1.753 m)   Wt 233 lb (105.7 kg)   SpO2 98%   BMI 34.41 kg/m   Review of Systems Denies LOC    Objective:   Physical Exam VITAL SIGNS:  See vs page GENERAL: no distress Pulses: dorsalis pedis intact bilat.   MSK: no deformity of the feet CV: no leg edema Skin:  no ulcer on the feet.  normal color and temp on the feet. Neuro: sensation is intact to touch on the feet.        Assessment & Plan:  Insulin-requiring type 2 DM: uncertain control.  Check fructosamine.    Patient is advised the following: Patient Instructions  check your blood sugar twice a day.  vary the time of day when you check, between before the 3 meals, and at bedtime.  also check if you have symptoms of your blood sugar being too high or too low.  please keep a record of the readings and bring it to your next appointment here.  You can write it on any piece of paper.  please call us sooner if your blood sugar goes below 70, or if you have a lot of readings over 200.   In view of your medical condition, you  should avoid pregnancy until we have decided it is safe.   Please continue the same insulin. blood tests are requested for you today.  We'll let you know about the results. If it is high, an option would be to change to "70/30."   On this type of insulin schedule,  you should eat meals on a regular schedule (especially lunch).  If a meal is missed or significantly delayed, your blood sugar could go low.   Please come back for a follow-up appointment in 2 months.

## 2016-05-19 NOTE — Patient Instructions (Addendum)
check your blood sugar twice a day.  vary the time of day when you check, between before the 3 meals, and at bedtime.  also check if you have symptoms of your blood sugar being too high or too low.  please keep a record of the readings and bring it to your next appointment here.  You can write it on any piece of paper.  please call us sooner if your blood sugar goes below 70, or if you have a lot of readings over 200.   In view of your medical condition, you should avoid pregnancy until we have decided it is safe.   Please continue the same insulin. blood tests are requested for you today.  We'll let you know about the results. If it is high, an option would be to change to "70/30."   On this type of insulin schedule, you should eat meals on a regular schedule (especially lunch).  If a meal is missed or significantly delayed, your blood sugar could go low.   Please come back for a follow-up appointment in 2 months.

## 2016-05-21 IMAGING — CT CT HEAD W/O CM
2 series · 16 of 30 positions shown, 20 images · non-contrast
Comparison: None.

CLINICAL DATA: Headache.  Initial encounter.  Headache for 1 month.

EXAM:
CT HEAD WITHOUT CONTRAST
TECHNIQUE: Contiguous axial images were obtained from the base of the skull
through the vertex without intravenous contrast.

[Series 2: head w/o · axial · non-contrast · 0.45mm/px · z∈[-119,+1]mm · 13 of 29 slices shown, 17 images]
[im 3/29  brain]
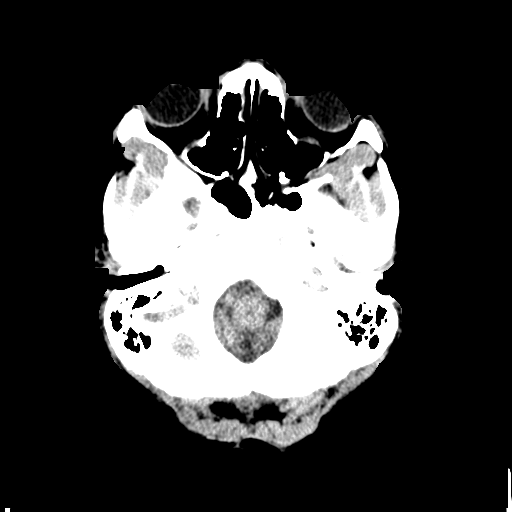
[im 3/29  bone]
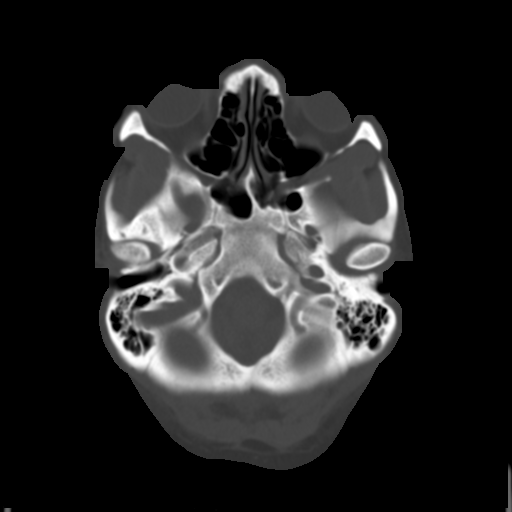
[im 5/29  brain]
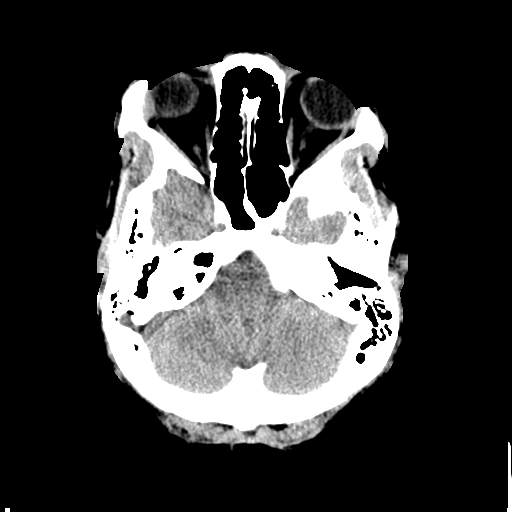
[im 7/29  brain]
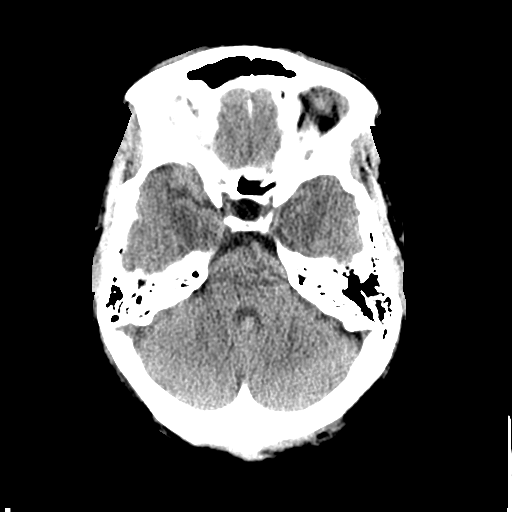
[im 9/29  brain]
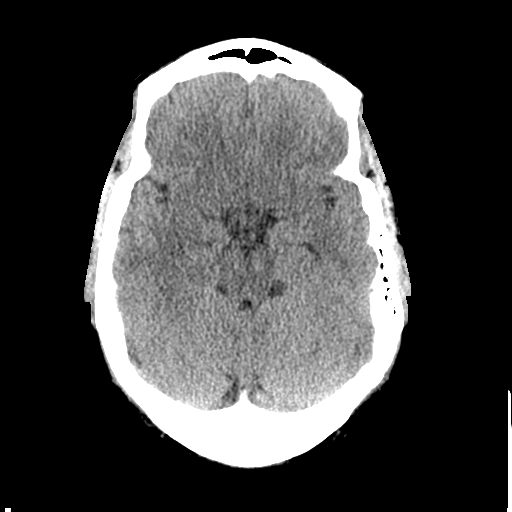
[im 11/29  brain]
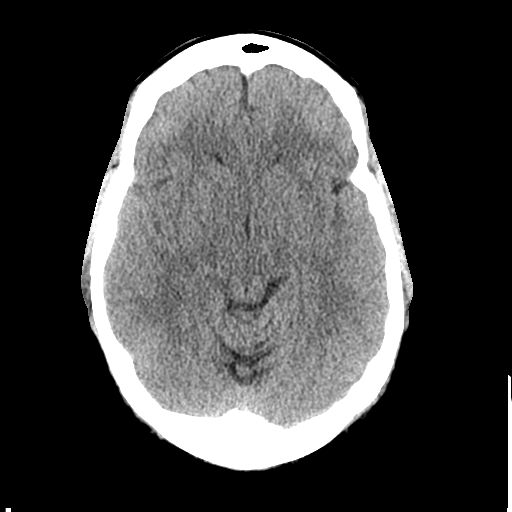
[im 11/29  bone]
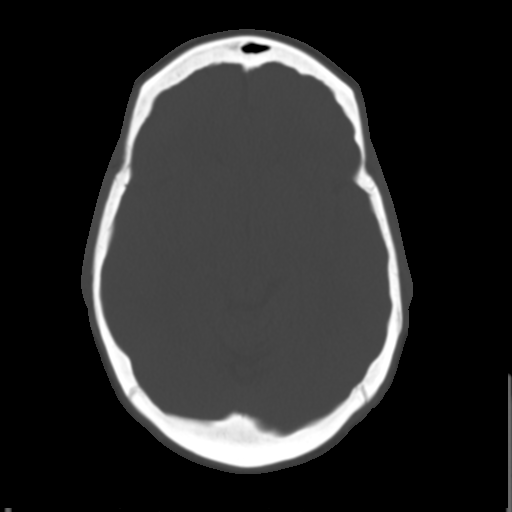
[im 13/29  brain]
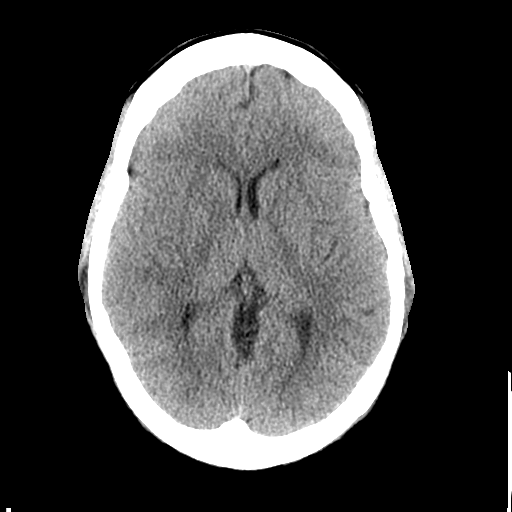
[im 15/29  brain]
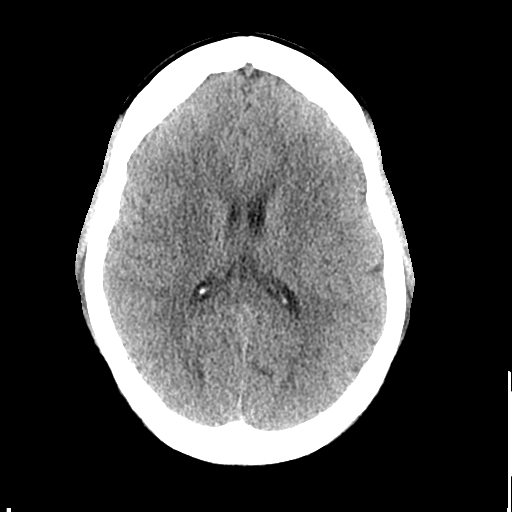
[im 17/29  brain]
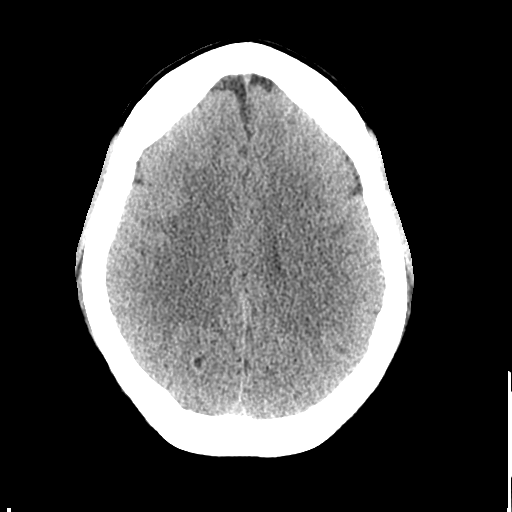
[im 19/29  brain]
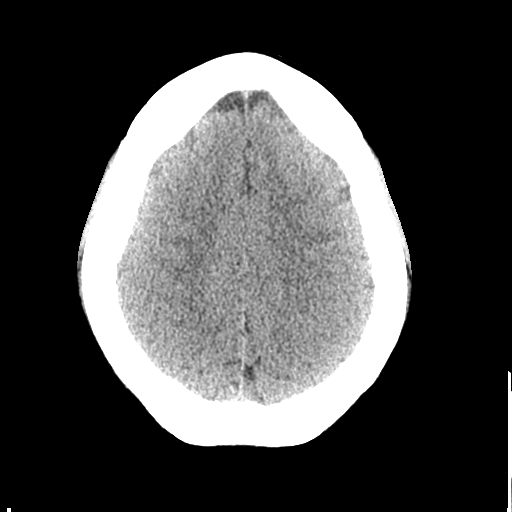
[im 19/29  bone]
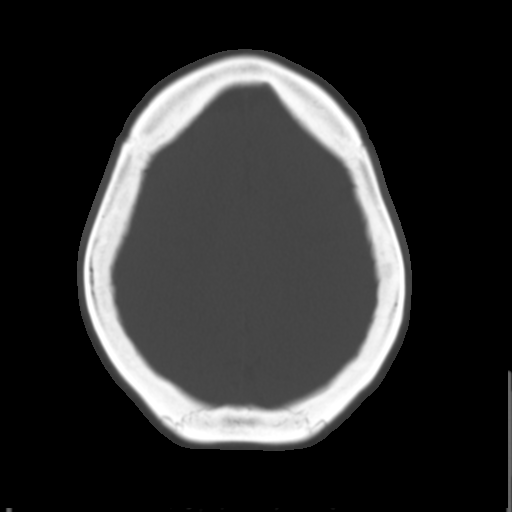
[im 21/29  brain]
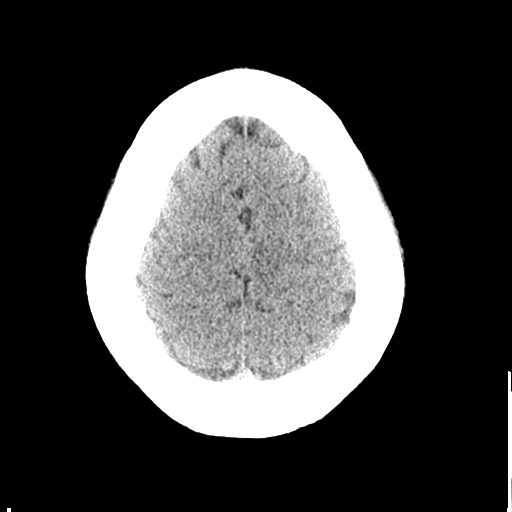
[im 23/29  brain]
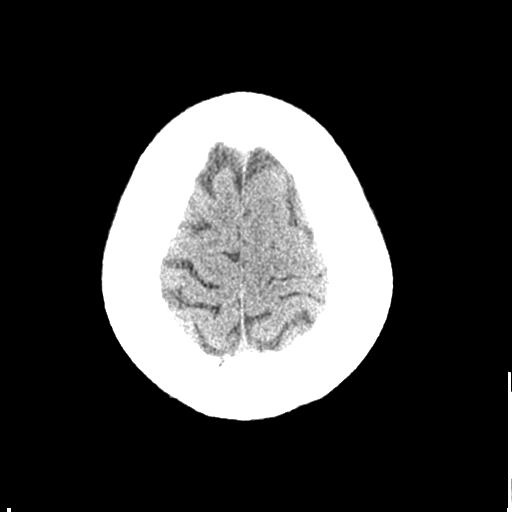
[im 25/29  brain]
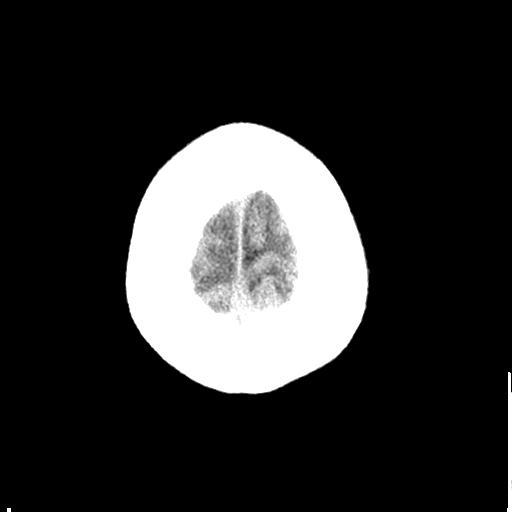
[im 27/29  brain]
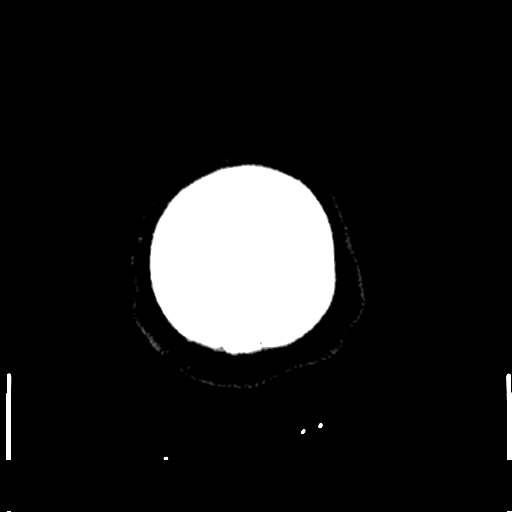
[im 27/29  bone]
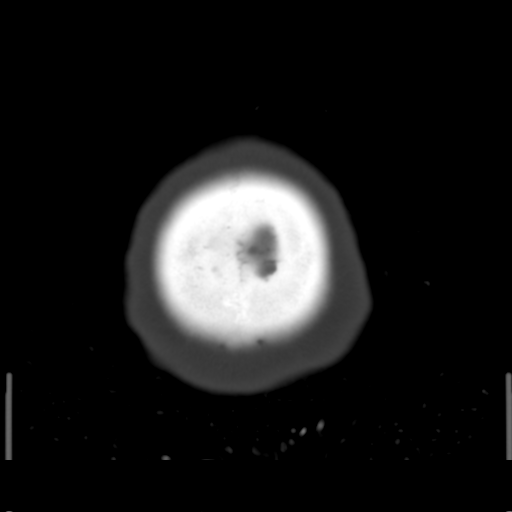

[Series 3: bone windows · axial · 0.45mm/px · z∈[-119,-79]mm · 3 of 29 slices shown]
[im 3/29  bone]
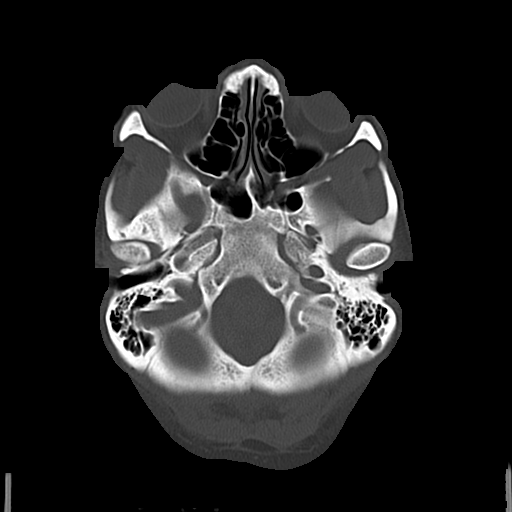
[im 7/29  bone]
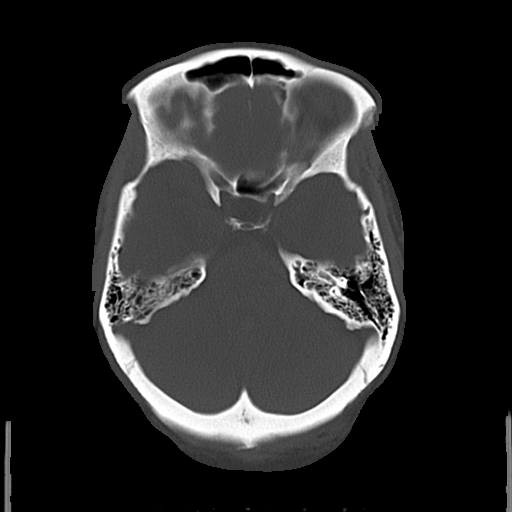
[im 11/29  bone]
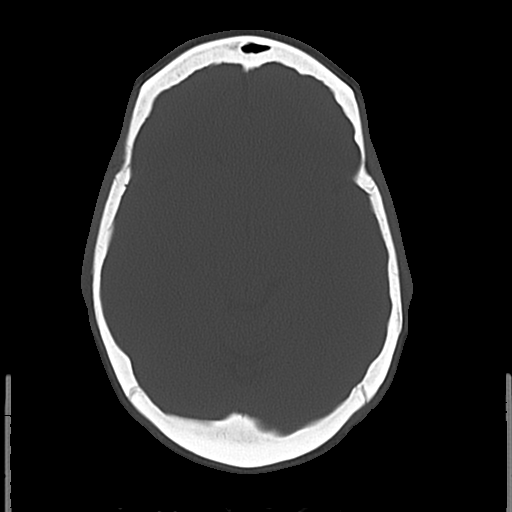

[16 of 30 positions shown; findings below may reference images not displayed]

FINDINGS: No mass lesion, mass effect, midline shift, hydrocephalus,
hemorrhage. No territorial ischemia or acute infarction. The visible
paranasal sinuses and mastoid air cells are normal.
IMPRESSION: Negative CT head.

## 2016-05-23 LAB — FRUCTOSAMINE: FRUCTOSAMINE: 332 umol/L — AB (ref 190–270)

## 2016-06-03 ENCOUNTER — Ambulatory Visit: Payer: Self-pay | Admitting: Endocrinology

## 2016-06-09 ENCOUNTER — Emergency Department (HOSPITAL_COMMUNITY): Payer: Self-pay

## 2016-06-09 ENCOUNTER — Emergency Department (HOSPITAL_COMMUNITY)
Admission: EM | Admit: 2016-06-09 | Discharge: 2016-06-09 | Disposition: A | Discharge status: Home / Self Care | Payer: Self-pay | Attending: Emergency Medicine | Admitting: Emergency Medicine

## 2016-06-09 ENCOUNTER — Encounter (HOSPITAL_COMMUNITY): Payer: Self-pay | Admitting: Emergency Medicine

## 2016-06-09 DIAGNOSIS — Z79899 Other long term (current) drug therapy: Secondary | ICD-10-CM | POA: Insufficient documentation

## 2016-06-09 DIAGNOSIS — E109 Type 1 diabetes mellitus without complications: Secondary | ICD-10-CM | POA: Insufficient documentation

## 2016-06-09 DIAGNOSIS — N739 Female pelvic inflammatory disease, unspecified: Secondary | ICD-10-CM | POA: Insufficient documentation

## 2016-06-09 DIAGNOSIS — J45909 Unspecified asthma, uncomplicated: Secondary | ICD-10-CM | POA: Insufficient documentation

## 2016-06-09 DIAGNOSIS — R109 Unspecified abdominal pain: Secondary | ICD-10-CM

## 2016-06-09 DIAGNOSIS — Z87891 Personal history of nicotine dependence: Secondary | ICD-10-CM | POA: Insufficient documentation

## 2016-06-09 DIAGNOSIS — N898 Other specified noninflammatory disorders of vagina: Secondary | ICD-10-CM | POA: Insufficient documentation

## 2016-06-09 DIAGNOSIS — N730 Acute parametritis and pelvic cellulitis: Secondary | ICD-10-CM

## 2016-06-09 LAB — CBC
HCT: 37.4 % (ref 36.0–46.0)
HEMOGLOBIN: 12.3 g/dL (ref 12.0–15.0)
MCH: 27.8 pg (ref 26.0–34.0)
MCHC: 32.9 g/dL (ref 30.0–36.0)
MCV: 84.6 fL (ref 78.0–100.0)
Platelets: 198 10*3/uL (ref 150–400)
RBC: 4.42 MIL/uL (ref 3.87–5.11)
RDW: 14 % (ref 11.5–15.5)
WBC: 12.2 10*3/uL — AB (ref 4.0–10.5)

## 2016-06-09 LAB — HCG, QUANTITATIVE, PREGNANCY: hCG, Beta Chain, Quant, S: <1 m[IU]/mL (ref <5)

## 2016-06-09 LAB — URINALYSIS, ROUTINE W REFLEX MICROSCOPIC
Bacteria, UA: NONE SEEN
Bilirubin Urine: NEGATIVE
GLUCOSE, UA: NEGATIVE mg/dL
KETONES UR: NEGATIVE mg/dL
Leukocytes, UA: NEGATIVE
NITRITE: NEGATIVE
PH: 6 (ref 5.0–8.0)
PROTEIN: NEGATIVE mg/dL
Specific Gravity, Urine: 1.009 (ref 1.005–1.030)

## 2016-06-09 LAB — COMPREHENSIVE METABOLIC PANEL
ALK PHOS: 78 U/L (ref 38–126)
ALT: 21 U/L (ref 14–54)
ANION GAP: 10 (ref 5–15)
AST: 28 U/L (ref 15–41)
Albumin: 3.9 g/dL (ref 3.5–5.0)
BILIRUBIN TOTAL: 0.5 mg/dL (ref 0.3–1.2)
BUN: <5 mg/dL — ABNORMAL LOW (ref 6–20)
CALCIUM: 8.9 mg/dL (ref 8.9–10.3)
CO2: 26 mmol/L (ref 22–32)
CREATININE: 0.65 mg/dL (ref 0.44–1.00)
Chloride: 100 mmol/L — ABNORMAL LOW (ref 101–111)
Glucose, Bld: 159 mg/dL — ABNORMAL HIGH (ref 65–99)
Potassium: 4 mmol/L (ref 3.5–5.1)
SODIUM: 136 mmol/L (ref 135–145)
TOTAL PROTEIN: 7.8 g/dL (ref 6.5–8.1)

## 2016-06-09 LAB — WET PREP, GENITAL
Clue Cells Wet Prep HPF POC: NONE SEEN
Sperm: NONE SEEN
Trich, Wet Prep: NONE SEEN
Yeast Wet Prep HPF POC: NONE SEEN

## 2016-06-09 LAB — RAPID HIV SCREEN (HIV 1/2 AB+AG)
HIV 1/2 ANTIBODIES: NONREACTIVE
HIV-1 P24 ANTIGEN - HIV24: NONREACTIVE

## 2016-06-09 LAB — LIPASE, BLOOD: Lipase: <10 U/L — ABNORMAL LOW (ref 11–51)

## 2016-06-09 MED ORDER — MORPHINE SULFATE (PF) 4 MG/ML IV SOLN: 2.0000 mg | Freq: Once | INTRAVENOUS | Status: DC

## 2016-06-09 MED ORDER — SODIUM CHLORIDE 0.9 % IV BOLUS (SEPSIS)
1000.0000 mL | Freq: Once | INTRAVENOUS | Status: AC
  Administered 2016-06-09: 1000 mL via INTRAVENOUS

## 2016-06-09 MED ORDER — AZITHROMYCIN 1 G PO PACK
2.0000 g | PACK | Freq: Once | ORAL | Status: AC
  Administered 2016-06-09: 2 g via ORAL
  Filled 2016-06-09: qty 2

## 2016-06-09 MED ORDER — IOPAMIDOL (ISOVUE-300) INJECTION 61%
100.0000 mL | Freq: Once | INTRAVENOUS | Status: AC | PRN
  Administered 2016-06-09: 100 mL via INTRAVENOUS

## 2016-06-09 MED ORDER — GENTAMICIN SULFATE 40 MG/ML IJ SOLN
240.0000 mg | Freq: Once | INTRAMUSCULAR | Status: DC
  Filled 2016-06-09: qty 6

## 2016-06-09 MED ORDER — IOPAMIDOL (ISOVUE-300) INJECTION 61%
INTRAVENOUS | Status: AC
  Filled 2016-06-09: qty 100

## 2016-06-09 MED ORDER — DOXYCYCLINE HYCLATE 100 MG PO CAPS: 100.0000 mg | ORAL_CAPSULE | Freq: Two times a day (BID) | ORAL | 0 refills | Status: AC

## 2016-06-09 MED ORDER — CEFTRIAXONE SODIUM 250 MG IJ SOLR
250.0000 mg | Freq: Once | INTRAMUSCULAR | Status: AC
  Administered 2016-06-09: 250 mg via INTRAMUSCULAR
  Filled 2016-06-09: qty 250

## 2016-06-09 MED ORDER — KETOROLAC TROMETHAMINE 15 MG/ML IJ SOLN
15.0000 mg | Freq: Once | INTRAMUSCULAR | Status: AC
  Administered 2016-06-09: 15 mg via INTRAVENOUS
  Filled 2016-06-09: qty 1

## 2016-06-09 MED ORDER — MORPHINE SULFATE (PF) 4 MG/ML IV SOLN
4.0000 mg | Freq: Once | INTRAVENOUS | Status: AC
  Administered 2016-06-09: 4 mg via INTRAVENOUS
  Filled 2016-06-09: qty 1

## 2016-06-09 MED ORDER — LIDOCAINE HCL (PF) 1 % IJ SOLN
INTRAMUSCULAR | Status: AC
  Filled 2016-06-09: qty 30

## 2016-06-09 NOTE — ED Provider Notes (Signed)
[facility] DEPT Provider Note   CSN: [id number] Arrival date & time: 06/09/16  1615     History   Chief Complaint Chief Complaint  Patient presents with  . Abdominal Pain    HPI Kristina Neal is a 41 y.o. female G4 P0 63 presenting with lower abdominal sharp pain and heaviness since yesterday morning with gradual onset progressively worsening until it was unbearable today. She states that the pain is somewhat similar to what she has experienced with miscarriage in the past. She has tried ibuprofen without relief. She was one week late for her period and it has been lighter and longer than normal. She reports still currently spotting. She did a home pregnancy test last week after being late for 1 week which was negative. She explains that this is what usually happens it doesn't show up in her urine and later shows up in her blood. She denies fever, chills, nausea, vomiting, dysuria, hematuria, diarrhea, or any other symptoms.  HPI  Past Medical History:  Diagnosis Date  . Anemia   . Asthma   . Diabetes mellitus without complication ([place])   . GERD (gastroesophageal reflux disease)   . Pancreatic insufficiency 2015    Patient Active Problem List   Diagnosis Date Noted  . Overweight 01/30/2015  . Possible pregnancy 05/22/2014  . Acute maxillary sinusitis 04/28/2014  . Increased bowel frequency 02/21/2014  . Physical exam 01/22/2014  . Depression 01/22/2014  . Irregular menses 12/12/2013  . Ethmoid sinusitis 12/12/2013  . Migraine 12/12/2013  . DM I (diabetes mellitus, type I), uncontrolled ([place]) 10/03/2013  . Follow-up examination, following unspecified surgery 09/18/2013  . Fibroids, subserous 08/04/2013  . Abdominal pain, lower 08/04/2013  . 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 Routine gynecological examination 04/12/2013  . Candidal vulvovaginitis 04/12/2013    Past Surgical History:    Procedure Laterality Date  . CERVICAL BIOPSY  W/ LOOP ELECTRODE EXCISION    . cervical tumor removed    . DILATION AND CURETTAGE OF UTERUS  2012  . LEEP    . ROBOT ASSISTED MYOMECTOMY N/A 08/23/2013   Procedure: ROBOTIC ASSISTED MYOMECTOMY;  Surgeon: Lahoma Crocker, MD;  Location: [facility];  Service: Gynecology;  Laterality: N/A;  . WHIPPLE PROCEDURE  2008    OB History    Gravida Para Term Preterm AB Living   4       4 0   SAB TAB Ectopic Multiple Live Births   4               Home Medications    Prior to Admission medications   Medication Sig Start Date End Date Taking? Authorizing Provider  albuterol (PROVENTIL HFA;VENTOLIN HFA) 108 (90 BASE) MCG/ACT inhaler Inhale 1-2 puffs into the lungs every 6 (six) hours as needed for wheezing or shortness of breath. 02/05/14  Yes Elnora Morrison, MD  citalopram (CELEXA) 40 MG tablet TAKE 1 TABLET BY MOUTH EVERY DAY 03/01/16  Yes Midge Minium, MD  insulin NPH Human (HUMULIN N) 100 UNIT/ML injection Inject 0.6 mLs (60 Units total) into the skin every morning. And syringes 1/day 04/05/16  Yes Renato Shin, MD  omeprazole (PRILOSEC) 40 MG capsule Take 40 mg by mouth daily as needed (indigestion).    Yes Historical Provider, MD  rizatriptan (MAXALT) 10 MG tablet Take 1 tablet (10 mg total) by mouth as needed for migraine. May repeat in 2 hours if needed 06/10/15  Yes Midge Minium,  MD  topiramate (TOPAMAX) 50 MG tablet TAKE 1 TABLET BY MOUTH TWICE DAILY 10/15/15  Yes Midge Minium, MD  doxycycline (VIBRAMYCIN) 100 MG capsule Take 1 capsule (100 mg total) by mouth 2 (two) times daily. 06/09/16 06/23/16  Emeline General, PA-C  glucose blood test strip 1 each by Other route 2 (two) times daily. And lancets 2/day 08/21/15   Renato Shin, MD  Insulin Pen Needle 31G X 5 MM MISC Use to inject insulin 1 time per day. 08/07/15   Renato Shin, MD  Pancrelipase, Lip-Prot-Amyl, (ZENPEP) 40000 units CPEP Take 2 capsules by mouth 4 (four) times daily  -  with meals and at bedtime. Dose is 2 capsules with meals and 1 capsule with snacks 11/24/15   [name] III, MD    Family History Family History  Problem Relation Age of Onset  . Stroke Mother   . Hypertension Mother   . Diabetes Father   . Hypertension Maternal Grandmother   . Dementia Maternal Grandmother   . Heart disease Maternal Grandmother   . Bone cancer Maternal Grandfather   . Diabetes Paternal Grandmother   . Hypertension Paternal Grandmother   . Heart disease Paternal Grandmother   . Prostate cancer Paternal Grandfather   . Breast cancer Paternal Aunt   . Colon cancer Neg Hx   . Colon polyps Neg Hx   . Esophageal cancer Neg Hx     Social History Social History  Substance Use Topics  . Smoking status: Former Smoker    Types: Cigarettes    Quit date: 03/21/2006  . Smokeless tobacco: Never Used  . Alcohol use No     Allergies   Sulfa antibiotics; Amoxicillin; and Silver   Review of Systems Review of Systems  Constitutional: Negative for chills and fever.  HENT: Negative for ear pain and sore throat.   Eyes: Negative for pain and visual disturbance.  Respiratory: Negative for cough, shortness of breath, wheezing and stridor.   Cardiovascular: Negative for chest pain, palpitations and leg swelling.  Gastrointestinal: Positive for abdominal pain. Negative for abdominal distention, blood in stool, diarrhea, nausea and vomiting.  Genitourinary: Positive for pelvic pain. Negative for difficulty urinating, dysuria, flank pain, frequency, hematuria and vaginal discharge.  Musculoskeletal: Negative for arthralgias, back pain, neck pain and neck stiffness.  Skin: Negative for color change and rash.  Neurological: Negative for seizures and syncope.     Physical Exam Updated Vital Signs BP (!) 109/58 (BP Location: Left Arm)   Pulse 77   Temp 98.2 F (36.8 C) (Oral)   Resp 18   SpO2 100%   Physical Exam  Constitutional: She appears well-developed and  well-nourished. No distress.  Afebrile, nontoxic-appearing, sitting in bed in mild discomfort.  HENT:  Head: Normocephalic and atraumatic.  Eyes: Conjunctivae and EOM are normal. Right eye exhibits no discharge. Left eye exhibits no discharge. No scleral icterus.  Neck: Normal range of motion. Neck supple.  Cardiovascular: Normal rate, regular rhythm and normal heart sounds.   No murmur heard. Pulmonary/Chest: Effort normal and breath sounds normal. No respiratory distress. She has no wheezes. She has no rales. She exhibits no tenderness.  Abdominal: Soft. Bowel sounds are normal. She exhibits no distension and no mass. There is tenderness. There is no rebound and no guarding.  Suprapubic tenderness with palpation of all quadrants.  Genitourinary: Vaginal discharge found.  Genitourinary Comments: Cervical motion tenderness. Yellow green discharge from cervical os.   Musculoskeletal: She exhibits no edema.  Neurological:  She is alert.  Skin: Skin is warm and dry. No rash noted. She is not diaphoretic. No erythema. No pallor.  Psychiatric: She has a normal mood and affect.  Nursing note and vitals reviewed.    ED Treatments / Results  Labs (all labs ordered are listed, but only abnormal results are displayed) Labs Reviewed  WET PREP, GENITAL - Abnormal; Notable for the following:       Result Value   WBC, Wet Prep HPF POC MANY (*)    All other components within normal limits  LIPASE, BLOOD - Abnormal; Notable for the following:    Lipase <10 (*)    All other components within normal limits  COMPREHENSIVE METABOLIC PANEL - Abnormal; Notable for the following:    Chloride 100 (*)    Glucose, Bld 159 (*)    BUN <5 (*)    All other components within normal limits  CBC - Abnormal; Notable for the following:    WBC 12.2 (*)    All other components within normal limits  URINALYSIS, ROUTINE W REFLEX MICROSCOPIC - Abnormal; Notable for the following:    Hgb urine dipstick MODERATE (*)      Squamous Epithelial / LPF 0-5 (*)    All other components within normal limits  HCG, QUANTITATIVE, PREGNANCY  RAPID HIV SCREEN (HIV 1/2 AB+AG)  HIV ANTIBODY (ROUTINE TESTING)  GC/CHLAMYDIA PROBE AMP ([place]) NOT AT [facility]    EKG  EKG Interpretation None       Radiology Ct Abdomen Pelvis W Contrast  Result Date: 06/09/2016 CLINICAL DATA:  Lower pelvic pain and vaginal spotting being yesterday. EXAM: CT ABDOMEN AND PELVIS WITH CONTRAST TECHNIQUE: Multidetector CT imaging of the abdomen and pelvis was performed using the standard protocol following bolus administration of intravenous contrast. CONTRAST:  195mL ISOVUE-300 IOPAMIDOL (ISOVUE-300) INJECTION 61% COMPARISON:  08/09/2014 FINDINGS: Lower chest: Lung bases demonstrate no consolidation or effusion. There is a pleural based 5-6 mm nodular density unchanged. Hepatobiliary: Previous cholecystectomy. Air within the left biliary tree unchanged. Geographic low density over the medial segment left lobe of the liver slightly less apparent. Pancreas: Evidence of previous Whipple procedure. Spleen: Within normal. Adrenals/Urinary Tract: Adrenal glands are normal. Kidneys are normal in size without hydronephrosis or nephrolithiasis. Ureters and bladder are within normal. Stomach/Bowel: Stomach is unremarkable. Small bowel is unremarkable. Appendix is normal. Mild fecal retention throughout the colon as the colon is otherwise unremarkable. Vascular/Lymphatic: Vascular structures are unremarkable. No evidence of adenopathy. Reproductive: Several small uterine fibroids are present. Ovaries unremarkable. Trace free fluid the pelvis. Other: None. Musculoskeletal: Minimal spondylosis of the spine with disc disease at the L5-S1 level. Mild degenerative change of the hips. IMPRESSION: No acute findings in the abdomen/pelvis. Postsurgical change over the upper abdomen compatible previous Whipple procedure. Multiple small uterine fibroids.  Trace free fluid  in the pelvis. Stable pleural based 5- 6 mm nodular density over the right lower lobe. Electronically Signed   By: Marin Olp M.D.   On: 06/09/2016 19:25    Procedures Procedures (including critical care time)  Medications Ordered in ED Medications  iopamidol (ISOVUE-300) 61 % injection (not administered)  azithromycin (ZITHROMAX) powder 2 g (not administered)  cefTRIAXone (ROCEPHIN) injection 250 mg (not administered)  morphine 4 MG/ML injection 4 mg (4 mg Intravenous Given 06/09/16 1825)  sodium chloride 0.9 % bolus 1,000 mL (0 mLs Intravenous Stopped 06/09/16 2024)  iopamidol (ISOVUE-300) 61 % injection 100 mL (100 mLs Intravenous Contrast Given 06/09/16 1856)  ketorolac (TORADOL)  15 MG/ML injection 15 mg (15 mg Intravenous Given 06/09/16 2041)     Initial Impression / Assessment and Plan / ED Course  I have reviewed the triage vital signs and the nursing notes.  Pertinent labs & imaging results that were available during my care of the patient were reviewed by me and considered in my medical decision making (see chart for details).     41 year old female with history of pancreatic tumor and Whipple procedure presenting with lower abdominal pain for 2 days. Patient thought she may be pregnant due to her period late by a week and now spotting for the last 8-9 days. She does have a history of fibroids and has had a myomectomy in the past.  On exam she is tender in the epigastric region which prompted me to order CT abdomen and pelvis due to her history. Patient was agreeable with plan. CT with small fibroids but otherwise unremarkable. Labs and exam are otherwise unremarkable. She is afebrile and nontoxic appearing.  Pelvic exam revealed yellow/green discharge and cervical motion tenderness.  Patient given antibiotics while in ED and discharge home with doxycycline and close follow up with GYN and PCP.  Pain was managed in ED. She reported that the pain returned after a while. Given  toradol and advised to use ibuprofen for pain at home.  Patient was discussed with Dr. Jeneen Rinks who agrees with assessment and plan. Discussed strict return precautions and advised to return to the emergency department if experiencing any new or worsening symptoms. Instructions were understood and patient agreed with discharge plan.   Final Clinical Impressions(s) / ED Diagnoses   Final diagnoses:  Abdominal pain  PID (acute pelvic inflammatory disease)    New Prescriptions New Prescriptions   DOXYCYCLINE (VIBRAMYCIN) 100 MG CAPSULE    Take 1 capsule (100 mg total) by mouth 2 (two) times daily.     Emeline General, PA-C 06/09/16 [place], MD 06/22/16 [phone]

## 2016-06-09 NOTE — ED Triage Notes (Signed)
Patient here with complaints of lower pelvic pain. Reports that she is having difficulty sitting. Also reports "spotting" that started yesterday.

## 2016-06-09 NOTE — Discharge Instructions (Signed)
Take the complete course of antibiotic. Avoid sexual intercourse for the next week. Follow up with your GYN and primary care provider. Ibuprofen for pain. Return to the emergency department if your condition worsen or you experience any new concerning symptoms in the meantime.

## 2016-06-10 LAB — GC/CHLAMYDIA PROBE AMP (~~LOC~~) NOT AT ARMC
CHLAMYDIA, DNA PROBE: NEGATIVE
NEISSERIA GONORRHEA: NEGATIVE

## 2016-06-11 LAB — HIV ANTIBODY (ROUTINE TESTING W REFLEX): HIV Screen 4th Generation wRfx: NONREACTIVE

## 2016-07-04 ENCOUNTER — Inpatient Hospital Stay (HOSPITAL_COMMUNITY)
Admission: AD | Admit: 2016-07-04 | Discharge: 2016-07-04 | Disposition: A | Discharge status: Home / Self Care | Payer: No Typology Code available for payment source | Source: Ambulatory Visit | Attending: Obstetrics and Gynecology | Admitting: Obstetrics and Gynecology

## 2016-07-04 ENCOUNTER — Encounter (HOSPITAL_COMMUNITY): Payer: Self-pay

## 2016-07-04 DIAGNOSIS — Z880 Allergy status to penicillin: Secondary | ICD-10-CM | POA: Diagnosis not present

## 2016-07-04 DIAGNOSIS — R102 Pelvic and perineal pain: Secondary | ICD-10-CM | POA: Diagnosis not present

## 2016-07-04 DIAGNOSIS — Z87891 Personal history of nicotine dependence: Secondary | ICD-10-CM | POA: Diagnosis not present

## 2016-07-04 DIAGNOSIS — Z3202 Encounter for pregnancy test, result negative: Secondary | ICD-10-CM | POA: Diagnosis not present

## 2016-07-04 DIAGNOSIS — R1030 Lower abdominal pain, unspecified: Secondary | ICD-10-CM

## 2016-07-04 DIAGNOSIS — Z794 Long term (current) use of insulin: Secondary | ICD-10-CM | POA: Diagnosis not present

## 2016-07-04 DIAGNOSIS — D259 Leiomyoma of uterus, unspecified: Secondary | ICD-10-CM | POA: Insufficient documentation

## 2016-07-04 DIAGNOSIS — E119 Type 2 diabetes mellitus without complications: Secondary | ICD-10-CM | POA: Diagnosis not present

## 2016-07-04 LAB — CBC
HCT: 33.9 % — ABNORMAL LOW (ref 36.0–46.0)
HEMOGLOBIN: 11.1 g/dL — AB (ref 12.0–15.0)
MCH: 28.1 pg (ref 26.0–34.0)
MCHC: 32.7 g/dL (ref 30.0–36.0)
MCV: 85.8 fL (ref 78.0–100.0)
Platelets: 198 10*3/uL (ref 150–400)
RBC: 3.95 MIL/uL (ref 3.87–5.11)
RDW: 14.4 % (ref 11.5–15.5)
WBC: 8.4 10*3/uL (ref 4.0–10.5)

## 2016-07-04 LAB — URINALYSIS, ROUTINE W REFLEX MICROSCOPIC
Bacteria, UA: NONE SEEN
Bilirubin Urine: NEGATIVE
GLUCOSE, UA: NEGATIVE mg/dL
Ketones, ur: NEGATIVE mg/dL
Leukocytes, UA: NEGATIVE
Nitrite: NEGATIVE
PH: 6 (ref 5.0–8.0)
Protein, ur: NEGATIVE mg/dL
Specific Gravity, Urine: 1.008 (ref 1.005–1.030)

## 2016-07-04 LAB — POCT PREGNANCY, URINE: Preg Test, Ur: NEGATIVE

## 2016-07-04 LAB — WET PREP, GENITAL
Clue Cells Wet Prep HPF POC: NONE SEEN
Sperm: NONE SEEN
Trich, Wet Prep: NONE SEEN
Yeast Wet Prep HPF POC: NONE SEEN

## 2016-07-04 MED ORDER — NAPROXEN 500 MG PO TABS: 500.0000 mg | ORAL_TABLET | Freq: Two times a day (BID) | ORAL | 0 refills | Status: DC

## 2016-07-04 MED ORDER — KETOROLAC TROMETHAMINE 30 MG/ML IJ SOLN
30.0000 mg | Freq: Once | INTRAMUSCULAR | Status: AC
  Administered 2016-07-04: 30 mg via INTRAMUSCULAR
  Filled 2016-07-04: qty 1

## 2016-07-04 NOTE — MAU Note (Signed)
Cycle started on the 13th, having pain since then.  Was dx with fibroids tumors again.  Has had them surgically removed twice.

## 2016-07-04 NOTE — Discharge Instructions (Signed)
Pelvic Pain, Female Pelvic pain is pain in your lower belly (abdomen), below your belly button and between your hips. The pain may start suddenly (acute), keep coming back (recurring), or last a long time (chronic). Pelvic pain that lasts longer than six months is considered chronic. There are many causes of pelvic pain. Sometimes the cause of your pelvic pain is not known. Follow these instructions at home:  Take over-the-counter and prescription medicines only as told by your doctor.  Rest as told by your doctor.  Do not have sex it if hurts.  Keep a journal of your pelvic pain. Write down:  When the pain started.  Where the pain is located.  What seems to make the pain better or worse, such as food or your menstrual cycle.  Any symptoms you have along with the pain.  Keep all follow-up visits as told by your doctor. This is important. Contact a doctor if:  Medicine does not help your pain.  Your pain comes back.  You have new symptoms.  You have unusual vaginal discharge or bleeding.  You have a fever or chills.  You are having a hard time pooping (constipation).  You have blood in your pee (urine) or poop (stool).  Your pee smells bad.  You feel weak or lightheaded. Get help right away if:  You have sudden pain that is very bad.  Your pain continues to get worse.  You have very bad pain and also have any of the following symptoms:  A fever.  Feeling stick to your stomach (nausea).  Throwing up (vomiting).  Being very sweaty.  You pass out (lose consciousness). This information is not intended to replace advice given to you by your health care provider. Make sure you discuss any questions you have with your health care provider. Document Released: 08/24/2007 Document Revised: 04/01/2015 Document Reviewed: 12/26/2014 Elsevier Interactive Patient Education  2017 [employer] Uterine Fibroids Uterine fibroids are tissue masses (tumors). They are also  called leiomyomas. They can develop inside of a womans womb (uterus). They can grow very large. Fibroids are not cancerous (benign). Most fibroids do not require medical treatment. Follow these instructions at home:  Keep all follow-up visits as told by your doctor. This is important.  Take medicines only as told by your doctor.  If you were prescribed a hormone treatment, take the hormone medicines exactly as told.  Do not take aspirin. It can cause bleeding.  Ask your doctor about taking iron pills and increasing the amount of dark green, leafy vegetables in your diet. These actions can help to boost your blood iron levels.  Pay close attention to your period. Tell your doctor about any changes, such as:  Increased blood flow. This may require you to use more pads or tampons than usual per month.  A change in the number of days that your period lasts per month.  A change in symptoms that come with your period, such as back pain or cramping in your belly area (abdomen). Contact a doctor if:  You have pain in your back or the area between your hip bones (pelvic area) that is not controlled by medicines.  You have pain in your abdomen that is not controlled with medicines.  You have an increase in bleeding between and during periods.  You soak tampons or pads in a half hour or less.  You feel lightheaded.  You feel extra tired.  You feel weak. Get help right away if:  You pass  out (faint).  You have a sudden increase in pelvic pain. This information is not intended to replace advice given to you by your health care provider. Make sure you discuss any questions you have with your health care provider. Document Released: 04/09/2010 Document Revised: 11/06/2015 Document Reviewed: 09/03/2013 Elsevier Interactive Patient Education  2017 [employer].

## 2016-07-04 NOTE — MAU Provider Note (Signed)
History     CSN: [id number]  Arrival date and time: 07/04/16 1209   First Provider Initiated Contact with Patient 07/04/16 1345      Chief Complaint  Patient presents with  . Abdominal Pain   Non-pregnant female here with lower abdominal pain x4 days. Describes as sharp and crampy. She has tried Ibuprofen 800 mg and heat but had no relief. Rates pain 8.5/10. Was seen in ED a few weeks ago for similar pain and was told she has uterine fibroids. No fevers. No urinary sx. No vaginal discharge.   Pertinent Gynecological History: Menses: 07/01/16 Contraception: none Blood transfusions: 2008 Sexually transmitted diseases: past history: CT-2011 Previous GYN Procedures: myomectomy-2014, 2015  Last pap: 2017 ?nml  Past Medical History:  Diagnosis Date  . Anemia   . Asthma   . Diabetes mellitus without complication ([place])   . GERD (gastroesophageal reflux disease)   . Pancreatic insufficiency 2015    Past Surgical History:  Procedure Laterality Date  . CERVICAL BIOPSY  W/ LOOP ELECTRODE EXCISION    . cervical tumor removed    . DILATION AND CURETTAGE OF UTERUS  2012  . LEEP    . ROBOT ASSISTED MYOMECTOMY N/A 08/23/2013   Procedure: ROBOTIC ASSISTED MYOMECTOMY;  Surgeon: Lahoma Crocker, MD;  Location: [facility];  Service: Gynecology;  Laterality: N/A;  . WHIPPLE PROCEDURE  2008    Family History  Problem Relation Age of Onset  . Stroke Mother   . Hypertension Mother   . Diabetes Father   . Hypertension Maternal Grandmother   . Dementia Maternal Grandmother   . Heart disease Maternal Grandmother   . Bone cancer Maternal Grandfather   . Diabetes Paternal Grandmother   . Hypertension Paternal Grandmother   . Heart disease Paternal Grandmother   . Prostate cancer Paternal Grandfather   . Breast cancer Paternal Aunt   . Colon cancer Neg Hx   . Colon polyps Neg Hx   . Esophageal cancer Neg Hx     Social History  Substance Use Topics  . Smoking status: Former Smoker   Types: Cigarettes    Quit date: 03/21/2006  . Smokeless tobacco: Never Used  . Alcohol use 0.0 oz/week     Comment: occasionally    Allergies:  Allergies  Allergen Reactions  . Sulfa Antibiotics Other (See Comments)    Causes headache.  . Amoxicillin Hives and Rash    Has patient had a PCN reaction causing immediate rash, facial/tongue/throat swelling, SOB or lightheadedness with hypotension:Yes Has patient had a PCN reaction causing severe rash involving mucus membranes or skin necrosis:Yes Has patient had a PCN reaction that required hospitalization:No Has patient had a PCN reaction occurring within the last 10 years:Yes If all of the above answers are "NO", then may proceed with Cephalosporin use.   Cathie Olden Rash    Prescriptions Prior to Admission  Medication Sig Dispense Refill Last Dose  . Cholecalciferol (VITAMIN D PO) Take 6,000 Units by mouth daily.   Past Week at Unknown time  . citalopram (CELEXA) 40 MG tablet TAKE 1 TABLET BY MOUTH EVERY DAY 30 tablet 3 07/03/2016 at Unknown time  . ibuprofen (ADVIL,MOTRIN) 200 MG tablet Take 800 mg by mouth every 6 (six) hours as needed for moderate pain or cramping.   07/03/2016 at Unknown time  . insulin NPH Human (HUMULIN N,NOVOLIN N) 100 UNIT/ML injection Inject 60 Units into the skin daily with supper.   07/03/2016 at Unknown time  . Ketorolac Tromethamine (SPRIX) 15.75  MG/SPRAY SOLN Place 1 spray into the nose every 8 (eight) hours as needed (FOR CRAMPS).   Past Month at Unknown time  . omeprazole (PRILOSEC) 40 MG capsule Take 40 mg by mouth daily as needed (indigestion).    Past Month at Unknown time  . Pancrelipase, Lip-Prot-Amyl, (ZENPEP) 40000 units CPEP Take 2 capsules by mouth 4 (four) times daily -  with meals and at bedtime. Dose is 2 capsules with meals and 1 capsule with snacks (Patient taking differently: Take 2 capsules by mouth 4 (four) times daily -  with meals and at bedtime. ) 100 capsule 11 Past Week at Unknown time  .  rizatriptan (MAXALT) 10 MG tablet Take 1 tablet (10 mg total) by mouth as needed for migraine. May repeat in 2 hours if needed 10 tablet 3 Past Month at Unknown time  . topiramate (TOPAMAX) 50 MG tablet TAKE 1 TABLET BY MOUTH TWICE DAILY (Patient taking differently: TAKE 1 TABLET BY MOUTH TWICE DAILY AS NEEDED FOR MIGRAINES) 60 tablet 0 Past Month at Unknown time  . albuterol (PROVENTIL HFA;VENTOLIN HFA) 108 (90 BASE) MCG/ACT inhaler Inhale 1-2 puffs into the lungs every 6 (six) hours as needed for wheezing or shortness of breath. 1 Inhaler 0 RESCUE  . glucose blood test strip 1 each by Other route 2 (two) times daily. And lancets 2/day 100 each 12 Taking  . Insulin Pen Needle 31G X 5 MM MISC Use to inject insulin 1 time per day. 100 each 2 Taking    Review of Systems  Constitutional: Positive for chills (yesterday). Negative for fever.  Gastrointestinal: Positive for abdominal pain.  Genitourinary: Positive for vaginal bleeding. Negative for dysuria, frequency, hematuria, urgency and vaginal discharge.   Physical Exam   Blood pressure 117/63, pulse 70, temperature 98.9 F (37.2 C), temperature source Oral, resp. rate 18, height 5' 9.5" (1.765 m), weight 105.7 kg (233 lb), last menstrual period 07/01/2016, SpO2 100 %.  Physical Exam  Nursing note and vitals reviewed. Constitutional: She is oriented to person, place, and time. She appears well-developed and well-nourished. No distress (appears comfortable).  HENT:  Head: Normocephalic and atraumatic.  Neck: Normal range of motion.  Cardiovascular: Normal rate.   Respiratory: Effort normal.  GI: Soft. She exhibits distension. She exhibits no mass. There is tenderness in the suprapubic area. There is no rebound and no guarding.  Genitourinary:  Genitourinary Comments: External: no lesions or erythema Vagina: rugated, nulli, small bloody discharge Uterus: + enlarged, anteverted, + tender, no CMT Adnexae: no masses, no tenderness left, no  tenderness right   Musculoskeletal: Normal range of motion.  Neurological: She is alert and oriented to person, place, and time.  Skin: Skin is warm and dry.  Psychiatric: She has a normal mood and affect.   Results for orders placed or performed during the hospital encounter of 07/04/16 (from the past 24 hour(s))  Urinalysis, Routine w reflex microscopic     Status: Abnormal   Collection Time: 07/04/16 12:20 PM  Result Value Ref Range   Color, Urine STRAW (A) YELLOW   APPearance CLEAR CLEAR   Specific Gravity, Urine 1.008 1.005 - 1.030   pH 6.0 5.0 - 8.0   Glucose, UA NEGATIVE NEGATIVE mg/dL   Hgb urine dipstick LARGE (A) NEGATIVE   Bilirubin Urine NEGATIVE NEGATIVE   Ketones, ur NEGATIVE NEGATIVE mg/dL   Protein, ur NEGATIVE NEGATIVE mg/dL   Nitrite NEGATIVE NEGATIVE   Leukocytes, UA NEGATIVE NEGATIVE   RBC / HPF TOO NUMEROUS TO  COUNT 0 - 5 RBC/hpf   WBC, UA 0-5 0 - 5 WBC/hpf   Bacteria, UA NONE SEEN NONE SEEN   Squamous Epithelial / LPF 0-5 (A) NONE SEEN   Mucous PRESENT   Pregnancy, urine POC     Status: None   Collection Time: 07/04/16 12:40 PM  Result Value Ref Range   Preg Test, Ur NEGATIVE NEGATIVE  Wet prep, genital     Status: Abnormal   Collection Time: 07/04/16  1:57 PM  Result Value Ref Range   Yeast Wet Prep HPF POC NONE SEEN NONE SEEN   Trich, Wet Prep NONE SEEN NONE SEEN   Clue Cells Wet Prep HPF POC NONE SEEN NONE SEEN   WBC, Wet Prep HPF POC FEW (A) NONE SEEN   Sperm NONE SEEN   CBC     Status: Abnormal   Collection Time: 07/04/16  2:14 PM  Result Value Ref Range   WBC 8.4 4.0 - 10.5 K/uL   RBC 3.95 3.87 - 5.11 MIL/uL   Hemoglobin 11.1 (L) 12.0 - 15.0 g/dL   HCT 33.9 (L) 36.0 - 46.0 %   MCV 85.8 78.0 - 100.0 fL   MCH 28.1 26.0 - 34.0 pg   MCHC 32.7 30.0 - 36.0 g/dL   RDW 14.4 11.5 - 15.5 %   Platelets 198 150 - 400 K/uL   MAU Course  Procedures Toradol 30 mg IM   MDM Labs ordered and reviewed. Review of chart shows CT scan on 06/09/16 which  showed multiple small uterine fibroids. No evidence of acute abdominal or pelvic process. Pain improved after medication. Will schedule oupt pelvic US and f/u. Stable for discharge home.  Assessment and Plan   1. Pelvic pain   2. Uterine leiomyoma, unspecified location    Discharge home Pelvic US Follow up in [place] 1 week after [place] Rx Naprosyn Heat prn  Allergies as of 07/04/2016      Reactions   Sulfa Antibiotics Other (See Comments)   Causes headache.   Amoxicillin Hives, Rash   Has patient had a PCN reaction causing immediate rash, facial/tongue/throat swelling, SOB or lightheadedness with hypotension:Yes Has patient had a PCN reaction causing severe rash involving mucus membranes or skin necrosis:Yes Has patient had a PCN reaction that required hospitalization:No Has patient had a PCN reaction occurring within the last 10 years:Yes If all of the above answers are "NO", then may proceed with Cephalosporin use.   Silver Rash      Medication List    TAKE these medications   albuterol 108 (90 Base) MCG/ACT inhaler Commonly known as:  PROVENTIL HFA;VENTOLIN HFA Inhale 1-2 puffs into the lungs every 6 (six) hours as needed for wheezing or shortness of breath.   citalopram 40 MG tablet Commonly known as:  CELEXA TAKE 1 TABLET BY MOUTH EVERY DAY   glucose blood test strip 1 each by Other route 2 (two) times daily. And lancets 2/day   ibuprofen 200 MG tablet Commonly known as:  ADVIL,MOTRIN Take 800 mg by mouth every 6 (six) hours as needed for moderate pain or cramping.   insulin NPH Human 100 UNIT/ML injection Commonly known as:  HUMULIN N,NOVOLIN N Inject 60 Units into the skin daily with supper.   Insulin Pen Needle 31G X 5 MM Misc Use to inject insulin 1 time per day.   naproxen 500 MG tablet Commonly known as:  NAPROSYN Take 1 tablet (500 mg total) by mouth 2 (two) times daily with a meal.  omeprazole 40 MG capsule Commonly known as:  PRILOSEC Take 40 mg by  mouth daily as needed (indigestion).   Pancrelipase (Lip-Prot-Amyl) 40000-136000 units Cpep Commonly known as:  ZENPEP Take 2 capsules by mouth 4 (four) times daily -  with meals and at bedtime. Dose is 2 capsules with meals and 1 capsule with snacks What changed:  additional instructions   rizatriptan 10 MG tablet Commonly known as:  MAXALT Take 1 tablet (10 mg total) by mouth as needed for migraine. May repeat in 2 hours if needed   SPRIX 15.75 MG/SPRAY Soln Generic drug:  Ketorolac Tromethamine Place 1 spray into the nose every 8 (eight) hours as needed (FOR CRAMPS).   topiramate 50 MG tablet Commonly known as:  TOPAMAX TAKE 1 TABLET BY MOUTH TWICE DAILY What changed:  See the new instructions.   VITAMIN D PO Take 6,000 Units by mouth daily.      Julianne Handler, CNM 07/04/2016, 1:54 PM

## 2016-07-05 ENCOUNTER — Encounter (INDEPENDENT_AMBULATORY_CARE_PROVIDER_SITE_OTHER): Payer: Self-pay

## 2016-07-12 ENCOUNTER — Ambulatory Visit (HOSPITAL_COMMUNITY)
Admission: RE | Admit: 2016-07-12 | Discharge: 2016-07-12 | Disposition: A | Discharge status: Home / Self Care | Payer: No Typology Code available for payment source | Source: Ambulatory Visit | Attending: Certified Nurse Midwife | Admitting: Certified Nurse Midwife

## 2016-07-12 DIAGNOSIS — D259 Leiomyoma of uterus, unspecified: Secondary | ICD-10-CM | POA: Insufficient documentation

## 2016-07-12 DIAGNOSIS — R102 Pelvic and perineal pain: Secondary | ICD-10-CM | POA: Diagnosis present

## 2016-07-15 LAB — HM PAP SMEAR

## 2016-07-21 ENCOUNTER — Ambulatory Visit (INDEPENDENT_AMBULATORY_CARE_PROVIDER_SITE_OTHER): Payer: No Typology Code available for payment source | Admitting: Endocrinology

## 2016-07-21 ENCOUNTER — Encounter: Payer: Self-pay | Admitting: Endocrinology

## 2016-07-21 VITALS — BP 122/66 | HR 91 | Ht 70.0 in | Wt 232.0 lb

## 2016-07-21 DIAGNOSIS — E1065 Type 1 diabetes mellitus with hyperglycemia: Secondary | ICD-10-CM | POA: Diagnosis not present

## 2016-07-21 DIAGNOSIS — E108 Type 1 diabetes mellitus with unspecified complications: Secondary | ICD-10-CM | POA: Diagnosis not present

## 2016-07-21 DIAGNOSIS — IMO0002 Reserved for concepts with insufficient information to code with codable children: Secondary | ICD-10-CM

## 2016-07-21 LAB — POCT GLYCOSYLATED HEMOGLOBIN (HGB A1C): HEMOGLOBIN A1C: 9.9

## 2016-07-21 MED ORDER — INSULIN NPH (HUMAN) (ISOPHANE) 100 UNIT/ML ~~LOC~~ SUSP: 70.0000 [IU] | Freq: Every day | SUBCUTANEOUS | 11 refills | Status: DC

## 2016-07-21 NOTE — Progress Notes (Signed)
Subjective:    Patient ID: Kristina Neal, female    DOB: 06-Oct-1975, 41 y.o.   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   Dx'ed: 6378 Complications: none.  Therapy: insulin since 2011, when she had DKA.   GDM: never.  DKA: twice (2011 and 2014).  Severe hypoglycemia: last time was Jan of 2018 Pancreatitis: never.   Other: due to noncompliance, she is on a QD insulin schedule.  She now works 1st shift, M-F, in an office.  She stopped V-GO pump, due to cost; she says cbg on work days is similar to days off.   Interval history: She often does not take insulin until later in the day, but she seldom misses it altogether.  pt states she feels well in general.  She has mild hypoglycemia, fasting, approx once per week Past Medical History:  Diagnosis Date  . Anemia   . Asthma   . Diabetes mellitus without complication ([place])   . GERD (gastroesophageal reflux disease)   . Pancreatic insufficiency 2015    Past Surgical History:  Procedure Laterality Date  . CERVICAL BIOPSY  W/ LOOP ELECTRODE EXCISION    . cervical tumor removed    . DILATION AND CURETTAGE OF UTERUS  2012  . LEEP    . ROBOT ASSISTED MYOMECTOMY N/A 08/23/2013   Procedure: ROBOTIC ASSISTED MYOMECTOMY;  Surgeon: Lahoma Crocker, MD;  Location: [facility];  Service: Gynecology;  Laterality: N/A;  . WHIPPLE PROCEDURE  2008    Social History   Social History  . Marital status: Legally Separated    Spouse name: N/A  . Number of children: 0  . Years of education: N/A   Occupational History  . [employer]    Social History Main Topics  . Smoking status: Former Smoker    Types: Cigarettes    Quit date: 03/21/2006  . Smokeless tobacco: Never Used  . Alcohol use 0.0 oz/week     Comment: occasionally  . Drug use: No  . Sexual activity: Not Currently    Partners: Male    Birth control/ protection: None    Other Topics Concern  . Not on file   Social History Narrative  . No narrative on file    Current Outpatient Prescriptions on File Prior to Visit  Medication Sig Dispense Refill  . albuterol (PROVENTIL HFA;VENTOLIN HFA) 108 (90 BASE) MCG/ACT inhaler Inhale 1-2 puffs into the lungs every 6 (six) hours as needed for wheezing or shortness of breath. 1 Inhaler 0  . Cholecalciferol (VITAMIN D PO) Take 6,000 Units by mouth daily.    . citalopram (CELEXA) 40 MG tablet TAKE 1 TABLET BY MOUTH EVERY DAY 30 tablet 3  . glucose blood test strip 1 each by Other route 2 (two) times daily. And lancets 2/day 100 each 12  . ibuprofen (ADVIL,MOTRIN) 200 MG tablet Take 800 mg by mouth every 6 (six) hours as needed for moderate pain or cramping.    . Insulin Pen Needle 31G X 5 MM MISC Use to inject insulin 1 time per day. 100 each 2  . Ketorolac Tromethamine (SPRIX) 15.75 MG/SPRAY SOLN Place 1 spray into the nose every 8 (eight) hours as needed (FOR CRAMPS).    . naproxen (NAPROSYN) 500 MG tablet Take 1 tablet (500 mg total) by mouth 2 (two) times daily with a meal. 30 tablet 0  . omeprazole (  PRILOSEC) 40 MG capsule Take 40 mg by mouth daily as needed (indigestion).     . Pancrelipase, Lip-Prot-Amyl, (ZENPEP) 40000 units CPEP Take 2 capsules by mouth 4 (four) times daily -  with meals and at bedtime. Dose is 2 capsules with meals and 1 capsule with snacks (Patient taking differently: Take 2 capsules by mouth 4 (four) times daily -  with meals and at bedtime. ) 100 capsule 11  . rizatriptan (MAXALT) 10 MG tablet Take 1 tablet (10 mg total) by mouth as needed for migraine. May repeat in 2 hours if needed 10 tablet 3  . topiramate (TOPAMAX) 50 MG tablet TAKE 1 TABLET BY MOUTH TWICE DAILY (Patient taking differently: TAKE 1 TABLET BY MOUTH TWICE DAILY AS NEEDED FOR MIGRAINES) 60 tablet 0   No current facility-administered medications on file prior to visit.     Allergies  Allergen Reactions  . Sulfa  Antibiotics Other (See Comments)    Causes headache.  . Amoxicillin Hives and Rash    Has patient had a PCN reaction causing immediate rash, facial/tongue/throat swelling, SOB or lightheadedness with hypotension:Yes Has patient had a PCN reaction causing severe rash involving mucus membranes or skin necrosis:Yes Has patient had a PCN reaction that required hospitalization:No Has patient had a PCN reaction occurring within the last 10 years:Yes If all of the above answers are "NO", then may proceed with Cephalosporin use.   Cathie Olden Rash    Family History  Problem Relation Age of Onset  . Stroke Mother   . Hypertension Mother   . Diabetes Father   . Hypertension Maternal Grandmother   . Dementia Maternal Grandmother   . Heart disease Maternal Grandmother   . Bone cancer Maternal Grandfather   . Diabetes Paternal Grandmother   . Hypertension Paternal Grandmother   . Heart disease Paternal Grandmother   . Prostate cancer Paternal Grandfather   . Breast cancer Paternal Aunt   . Colon cancer Neg Hx   . Colon polyps Neg Hx   . Esophageal cancer Neg Hx     BP 122/66   Pulse 91   Ht 5\' 10"  (1.778 m)   Wt 232 lb (105.2 kg)   LMP 07/01/2016   SpO2 96%   BMI 33.29 kg/m   Review of Systems She denies LOC    Objective:   Physical Exam VITAL SIGNS:  See vs page GENERAL: no distress Pulses: dorsalis pedis intact bilat.   MSK: no deformity of the feet CV: no leg edema Skin:  no ulcer on the feet.  normal color and temp on the feet. Neuro: sensation is intact to touch on the feet.    a1c=9.9%    Assessment & Plan:  Insulin-requiring type 2 DM: worse.   Noncompliance with cbg recording and timing of insulin injections.   Patient Instructions  check your blood sugar twice a day.  vary the time of day when you check, between before the 3 meals, and at bedtime.  also check if you have symptoms of your blood sugar being too high or too low.  please keep a record of the readings  and bring it to your next appointment here.  You can write it on any piece of paper.  please call us sooner if your blood sugar goes below 70, or if you have a lot of readings over 200.   In view of your medical condition, you should avoid pregnancy until we have decided it is safe.   Please increase the  NPH insulin to 70 units each morning It is really important to take it in the morning, to prevent the blood sugar from going low in the early hours of the next morning.   On this type of insulin schedule, you should eat meals on a regular schedule (especially lunch).  If a meal is missed or significantly delayed, your blood sugar could go low.   Please come back for a follow-up appointment in 2 months.

## 2016-07-21 NOTE — Patient Instructions (Addendum)
check your blood sugar twice a day.  vary the time of day when you check, between before the 3 meals, and at bedtime.  also check if you have symptoms of your blood sugar being too high or too low.  please keep a record of the readings and bring it to your next appointment here.  You can write it on any piece of paper.  please call us sooner if your blood sugar goes below 70, or if you have a lot of readings over 200.   In view of your medical condition, you should avoid pregnancy until we have decided it is safe.   Please increase the NPH insulin to 70 units each morning It is really important to take it in the morning, to prevent the blood sugar from going low in the early hours of the next morning.   On this type of insulin schedule, you should eat meals on a regular schedule (especially lunch).  If a meal is missed or significantly delayed, your blood sugar could go low.   Please come back for a follow-up appointment in 2 months.

## 2016-08-05 ENCOUNTER — Ambulatory Visit (INDEPENDENT_AMBULATORY_CARE_PROVIDER_SITE_OTHER): Payer: No Typology Code available for payment source | Admitting: Family Medicine

## 2016-08-05 ENCOUNTER — Encounter: Payer: Self-pay | Admitting: Family Medicine

## 2016-08-05 VITALS — BP 120/62 | HR 77 | Temp 98.0°F | Resp 18 | Ht 70.0 in | Wt 224.4 lb

## 2016-08-05 DIAGNOSIS — E1065 Type 1 diabetes mellitus with hyperglycemia: Secondary | ICD-10-CM | POA: Diagnosis not present

## 2016-08-05 DIAGNOSIS — E108 Type 1 diabetes mellitus with unspecified complications: Secondary | ICD-10-CM | POA: Diagnosis not present

## 2016-08-05 DIAGNOSIS — Z0000 Encounter for general adult medical examination without abnormal findings: Secondary | ICD-10-CM

## 2016-08-05 DIAGNOSIS — IMO0002 Reserved for concepts with insufficient information to code with codable children: Secondary | ICD-10-CM

## 2016-08-05 LAB — HEPATIC FUNCTION PANEL
ALK PHOS: 73 U/L (ref 39–117)
ALT: 13 U/L (ref 0–35)
AST: 15 U/L (ref 0–37)
Albumin: 4 g/dL (ref 3.5–5.2)
BILIRUBIN DIRECT: 0.1 mg/dL (ref 0.0–0.3)
BILIRUBIN TOTAL: 0.5 mg/dL (ref 0.2–1.2)
TOTAL PROTEIN: 6.8 g/dL (ref 6.0–8.3)

## 2016-08-05 LAB — BASIC METABOLIC PANEL
BUN: 5 mg/dL — AB (ref 6–23)
CO2: 28 meq/L (ref 19–32)
Calcium: 9 mg/dL (ref 8.4–10.5)
Chloride: 100 mEq/L (ref 96–112)
Creatinine, Ser: 0.63 mg/dL (ref 0.40–1.20)
GFR: 134.23 mL/min (ref >60.00)
GLUCOSE: 301 mg/dL — AB (ref 70–99)
POTASSIUM: 4.8 meq/L (ref 3.5–5.1)
SODIUM: 136 meq/L (ref 135–145)

## 2016-08-05 LAB — CBC WITH DIFFERENTIAL/PLATELET
BASOS ABS: 0 10*3/uL (ref 0.0–0.1)
BASOS PCT: 0.6 % (ref 0.0–3.0)
EOS ABS: 0.1 10*3/uL (ref 0.0–0.7)
Eosinophils Relative: 1.4 % (ref 0.0–5.0)
HEMATOCRIT: 37.3 % (ref 36.0–46.0)
Hemoglobin: 12.2 g/dL (ref 12.0–15.0)
LYMPHS ABS: 1.3 10*3/uL (ref 0.7–4.0)
LYMPHS PCT: 17.5 % (ref 12.0–46.0)
MCHC: 32.6 g/dL (ref 30.0–36.0)
MCV: 86.1 fl (ref 78.0–100.0)
Monocytes Absolute: 0.5 10*3/uL (ref 0.1–1.0)
Monocytes Relative: 6.6 % (ref 3.0–12.0)
NEUTROS ABS: 5.7 10*3/uL (ref 1.4–7.7)
NEUTROS PCT: 73.9 % (ref 43.0–77.0)
PLATELETS: 184 10*3/uL (ref 150.0–400.0)
RBC: 4.34 Mil/uL (ref 3.87–5.11)
RDW: 14.5 % (ref 11.5–15.5)
WBC: 7.7 10*3/uL (ref 4.0–10.5)

## 2016-08-05 LAB — LIPID PANEL
CHOLESTEROL: 152 mg/dL (ref 0–200)
HDL: 39.1 mg/dL (ref >39.00)
LDL Cholesterol: 99 mg/dL (ref 0–99)
NonHDL: 112.49
TRIGLYCERIDES: 67 mg/dL (ref 0.0–149.0)
Total CHOL/HDL Ratio: 4
VLDL: 13.4 mg/dL (ref 0.0–40.0)

## 2016-08-05 LAB — MICROALBUMIN / CREATININE URINE RATIO
CREATININE, U: 161.6 mg/dL
MICROALB UR: 1.6 mg/dL (ref 0.0–1.9)
Microalb Creat Ratio: 1 mg/g (ref 0.0–30.0)

## 2016-08-05 LAB — TSH: TSH: 1.15 u[IU]/mL (ref 0.35–4.50)

## 2016-08-05 LAB — VITAMIN D 25 HYDROXY (VIT D DEFICIENCY, FRACTURES): VITD: 12.99 ng/mL — ABNORMAL LOW (ref 30.00–100.00)

## 2016-08-05 NOTE — Assessment & Plan Note (Signed)
Chronic problem.  Due for microalbumin and eye exam.  UTD on foot exam and A1C.  Will follow along w/ Dr [name] and assist as able

## 2016-08-05 NOTE — Progress Notes (Signed)
Subjective:     Patient ID: Kristina Neal, female   DOB: 04/25/1975, 41 y.o.   MRN: [id number]  HPI   Review of Systems     Objective:   Physical Exam     Assessment:          Plan:

## 2016-08-05 NOTE — Patient Instructions (Addendum)
Follow up in 6 months to recheck BP, cholesterol, and weight loss progress We'll notify you of your lab results and make any changes if needed Call and schedule your eye exam Keep up the good work on healthy diet and regular exercise- you're doing great! Call with any questions or concerns Have a great summer!!!

## 2016-08-05 NOTE — Progress Notes (Signed)
Pre visit review using our clinic review tool, if applicable. No additional management support is needed unless otherwise documented below in the visit note. 

## 2016-08-05 NOTE — Progress Notes (Signed)
   Subjective:    Patient ID: Kristina Neal, female    DOB: [birth year], 41 y.o.   MRN: [id number]  HPI CPE- UTD on GYN, Tdap.  Seeing Dr [name] for DM.  UTD on foot exam.  Due for microalbumin and eye exam.   Review of Systems Patient reports no vision/ hearing changes, adenopathy,fever, weight change,  persistant/recurrent hoarseness , swallowing issues, chest pain, palpitations, edema, persistant/recurrent cough, hemoptysis, dyspnea (rest/exertional/paroxysmal nocturnal), gastrointestinal bleeding (melena, rectal bleeding), abdominal pain, significant heartburn, bowel changes, GU symptoms (dysuria, hematuria, incontinence), Gyn symptoms (abnormal  bleeding, pain),  syncope, focal weakness, memory loss, numbness & tingling, skin/hair/nail changes, abnormal bruising or bleeding, anxiety, or depression.     Objective:   Physical Exam General Appearance:    Alert, cooperative, no distress, appears stated age  Head:    Normocephalic, without obvious abnormality, atraumatic  Eyes:    PERRL, conjunctiva/corneas clear, EOM's intact, fundi    benign, both eyes  Ears:    Normal TM's and external ear canals, both ears  Nose:   Nares normal, septum midline, mucosa normal, no drainage    or sinus tenderness  Throat:   Lips, mucosa, and tongue normal; teeth and gums normal  Neck:   Supple, symmetrical, trachea midline, no adenopathy;    Thyroid: no enlargement/tenderness/nodules  Back:     Symmetric, no curvature, ROM normal, no CVA tenderness  Lungs:     Clear to auscultation bilaterally, respirations unlabored  Chest Wall:    No tenderness or deformity   Heart:    Regular rate and rhythm, S1 and S2 normal, no murmur, rub   or gallop  Breast Exam:    Deferred to GYN  Abdomen:     Soft, non-tender, bowel sounds active all four quadrants,    no masses, no organomegaly  Genitalia:    Deferred to GYN  Rectal:    Extremities:   Extremities normal, atraumatic, no cyanosis or edema    Pulses:   2+ and symmetric all extremities  Skin:   Skin color, texture, turgor normal, no rashes or lesions  Lymph nodes:   Cervical, supraclavicular, and axillary nodes normal  Neurologic:   CNII-XII intact, normal strength, sensation and reflexes    throughout          Assessment & Plan:

## 2016-08-05 NOTE — Assessment & Plan Note (Signed)
Pt's PE WNL w/ exception of obesity.  UTD on GYN, immunizations.  Check labs.  Anticipatory guidance provided.  °

## 2016-08-08 ENCOUNTER — Other Ambulatory Visit: Payer: Self-pay | Admitting: *Deleted

## 2016-08-08 DIAGNOSIS — E559 Vitamin D deficiency, unspecified: Secondary | ICD-10-CM

## 2016-08-08 MED ORDER — VITAMIN D (ERGOCALCIFEROL) 1.25 MG (50000 UNIT) PO CAPS: 50000.0000 [IU] | ORAL_CAPSULE | ORAL | 0 refills | Status: DC

## 2016-08-08 NOTE — Progress Notes (Signed)
Called pt and lmovm to return call.

## 2016-08-11 ENCOUNTER — Encounter: Payer: No Typology Code available for payment source | Admitting: Obstetrics & Gynecology

## 2016-09-08 ENCOUNTER — Other Ambulatory Visit: Payer: Self-pay | Admitting: Family Medicine

## 2016-09-20 ENCOUNTER — Ambulatory Visit: Payer: No Typology Code available for payment source | Admitting: Endocrinology

## 2016-09-23 LAB — HM MAMMOGRAPHY

## 2016-09-27 ENCOUNTER — Ambulatory Visit (HOSPITAL_COMMUNITY): Admission: RE | Admit: 2016-09-27 | Payer: No Typology Code available for payment source | Source: Ambulatory Visit

## 2016-09-28 ENCOUNTER — Encounter (HOSPITAL_COMMUNITY): Payer: Self-pay

## 2016-09-28 ENCOUNTER — Inpatient Hospital Stay (HOSPITAL_COMMUNITY)
Admission: EM | Admit: 2016-09-28 | Discharge: 2016-09-29 | DRG: 638 | Disposition: A | Discharge status: Home / Self Care | Payer: No Typology Code available for payment source | Attending: Internal Medicine | Admitting: Internal Medicine

## 2016-09-28 ENCOUNTER — Emergency Department (HOSPITAL_COMMUNITY): Payer: No Typology Code available for payment source

## 2016-09-28 DIAGNOSIS — E559 Vitamin D deficiency, unspecified: Secondary | ICD-10-CM | POA: Diagnosis present

## 2016-09-28 DIAGNOSIS — Z823 Family history of stroke: Secondary | ICD-10-CM

## 2016-09-28 DIAGNOSIS — Z880 Allergy status to penicillin: Secondary | ICD-10-CM

## 2016-09-28 DIAGNOSIS — E871 Hypo-osmolality and hyponatremia: Secondary | ICD-10-CM | POA: Diagnosis present

## 2016-09-28 DIAGNOSIS — Z90411 Acquired partial absence of pancreas: Secondary | ICD-10-CM | POA: Diagnosis not present

## 2016-09-28 DIAGNOSIS — Z79899 Other long term (current) drug therapy: Secondary | ICD-10-CM

## 2016-09-28 DIAGNOSIS — E875 Hyperkalemia: Secondary | ICD-10-CM | POA: Diagnosis present

## 2016-09-28 DIAGNOSIS — Z8249 Family history of ischemic heart disease and other diseases of the circulatory system: Secondary | ICD-10-CM | POA: Diagnosis not present

## 2016-09-28 DIAGNOSIS — Z9119 Patient's noncompliance with other medical treatment and regimen: Secondary | ICD-10-CM

## 2016-09-28 DIAGNOSIS — R1030 Lower abdominal pain, unspecified: Secondary | ICD-10-CM | POA: Diagnosis not present

## 2016-09-28 DIAGNOSIS — Z91128 Patient's intentional underdosing of medication regimen for other reason: Secondary | ICD-10-CM | POA: Diagnosis not present

## 2016-09-28 DIAGNOSIS — E1110 Type 2 diabetes mellitus with ketoacidosis without coma: Secondary | ICD-10-CM | POA: Diagnosis present

## 2016-09-28 DIAGNOSIS — E872 Acidosis: Secondary | ICD-10-CM | POA: Diagnosis present

## 2016-09-28 DIAGNOSIS — N179 Acute kidney failure, unspecified: Secondary | ICD-10-CM | POA: Diagnosis present

## 2016-09-28 DIAGNOSIS — R000 Tachycardia, unspecified: Secondary | ICD-10-CM | POA: Diagnosis present

## 2016-09-28 DIAGNOSIS — K219 Gastro-esophageal reflux disease without esophagitis: Secondary | ICD-10-CM | POA: Diagnosis present

## 2016-09-28 DIAGNOSIS — J45909 Unspecified asthma, uncomplicated: Secondary | ICD-10-CM | POA: Diagnosis present

## 2016-09-28 DIAGNOSIS — Z91199 Patient's noncompliance with other medical treatment and regimen due to unspecified reason: Secondary | ICD-10-CM

## 2016-09-28 DIAGNOSIS — E1010 Type 1 diabetes mellitus with ketoacidosis without coma: Secondary | ICD-10-CM

## 2016-09-28 DIAGNOSIS — Z794 Long term (current) use of insulin: Secondary | ICD-10-CM | POA: Diagnosis not present

## 2016-09-28 DIAGNOSIS — E860 Dehydration: Secondary | ICD-10-CM | POA: Diagnosis present

## 2016-09-28 DIAGNOSIS — Z87891 Personal history of nicotine dependence: Secondary | ICD-10-CM

## 2016-09-28 DIAGNOSIS — Z833 Family history of diabetes mellitus: Secondary | ICD-10-CM | POA: Diagnosis not present

## 2016-09-28 DIAGNOSIS — D72828 Other elevated white blood cell count: Secondary | ICD-10-CM | POA: Diagnosis not present

## 2016-09-28 DIAGNOSIS — D72829 Elevated white blood cell count, unspecified: Secondary | ICD-10-CM | POA: Diagnosis present

## 2016-09-28 DIAGNOSIS — T383X6A Underdosing of insulin and oral hypoglycemic [antidiabetic] drugs, initial encounter: Secondary | ICD-10-CM | POA: Diagnosis present

## 2016-09-28 DIAGNOSIS — K8689 Other specified diseases of pancreas: Secondary | ICD-10-CM | POA: Diagnosis present

## 2016-09-28 LAB — BASIC METABOLIC PANEL
ANION GAP: 19 — AB (ref 5–15)
ANION GAP: 16 — AB (ref 5–15)
ANION GAP: 10 (ref 5–15)
BUN: 19 mg/dL (ref 6–20)
BUN: 15 mg/dL (ref 6–20)
BUN: 13 mg/dL (ref 6–20)
CHLORIDE: 98 mmol/L — AB (ref 101–111)
CHLORIDE: 104 mmol/L (ref 101–111)
CHLORIDE: 105 mmol/L (ref 101–111)
CO2: 14 mmol/L — AB (ref 22–32)
CO2: 14 mmol/L — ABNORMAL LOW (ref 22–32)
CO2: 19 mmol/L — ABNORMAL LOW (ref 22–32)
Calcium: 8.9 mg/dL (ref 8.9–10.3)
Calcium: 8.6 mg/dL — ABNORMAL LOW (ref 8.9–10.3)
Calcium: 8.5 mg/dL — ABNORMAL LOW (ref 8.9–10.3)
Creatinine, Ser: 0.99 mg/dL (ref 0.44–1.00)
Creatinine, Ser: 0.77 mg/dL (ref 0.44–1.00)
Creatinine, Ser: 0.66 mg/dL (ref 0.44–1.00)
GFR calc Af Amer: >60 mL/min (ref >60)
GFR calc Af Amer: >60 mL/min (ref >60)
GFR calc Af Amer: >60 mL/min (ref >60)
GFR calc non Af Amer: >60 mL/min (ref >60)
GLUCOSE: 193 mg/dL — AB (ref 65–99)
Glucose, Bld: 415 mg/dL — ABNORMAL HIGH (ref 65–99)
Glucose, Bld: 167 mg/dL — ABNORMAL HIGH (ref 65–99)
POTASSIUM: 4.6 mmol/L (ref 3.5–5.1)
POTASSIUM: 4.5 mmol/L (ref 3.5–5.1)
Potassium: 5.2 mmol/L — ABNORMAL HIGH (ref 3.5–5.1)
SODIUM: 134 mmol/L — AB (ref 135–145)
Sodium: 131 mmol/L — ABNORMAL LOW (ref 135–145)
Sodium: 134 mmol/L — ABNORMAL LOW (ref 135–145)

## 2016-09-28 LAB — BLOOD GAS, VENOUS
ACID-BASE DEFICIT: 16.9 mmol/L — AB (ref 0.0–2.0)
Bicarbonate: 10.1 mmol/L — ABNORMAL LOW (ref 20.0–28.0)
O2 SAT: 84 %
PCO2 VEN: 26.9 mmHg — AB (ref 44.0–60.0)
Patient temperature: 98.6
pH, Ven: 7.198 — CL (ref 7.250–7.430)
pO2, Ven: 62 mmHg — ABNORMAL HIGH (ref 32.0–45.0)

## 2016-09-28 LAB — GLUCOSE, CAPILLARY
GLUCOSE-CAPILLARY: 235 mg/dL — AB (ref 65–99)
GLUCOSE-CAPILLARY: 198 mg/dL — AB (ref 65–99)
GLUCOSE-CAPILLARY: 147 mg/dL — AB (ref 65–99)
GLUCOSE-CAPILLARY: 154 mg/dL — AB (ref 65–99)
GLUCOSE-CAPILLARY: 239 mg/dL — AB (ref 65–99)
Glucose-Capillary: 134 mg/dL — ABNORMAL HIGH (ref 65–99)
Glucose-Capillary: 154 mg/dL — ABNORMAL HIGH (ref 65–99)
Glucose-Capillary: 299 mg/dL — ABNORMAL HIGH (ref 65–99)

## 2016-09-28 LAB — URINALYSIS, ROUTINE W REFLEX MICROSCOPIC
BILIRUBIN URINE: NEGATIVE
Bacteria, UA: NONE SEEN
Ketones, ur: 80 mg/dL — AB
LEUKOCYTES UA: NEGATIVE
NITRITE: NEGATIVE
PROTEIN: 30 mg/dL — AB
Specific Gravity, Urine: 1.026 (ref 1.005–1.030)
pH: 5 (ref 5.0–8.0)

## 2016-09-28 LAB — COMPREHENSIVE METABOLIC PANEL
ALK PHOS: 99 U/L (ref 38–126)
ALT: 28 U/L (ref 14–54)
ANION GAP: 26 — AB (ref 5–15)
AST: 28 U/L (ref 15–41)
Albumin: 5.1 g/dL — ABNORMAL HIGH (ref 3.5–5.0)
BILIRUBIN TOTAL: 0.9 mg/dL (ref 0.3–1.2)
BUN: 24 mg/dL — ABNORMAL HIGH (ref 6–20)
CALCIUM: 9.9 mg/dL (ref 8.9–10.3)
CO2: 9 mmol/L — ABNORMAL LOW (ref 22–32)
Chloride: 93 mmol/L — ABNORMAL LOW (ref 101–111)
Creatinine, Ser: 1.14 mg/dL — ABNORMAL HIGH (ref 0.44–1.00)
GFR calc non Af Amer: 59 mL/min — ABNORMAL LOW (ref >60)
Glucose, Bld: 558 mg/dL (ref 65–99)
POTASSIUM: 5.3 mmol/L — AB (ref 3.5–5.1)
Sodium: 128 mmol/L — ABNORMAL LOW (ref 135–145)
TOTAL PROTEIN: 9.7 g/dL — AB (ref 6.5–8.1)

## 2016-09-28 LAB — CBC
HCT: 46.2 % — ABNORMAL HIGH (ref 36.0–46.0)
HEMATOCRIT: 39.3 % (ref 36.0–46.0)
HEMOGLOBIN: 15.8 g/dL — AB (ref 12.0–15.0)
HEMOGLOBIN: 13.3 g/dL (ref 12.0–15.0)
MCH: 28.6 pg (ref 26.0–34.0)
MCH: 27.9 pg (ref 26.0–34.0)
MCHC: 34.2 g/dL (ref 30.0–36.0)
MCHC: 33.8 g/dL (ref 30.0–36.0)
MCV: 83.5 fL (ref 78.0–100.0)
MCV: 82.6 fL (ref 78.0–100.0)
Platelets: 271 10*3/uL (ref 150–400)
Platelets: 253 10*3/uL (ref 150–400)
RBC: 5.53 MIL/uL — AB (ref 3.87–5.11)
RBC: 4.76 MIL/uL (ref 3.87–5.11)
RDW: 13.9 % (ref 11.5–15.5)
RDW: 13.9 % (ref 11.5–15.5)
WBC: 20.5 10*3/uL — AB (ref 4.0–10.5)
WBC: 18.8 10*3/uL — AB (ref 4.0–10.5)

## 2016-09-28 LAB — CBG MONITORING, ED
GLUCOSE-CAPILLARY: 380 mg/dL — AB (ref 65–99)
GLUCOSE-CAPILLARY: 340 mg/dL — AB (ref 65–99)

## 2016-09-28 LAB — I-STAT CG4 LACTIC ACID, ED: Lactic Acid, Venous: 4.47 mmol/L (ref 0.5–1.9)

## 2016-09-28 LAB — RAPID URINE DRUG SCREEN, HOSP PERFORMED
AMPHETAMINES: NOT DETECTED
Barbiturates: NOT DETECTED
Benzodiazepines: NOT DETECTED
Cocaine: NOT DETECTED
Opiates: NOT DETECTED
Tetrahydrocannabinol: NOT DETECTED

## 2016-09-28 LAB — MAGNESIUM: Magnesium: 1.7 mg/dL (ref 1.7–2.4)

## 2016-09-28 LAB — PHOSPHORUS: PHOSPHORUS: 2.2 mg/dL — AB (ref 2.5–4.6)

## 2016-09-28 LAB — MRSA PCR SCREENING: MRSA BY PCR: NEGATIVE

## 2016-09-28 LAB — LIPASE, BLOOD: Lipase: <10 U/L — ABNORMAL LOW (ref 11–51)

## 2016-09-28 LAB — POC URINE PREG, ED: Preg Test, Ur: NEGATIVE

## 2016-09-28 MED ORDER — TOPIRAMATE 25 MG PO TABS: 50.0000 mg | ORAL_TABLET | Freq: Two times a day (BID) | ORAL | Status: DC | PRN

## 2016-09-28 MED ORDER — INSULIN NPH (HUMAN) (ISOPHANE) 100 UNIT/ML ~~LOC~~ SUSP
40.0000 [IU] | Freq: Every day | SUBCUTANEOUS | Status: DC
  Administered 2016-09-28: 40 [IU] via SUBCUTANEOUS
  Filled 2016-09-28: qty 10

## 2016-09-28 MED ORDER — ENOXAPARIN SODIUM 40 MG/0.4ML ~~LOC~~ SOLN
40.0000 mg | SUBCUTANEOUS | Status: DC
  Filled 2016-09-28: qty 0.4

## 2016-09-28 MED ORDER — VITAMIN D2 50 MCG (2000 UT) PO TABS: 6000.0000 [IU] | ORAL_TABLET | Freq: Every day | ORAL | Status: DC

## 2016-09-28 MED ORDER — DEXTROSE-NACL 5-0.45 % IV SOLN: INTRAVENOUS | Status: DC

## 2016-09-28 MED ORDER — VITAMIN D3 25 MCG (1000 UNIT) PO TABS
6000.0000 [IU] | ORAL_TABLET | Freq: Every day | ORAL | Status: DC
  Administered 2016-09-28 – 2016-09-29 (×2): 6000 [IU] via ORAL
  Filled 2016-09-28 (×2): qty 6

## 2016-09-28 MED ORDER — ALBUTEROL SULFATE HFA 108 (90 BASE) MCG/ACT IN AERS: 1.0000 | INHALATION_SPRAY | Freq: Four times a day (QID) | RESPIRATORY_TRACT | Status: DC | PRN

## 2016-09-28 MED ORDER — LACTATED RINGERS IV BOLUS (SEPSIS): 2000.0000 mL | Freq: Once | INTRAVENOUS | Status: DC

## 2016-09-28 MED ORDER — SODIUM CHLORIDE 0.9 % IV SOLN
INTRAVENOUS | Status: DC
  Filled 2016-09-28: qty 1

## 2016-09-28 MED ORDER — ALBUTEROL SULFATE (2.5 MG/3ML) 0.083% IN NEBU: 2.5000 mg | INHALATION_SOLUTION | Freq: Four times a day (QID) | RESPIRATORY_TRACT | Status: DC | PRN

## 2016-09-28 MED ORDER — LACTATED RINGERS IV SOLN: INTRAVENOUS | Status: DC

## 2016-09-28 MED ORDER — SODIUM CHLORIDE 0.9 % IV SOLN
INTRAVENOUS | Status: DC
  Administered 2016-09-28 – 2016-09-29 (×3): via INTRAVENOUS

## 2016-09-28 MED ORDER — SODIUM CHLORIDE 0.9 % IV BOLUS (SEPSIS): 1000.0000 mL | Freq: Once | INTRAVENOUS | Status: DC

## 2016-09-28 MED ORDER — VALACYCLOVIR HCL 500 MG PO TABS
500.0000 mg | ORAL_TABLET | Freq: Every day | ORAL | Status: DC
  Administered 2016-09-28 – 2016-09-29 (×2): 500 mg via ORAL
  Filled 2016-09-28 (×2): qty 1

## 2016-09-28 MED ORDER — INSULIN ASPART 100 UNIT/ML ~~LOC~~ SOLN
0.0000 [IU] | Freq: Three times a day (TID) | SUBCUTANEOUS | Status: DC
  Administered 2016-09-29: 3 [IU] via SUBCUTANEOUS
  Administered 2016-09-29: 2 [IU] via SUBCUTANEOUS

## 2016-09-28 MED ORDER — ACETAMINOPHEN 325 MG PO TABS
650.0000 mg | ORAL_TABLET | Freq: Four times a day (QID) | ORAL | Status: DC | PRN
  Filled 2016-09-28: qty 2

## 2016-09-28 MED ORDER — SODIUM CHLORIDE 0.9 % IV SOLN
INTRAVENOUS | Status: DC
  Administered 2016-09-28: 3.2 [IU]/h via INTRAVENOUS
  Filled 2016-09-28: qty 1

## 2016-09-28 MED ORDER — ONDANSETRON HCL 4 MG/2ML IJ SOLN: 4.0000 mg | Freq: Four times a day (QID) | INTRAMUSCULAR | Status: DC | PRN

## 2016-09-28 MED ORDER — CYCLOBENZAPRINE HCL 5 MG PO TABS: 5.0000 mg | ORAL_TABLET | Freq: Three times a day (TID) | ORAL | Status: DC | PRN

## 2016-09-28 MED ORDER — ONDANSETRON HCL 4 MG/2ML IJ SOLN
4.0000 mg | Freq: Once | INTRAMUSCULAR | Status: AC
  Administered 2016-09-28: 4 mg via INTRAVENOUS
  Filled 2016-09-28: qty 2

## 2016-09-28 MED ORDER — SODIUM CHLORIDE 0.9 % IV SOLN
INTRAVENOUS | Status: AC
  Administered 2016-09-28: 13:00:00 via INTRAVENOUS

## 2016-09-28 MED ORDER — LACTATED RINGERS IV BOLUS (SEPSIS)
2000.0000 mL | Freq: Once | INTRAVENOUS | Status: AC
  Administered 2016-09-28: 2000 mL via INTRAVENOUS

## 2016-09-28 MED ORDER — SODIUM CHLORIDE 0.9 % IV SOLN: INTRAVENOUS | Status: DC

## 2016-09-28 MED ORDER — DEXTROSE-NACL 5-0.45 % IV SOLN
INTRAVENOUS | Status: DC
  Administered 2016-09-28: 15:00:00 via INTRAVENOUS

## 2016-09-28 MED ORDER — CITALOPRAM HYDROBROMIDE 20 MG PO TABS
40.0000 mg | ORAL_TABLET | Freq: Every day | ORAL | Status: DC
  Administered 2016-09-28 – 2016-09-29 (×2): 40 mg via ORAL
  Filled 2016-09-28 (×2): qty 2

## 2016-09-28 MED ORDER — SODIUM CHLORIDE 0.9 % IV BOLUS (SEPSIS)
1000.0000 mL | Freq: Once | INTRAVENOUS | Status: AC
  Administered 2016-09-28: 1000 mL via INTRAVENOUS

## 2016-09-28 MED ORDER — TRAMADOL HCL 50 MG PO TABS
50.0000 mg | ORAL_TABLET | Freq: Four times a day (QID) | ORAL | Status: DC | PRN
  Administered 2016-09-28: 50 mg via ORAL
  Filled 2016-09-28: qty 1

## 2016-09-28 NOTE — H&P (Signed)
History and Physical    Kristina Neal NLG:[id number] DOB: Nov 10, 1975 DOA: 09/28/2016  Referring MD/NP/PA: Dr. Leonides Schanz  PCP: Midge Minium, MD   Patient coming from: home   Chief Complaint:  HPI: Kristina Neal is a 41 y.o. female with known insulin depended DM and pancreatic insufficiency, presented to [facility] ED with main concern of progressive weakness and malaise, poor oral intake, that initially started 3-4 days prior to this admission. Patient reports noticing lower abd area discomfort, mostly throbbing, 3-4/10 in severity, occasionally radiating to epigastric area, aggravated with oral intake and with no specific alleviating factors, associated with nausea and one episodes on non bloody emesis this AM, abd cramping. Patient reports similar event several years ago when she was admitted to the hospital in [place] but she is not sure what was the problem at that time. Pt is not sure when she last checked her SBG's but says she has been taking insulin as prescribed. Patient denies fevers, chills, no dyspnea or chest pain, no recent sick contacts or exposures, no headaches, no visual changes, no specific urinary concerns other than polyuria.   ED Course: Pt clinically stable, VS stable except HR up to 120's. Blood work notable for WBC 20L, Hg 15, Na 128, K, 5.3, Bicarb 9, CBG > 500. AG > 25 and TRH asked to admit to SDU for further evaluation and management of Severe DKA.  Review of Systems:  Constitutional: Negative for fever, chills, diaphoresis HENT: Negative for ear pain, nosebleeds, congestion, facial swelling, rhinorrhea, neck pain, neck stiffness and ear discharge.   Eyes: Negative for pain, discharge, redness, itching and visual disturbance.  Respiratory: Negative for cough, choking, chest tightness, shortness of breath, wheezing and stridor.   Cardiovascular: Negative for chest pain, palpitations and leg swelling.  Gastrointestinal: Negative for abdominal  distention.  Genitourinary: Negative for dysuria, difficulty urinating and dyspareunia.  Musculoskeletal: Negative for back pain, joint swelling, arthralgias and gait problem.  Neurological: Negative for dizziness, tremors, seizures, syncope, facial asymmetry, speech difficulty, light-headedness, numbness and headaches.  Hematological: Negative for adenopathy. Does not bruise/bleed easily.  Psychiatric/Behavioral: Negative for hallucinations, behavioral problems, confusion, dysphoric mood, decreased concentration and agitation.   Past Medical History:  Diagnosis Date  . Anemia   . Asthma   . Diabetes mellitus without complication ([place])   . GERD (gastroesophageal reflux disease)   . Herpes   . Pancreatic insufficiency 2015    Past Surgical History:  Procedure Laterality Date  . CERVICAL BIOPSY  W/ LOOP ELECTRODE EXCISION    . cervical tumor removed    . DILATION AND CURETTAGE OF UTERUS  2012  . LEEP    . ROBOT ASSISTED MYOMECTOMY N/A 08/23/2013   Procedure: ROBOTIC ASSISTED MYOMECTOMY;  Surgeon: Lahoma Crocker, MD;  Location: [facility];  Service: Gynecology;  Laterality: N/A;  . WHIPPLE PROCEDURE  2008   Social Hx:  reports that she quit smoking about 10 years ago. Her smoking use included Cigarettes. She has never used smokeless tobacco. She reports that she drinks alcohol. She reports that she does not use drugs.  Allergies  Allergen Reactions  . Sulfa Antibiotics Other (See Comments)    Reaction:  Headaches  . Amoxicillin Hives and Other (See Comments)    Has patient had a PCN reaction causing immediate rash, facial/tongue/throat swelling, SOB or lightheadedness with hypotension: No Has patient had a PCN reaction causing severe rash involving mucus membranes or skin necrosis: No Has patient had a PCN reaction that required hospitalization:  No Has patient had a PCN reaction occurring within the last 10 years: Yes If all of the above answers are "NO", then may proceed with  Cephalosporin use.  Cathie Olden Rash    Family History  Problem Relation Age of Onset  . Stroke Mother   . Hypertension Mother   . Diabetes Father   . Hypertension Maternal Grandmother   . Dementia Maternal Grandmother   . Heart disease Maternal Grandmother   . Bone cancer Maternal Grandfather   . Diabetes Paternal Grandmother   . Hypertension Paternal Grandmother   . Heart disease Paternal Grandmother   . Prostate cancer Paternal Grandfather   . Breast cancer Paternal Aunt   . Colon cancer Neg Hx   . Colon polyps Neg Hx   . Esophageal cancer Neg Hx     Prior to Admission medications   Medication Sig Start Date End Date Taking? Authorizing Provider  albuterol (PROVENTIL HFA;VENTOLIN HFA) 108 (90 BASE) MCG/ACT inhaler Inhale 1-2 puffs into the lungs every 6 (six) hours as needed for wheezing or shortness of breath. 02/05/14  Yes Elnora Morrison, MD  citalopram (CELEXA) 40 MG tablet TAKE 1 TABLET BY MOUTH EVERY DAY 09/08/16  Yes Midge Minium, MD  cyclobenzaprine (FLEXERIL) 5 MG tablet Take 5-10 mg by mouth 3 (three) times daily as needed for muscle spasms.   Yes [provider]  Ergocalciferol (VITAMIN D2) 2000 units TABS Take 6,000 Units by mouth daily.   Yes [provider]  ibuprofen (ADVIL,MOTRIN) 200 MG tablet Take 800 mg by mouth every 6 (six) hours as needed for fever, headache, mild pain, moderate pain or cramping.    Yes [provider]  insulin NPH Human (HUMULIN N,NOVOLIN N) 100 UNIT/ML injection Inject 0.7 mLs (70 Units total) into the skin daily with supper. And syringes 1/day 07/21/16  Yes Renato Shin, MD  naproxen (NAPROSYN) 500 MG tablet Take 1 tablet (500 mg total) by mouth 2 (two) times daily with a meal. Patient taking differently: Take 500 mg by mouth 2 (two) times daily as needed for mild pain.  07/04/16 07/04/17 Yes Julianne Handler, CNM  rizatriptan (MAXALT) 10 MG tablet Take 1 tablet (10 mg total) by mouth as needed for migraine. May  repeat in 2 hours if needed 06/10/15  Yes Midge Minium, MD  topiramate (TOPAMAX) 50 MG tablet Take 50 mg by mouth 2 (two) times daily as needed (for migraines).   Yes [provider]  valACYclovir (VALTREX) 500 MG tablet Take 500 mg by mouth daily.   Yes [provider]  Vitamin D, Ergocalciferol, (DRISDOL) 50000 units CAPS capsule Take 1 capsule (50,000 Units total) by mouth every 7 (seven) days. Patient taking differently: Take 50,000 Units by mouth every Thursday.  08/08/16  Yes Midge Minium, MD    Physical Exam: Vitals:   09/28/16 0513 09/28/16 0655 09/28/16 0730 09/28/16 0800  BP: 123/89 132/90 (!) 147/91 (!) 136/91  Pulse: (!) 134 (!) 110 (!) 112 (!) 117  Resp: 20 18    Temp: 98.1 F (36.7 C)     TempSrc: Oral     SpO2: 98% 99% 97% 98%    Constitutional: NAD, calm Vitals:   09/28/16 0513 09/28/16 0655 09/28/16 0730 09/28/16 0800  BP: 123/89 132/90 (!) 147/91 (!) 136/91  Pulse: (!) 134 (!) 110 (!) 112 (!) 117  Resp: 20 18    Temp: 98.1 F (36.7 C)     TempSrc: Oral     SpO2: 98%  99% 97% 98%   Eyes: PERRL, lids and conjunctivae normal ENMT: Mucous membranes are dry. Posterior pharynx clear of any exudate or lesions.Normal dentition.  Neck: normal, supple, no masses, no thyromegaly Respiratory: clear to auscultation bilaterally, no wheezing, no crackles. Normal respiratory effort. No accessory muscle use.  Cardiovascular: Regular rate and rhythm, no murmurs / rubs / gallops. No extremity edema. 2+ pedal pulses. No carotid bruits.  Abdomen: mild tenderness in lower quadrants abd area, no masses palpated. No hepatosplenomegaly. Bowel sounds positive.  Musculoskeletal: no clubbing / cyanosis. No joint deformity upper and lower extremities. Good ROM, no contractures. Normal muscle tone.  Skin: no rashes, lesions, ulcers. No induration Neurologic: CN 2-12 grossly intact. Sensation intact, DTR normal. Strength 5/5 in all 4.  Psychiatric: Normal  judgment and insight. Alert and oriented x 3. Normal mood.   Labs on Admission: I have personally reviewed following labs and imaging studies  CBC:  Recent Labs Lab 09/28/16 0654  WBC 20.5*  HGB 15.8*  HCT 46.2*  MCV 83.5  PLT 409   Basic Metabolic Panel:  Recent Labs Lab 09/28/16 0654  NA 128*  K 5.3*  CL 93*  CO2 9*  GLUCOSE 558*  BUN 24*  CREATININE 1.14*  CALCIUM 9.9   Liver Function Tests:  Recent Labs Lab 09/28/16 0654  AST 28  ALT 28  ALKPHOS 99  BILITOT 0.9  PROT 9.7*  ALBUMIN 5.1*    Recent Labs Lab 09/28/16 0654  LIPASE <10*   Urine analysis:    Component Value Date/Time   COLORURINE STRAW (A) 09/28/2016 0650   APPEARANCEUR HAZY (A) 09/28/2016 0650   LABSPEC 1.026 09/28/2016 0650   PHURINE 5.0 09/28/2016 0650   GLUCOSEU >=500 (A) 09/28/2016 0650   HGBUR LARGE (A) 09/28/2016 0650   BILIRUBINUR NEGATIVE 09/28/2016 0650   KETONESUR 80 (A) 09/28/2016 0650   PROTEINUR 30 (A) 09/28/2016 0650   UROBILINOGEN 0.2 12/23/2014 0442   NITRITE NEGATIVE 09/28/2016 0650   LEUKOCYTESUR NEGATIVE 09/28/2016 0650   Radiological Exams on Admission: No results found.  EKG: pending   Assessment/Plan:  Principal Problem:   DKA (diabetic ketoacidoses) ([place]), severe - with AG > 25  - admission to SDU for stabilization - place on insulin drip with close monitoring of electrolytes, BMP Q2 hours until gap closes - keep on IVF per protocol - keep NPO until acidosis improves  - once pt medically stable, will need to discuss diabetic regimen and compliance  - will consult with diabetic specialist   Active Problems:   Hyponatremia - in the setting of DKA - keep in IVF as noted above - BMP in AM    AKI (acute kidney injury) ([place]) - pre renal injury from DKA, dehydration - IVF as noted above - BMP in AM    Leukocytosis - with tachycardia and elevated lactic aicd  - suspect this is reactive process from dehydration in the setting of DKA - no  specific infectious etiology elicited as of yet so will hold off on ABX for now - repeat lactic acid to ensure stabilization with IVF  - CBC in AM to makes sure WBC is trending down     Abdominal pain, lower - suspect from DKA - no specific etiology elicited - keep NPO - allow antiemetic and analgesia as needed     Hyperkalemia - from DKA - monitor for now - tele monitoring   DVT prophylaxis: Lovenox SQ Code Status: Full  Family Communication: Pt updated at bedside Disposition Plan:  will likely go home once medically stable  Consults called: None Admission status: Inpatient   Faye Ramsay MD [facility] Pager [phone]  If 7PM-7AM, please contact night-coverage [email or web address] Password [facility]  09/28/2016, 9:19 AM

## 2016-09-28 NOTE — ED Provider Notes (Signed)
[place] DEPT Provider Note   CSN: [id number] Arrival date & time: 09/28/16  0502     History   Chief Complaint Chief Complaint  Patient presents with  . Emesis    HPI Kristina Neal is a 41 y.o. female.  The history is provided by the patient and medical records. No language interpreter was used.   Kristina Neal is a 41 y.o. female  with a PMH of DM, asthma, pancreatic insufficiency who presents to the Emergency Department complaining of feeling bad since Sunday (4 days ago). Patient states that she is not sure what in particular felt bad Sunday, just didn't feel like her usual self. On Monday, she states that she developed lower abdominal cramping and no appetite. She has only had fluids, no solid foods, over the last three days. Her menstrual cycle started on Monday as well. Associated with nausea and weakness. This morning, she had one episode of emesis. Felt feverish but did not check temperature. She felt like this one time in the past when she was in [place] several years ago. She states that they had to admit her to the hospital, but they never found out what was wrong with her. She is unsure what blood sugars have been running. She doesn't remember the last time she checked sugar. No chills, diarrhea, constipation, blood in the stool, vaginal discharge, back pain, dysuria, urinary urgency/frequency, chest pain, cough, congestion or shortness of breath.     Past Medical History:  Diagnosis Date  . Anemia   . Asthma   . Diabetes mellitus without complication ([place])   . GERD (gastroesophageal reflux disease)   . Herpes   . Pancreatic insufficiency 2015    Patient Active Problem List   Diagnosis Date Noted  . DKA (diabetic ketoacidoses) ([place]) 09/28/2016  . Hyponatremia 09/28/2016  . Hyperkalemia 09/28/2016  . AKI (acute kidney injury) ([place]) 09/28/2016  . Leukocytosis 09/28/2016  . Overweight 01/30/2015  . Possible pregnancy 05/22/2014  .  Acute maxillary sinusitis 04/28/2014  . Increased bowel frequency 02/21/2014  . Physical exam 01/22/2014  . Depression 01/22/2014  . Irregular menses 12/12/2013  . Ethmoid sinusitis 12/12/2013  . Migraine 12/12/2013  . DM I (diabetes mellitus, type I), uncontrolled ([place]) 10/03/2013  . Follow-up examination, following unspecified surgery 09/18/2013  . Fibroids, subserous 08/04/2013  . Abdominal pain, lower 08/04/2013  . 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 Candidal vulvovaginitis 04/12/2013    Past Surgical History:  Procedure Laterality Date  . CERVICAL BIOPSY  W/ LOOP ELECTRODE EXCISION    . cervical tumor removed    . DILATION AND CURETTAGE OF UTERUS  2012  . LEEP    . ROBOT ASSISTED MYOMECTOMY N/A 08/23/2013   Procedure: ROBOTIC ASSISTED MYOMECTOMY;  Surgeon: Lahoma Crocker, MD;  Location: [facility];  Service: Gynecology;  Laterality: N/A;  . WHIPPLE PROCEDURE  2008    OB History    Gravida Para Term Preterm AB Living   4       4 0   SAB TAB Ectopic Multiple Live Births   4               Home Medications    Prior to Admission medications   Medication Sig Start Date End Date Taking? Authorizing Provider  albuterol (PROVENTIL HFA;VENTOLIN HFA) 108 (90 BASE) MCG/ACT inhaler Inhale 1-2 puffs into the lungs every 6 (six) hours as needed for wheezing or shortness of breath. 02/05/14  Yes  Elnora Morrison, MD  citalopram (CELEXA) 40 MG tablet TAKE 1 TABLET BY MOUTH EVERY DAY 09/08/16  Yes Midge Minium, MD  cyclobenzaprine (FLEXERIL) 5 MG tablet Take 5-10 mg by mouth 3 (three) times daily as needed for muscle spasms.   Yes [provider]  Ergocalciferol (VITAMIN D2) 2000 units TABS Take 6,000 Units by mouth daily.   Yes [provider]  ibuprofen (ADVIL,MOTRIN) 200 MG tablet Take 800 mg by mouth every 6 (six) hours as needed for fever, headache, mild pain, moderate pain or cramping.    Yes [provider]  insulin NPH Human (HUMULIN N,NOVOLIN N) 100 UNIT/ML injection Inject 0.7 mLs (70 Units total) into the skin daily with supper. And syringes 1/day 07/21/16  Yes Renato Shin, MD  naproxen (NAPROSYN) 500 MG tablet Take 1 tablet (500 mg total) by mouth 2 (two) times daily with a meal. Patient taking differently: Take 500 mg by mouth 2 (two) times daily as needed for mild pain.  07/04/16 07/04/17 Yes Julianne Handler, CNM  rizatriptan (MAXALT) 10 MG tablet Take 1 tablet (10 mg total) by mouth as needed for migraine. May repeat in 2 hours if needed 06/10/15  Yes Midge Minium, MD  topiramate (TOPAMAX) 50 MG tablet Take 50 mg by mouth 2 (two) times daily as needed (for migraines).   Yes [provider]  valACYclovir (VALTREX) 500 MG tablet Take 500 mg by mouth daily.   Yes [provider]  Vitamin D, Ergocalciferol, (DRISDOL) 50000 units CAPS capsule Take 1 capsule (50,000 Units total) by mouth every 7 (seven) days. Patient taking differently: Take 50,000 Units by mouth every Thursday.  08/08/16  Yes Midge Minium, MD    Family History Family History  Problem Relation Age of Onset  . Stroke Mother   . Hypertension Mother   . Diabetes Father   . Hypertension Maternal Grandmother   . Dementia Maternal Grandmother   . Heart disease Maternal Grandmother   . Bone cancer Maternal Grandfather   . Diabetes Paternal Grandmother   . Hypertension Paternal Grandmother   . Heart disease Paternal Grandmother   . Prostate cancer Paternal Grandfather   . Breast cancer Paternal Aunt   . Colon cancer Neg Hx   . Colon polyps Neg Hx   . Esophageal cancer Neg Hx     Social History Social History  Substance Use Topics  . Smoking status: Former Smoker    Types: Cigarettes    Quit date: 03/21/2006  . Smokeless tobacco: Never Used  . Alcohol use 0.0 oz/week     Comment: occasionally     Allergies   Sulfa antibiotics; Amoxicillin; and Silver   Review of  Systems Review of Systems  Constitutional: Positive for appetite change (Diminished) and fever (Subjective). Negative for chills.  Gastrointestinal: Positive for abdominal pain, nausea and vomiting. Negative for blood in stool, constipation and diarrhea.  All other systems reviewed and are negative.    Physical Exam Updated Vital Signs BP 133/82   Pulse (!) 106   Temp 98.1 F (36.7 C) (Oral)   Resp 19   LMP 09/19/2016   SpO2 100%   Physical Exam  Constitutional: She is oriented to person, place, and time. She appears well-developed and well-nourished. No distress.  HENT:  Head: Normocephalic and atraumatic.  Tacky mucous membranes.  Cardiovascular: Normal heart sounds.   No murmur heard. Tachycardic but regular.  Pulmonary/Chest: Effort normal and breath sounds normal. No respiratory distress.  Abdominal: Soft. Bowel  sounds are normal. She exhibits no distension.    Tenderness as depicted in image. No focal area of tenderness. No rebound or guarding.  Musculoskeletal: She exhibits no edema.  Neurological: She is alert and oriented to person, place, and time.  Skin: Skin is warm and dry.  Nursing note and vitals reviewed.    ED Treatments / Results  Labs (all labs ordered are listed, but only abnormal results are displayed) Labs Reviewed  LIPASE, BLOOD - Abnormal; Notable for the following:       Result Value   Lipase <10 (*)    All other components within normal limits  COMPREHENSIVE METABOLIC PANEL - Abnormal; Notable for the following:    Sodium 128 (*)    Potassium 5.3 (*)    Chloride 93 (*)    CO2 9 (*)    Glucose, Bld 558 (*)    BUN 24 (*)    Creatinine, Ser 1.14 (*)    Total Protein 9.7 (*)    Albumin 5.1 (*)    GFR calc non Af Amer 59 (*)    Anion gap 26 (*)    All other components within normal limits  CBC - Abnormal; Notable for the following:    WBC 20.5 (*)    RBC 5.53 (*)    Hemoglobin 15.8 (*)    HCT 46.2 (*)    All other components within  normal limits  URINALYSIS, ROUTINE W REFLEX MICROSCOPIC - Abnormal; Notable for the following:    Color, Urine STRAW (*)    APPearance HAZY (*)    Glucose, UA >=500 (*)    Hgb urine dipstick LARGE (*)    Ketones, ur 80 (*)    Protein, ur 30 (*)    Squamous Epithelial / LPF 0-5 (*)    All other components within normal limits  BLOOD GAS, VENOUS - Abnormal; Notable for the following:    pH, Ven 7.198 (*)    pCO2, Ven 26.9 (*)    pO2, Ven 62.0 (*)    Bicarbonate 10.1 (*)    Acid-base deficit 16.9 (*)    All other components within normal limits  BASIC METABOLIC PANEL - Abnormal; Notable for the following:    Sodium 131 (*)    Potassium 5.2 (*)    Chloride 98 (*)    CO2 14 (*)    Glucose, Bld 415 (*)    Anion gap 19 (*)    All other components within normal limits  I-STAT CG4 LACTIC ACID, ED - Abnormal; Notable for the following:    Lactic Acid, Venous 4.47 (*)    All other components within normal limits  CBG MONITORING, ED - Abnormal; Notable for the following:    Glucose-Capillary 380 (*)    All other components within normal limits  POC URINE PREG, ED    EKG  EKG Interpretation None       Radiology Dg Chest 2 View  Result Date: 09/28/2016 CLINICAL DATA:  Nausea and decreased appetite since 09/24/2016. Leukocytosis today. EXAM: CHEST  2 VIEW COMPARISON:  PA and lateral chest 01/23/2015. FINDINGS: The lungs are clear. Heart size is normal. No pneumothorax or pleural effusion. No focal bony abnormality. IMPRESSION: No acute disease. Electronically Signed   By: Inge Rise M.D.   On: 09/28/2016 09:31    Procedures Procedures (including critical care time)  CRITICAL CARE Performed by: Ozella Almond Bianca Raneri  Total critical care time: 35 minutes  Critical care time was exclusive of separately billable procedures  and treating other patients.  Critical care was necessary to treat or prevent imminent or life-threatening deterioration.  Critical care was time spent  personally by me on the following activities: development of treatment plan with patient and/or surrogate as well as nursing, discussions with consultants, evaluation of patient's response to treatment, examination of patient, obtaining history from patient or surrogate, ordering and performing treatments and interventions, ordering and review of laboratory studies, ordering and review of radiographic studies, pulse oximetry and re-evaluation of patient's condition.   Medications Ordered in ED Medications  insulin regular (NOVOLIN R,HUMULIN R) 100 Units in sodium chloride 0.9 % 100 mL (1 Units/mL) infusion (3.2 Units/hr Intravenous New Bag/Given 09/28/16 1017)  dextrose 5 %-0.45 % sodium chloride infusion (not administered)  lactated ringers bolus 2,000 mL (0 mLs Intravenous Stopped 09/28/16 0957)    Followed by  lactated ringers infusion (not administered)  sodium chloride 0.9 % bolus 1,000 mL (0 mLs Intravenous Stopped 09/28/16 0825)  ondansetron (ZOFRAN) injection 4 mg (4 mg Intravenous Given 09/28/16 0703)     Initial Impression / Assessment and Plan / ED Course  I have reviewed the triage vital signs and the nursing notes.  Pertinent labs & imaging results that were available during my care of the patient were reviewed by me and considered in my medical decision making (see chart for details).    Kristina Neal is a 41 y.o. female who presents to ED for not feeling well, nausea, and lower abdominal cramping. On exam, patient appears dehydrated with dry cracked lips and tacky mucus membranes. Tachycardic but regular rhythm.  Labs concerning for DKA with CO2 of 9, anion gap of 26. UA shows 80 ketones in the urine. Patient does have leukocytosis of 20.5 likely secondary to dehydration and acidosis. She is afebrile with no infectious symptoms. Chest x-ray negative. UA with no nitrites or leuks. Non-surgical abdomen with no focal areas of tenderness. Doubt intra-abdominal infection. The  patient is noted to have a lactate>4. With the current information available to me, I don't think the patient is in septic shock. The lactate>4, is related to DKA. Fluids and glucose stabilizer initiated. VBG with a pH of 7.198. Hospitalist consulted who will admit.   Patient seen by and discussed with Dr. Ralene Bathe who agrees with treatment plan.    Final Clinical Impressions(s) / ED Diagnoses   Final diagnoses:  Leukocytosis  Diabetic ketoacidosis without coma associated with type 1 diabetes mellitus [facility])    New Prescriptions New Prescriptions   No medications on file     Dion Parrow, Ozella Almond, PA-C 09/28/16 1049    Quintella Reichert, MD 09/29/16 1053

## 2016-09-28 NOTE — ED Triage Notes (Signed)
Pt complains of being nauseated since Sunday and she vomited this am,  Pt states she's had no appetite and last ate something on Monday

## 2016-09-28 NOTE — Progress Notes (Signed)
Inpatient Diabetes Program Recommendations  AACE/ADA: New Consensus Statement on Inpatient Glycemic Control (2015)  Target Ranges:  Prepandial:   less than 140 mg/dL      Peak postprandial:   less than 180 mg/dL (1-2 hours)      Critically ill patients:  140 - 180 mg/dL   Results for IMANII, GOSDIN (MRN [id number]) as of 09/28/2016 11:08  Ref. Range 09/28/2016 06:54  Sodium Latest Ref Range: 135 - 145 mmol/L 128 (L)  Potassium Latest Ref Range: 3.5 - 5.1 mmol/L 5.3 (H)  Chloride Latest Ref Range: 101 - 111 mmol/L 93 (L)  CO2 Latest Ref Range: 22 - 32 mmol/L 9 (L)  Glucose Latest Ref Range: 65 - 99 mg/dL 558 (HH)  BUN Latest Ref Range: 6 - 20 mg/dL 24 (H)  Creatinine Latest Ref Range: 0.44 - 1.00 mg/dL 1.14 (H)  Calcium Latest Ref Range: 8.9 - 10.3 mg/dL 9.9  Anion gap Latest Ref Range: 5 - 15  26 (H)    Admit with: DKA  History: Diabetes, Partial Pancreatectomy (2008)  Home DM Meds: NPH Insulin 70 units QAM (see notes from Dr. Zada Girt Endocrinology from 07/21/16)  Current Insulin Orders: IV Insulin drip        MD- Note patient saw her Endocrinologist Dr. Renato Shin with [facility] on 07/21/16.  Was instructed to increase her NPH Insulin to 70 units QAM at that visit.  Not sure why patient is only taking one large dose of insulin per day?  Likely needs multiple daily injections to better control CBGs, however, per Dr. Cordelia Pen notes, pt takes once daily insulin dose due to non-compliance.  Will attempt to speak with patient about her home DM regimen once she is settled in a room.    --Will follow patient during hospitalization--  Wyn Quaker RN, MSN, CDE Diabetes Coordinator Inpatient Glycemic Control Team Team Pager: [phone] (8a-5p)

## 2016-09-29 DIAGNOSIS — D72828 Other elevated white blood cell count: Secondary | ICD-10-CM

## 2016-09-29 DIAGNOSIS — Z91199 Patient's noncompliance with other medical treatment and regimen due to unspecified reason: Secondary | ICD-10-CM

## 2016-09-29 DIAGNOSIS — Z9119 Patient's noncompliance with other medical treatment and regimen: Secondary | ICD-10-CM

## 2016-09-29 DIAGNOSIS — N179 Acute kidney failure, unspecified: Secondary | ICD-10-CM

## 2016-09-29 DIAGNOSIS — E1010 Type 1 diabetes mellitus with ketoacidosis without coma: Principal | ICD-10-CM

## 2016-09-29 DIAGNOSIS — E875 Hyperkalemia: Secondary | ICD-10-CM

## 2016-09-29 LAB — CBC
HCT: 34.9 % — ABNORMAL LOW (ref 36.0–46.0)
Hemoglobin: 11.6 g/dL — ABNORMAL LOW (ref 12.0–15.0)
MCH: 27.1 pg (ref 26.0–34.0)
MCHC: 33.2 g/dL (ref 30.0–36.0)
MCV: 81.5 fL (ref 78.0–100.0)
PLATELETS: 174 10*3/uL (ref 150–400)
RBC: 4.28 MIL/uL (ref 3.87–5.11)
RDW: 14 % (ref 11.5–15.5)
WBC: 9.5 10*3/uL (ref 4.0–10.5)

## 2016-09-29 LAB — HEMOGLOBIN A1C
Hgb A1c MFr Bld: 12.6 % — ABNORMAL HIGH (ref 4.8–5.6)
Mean Plasma Glucose: 315 mg/dL

## 2016-09-29 LAB — GLUCOSE, CAPILLARY
GLUCOSE-CAPILLARY: 232 mg/dL — AB (ref 65–99)
GLUCOSE-CAPILLARY: 159 mg/dL — AB (ref 65–99)
Glucose-Capillary: 165 mg/dL — ABNORMAL HIGH (ref 65–99)

## 2016-09-29 LAB — BASIC METABOLIC PANEL
Anion gap: 9 (ref 5–15)
BUN: 8 mg/dL (ref 6–20)
CALCIUM: 7.8 mg/dL — AB (ref 8.9–10.3)
CHLORIDE: 106 mmol/L (ref 101–111)
CO2: 18 mmol/L — AB (ref 22–32)
Creatinine, Ser: 0.62 mg/dL (ref 0.44–1.00)
GFR calc non Af Amer: >60 mL/min (ref >60)
GLUCOSE: 250 mg/dL — AB (ref 65–99)
Potassium: 3.8 mmol/L (ref 3.5–5.1)
Sodium: 133 mmol/L — ABNORMAL LOW (ref 135–145)

## 2016-09-29 LAB — LACTIC ACID, PLASMA: LACTIC ACID, VENOUS: 0.7 mmol/L (ref 0.5–1.9)

## 2016-09-29 MED ORDER — INSULIN NPH (HUMAN) (ISOPHANE) 100 UNIT/ML ~~LOC~~ SUSP
70.0000 [IU] | Freq: Every day | SUBCUTANEOUS | Status: DC
  Administered 2016-09-29: 70 [IU] via SUBCUTANEOUS

## 2016-09-29 NOTE — Progress Notes (Signed)
[id number] Davis,BSN,RN3,CCM [phone]/[id number] check-Patient is responsible for 50% of drug cost.  Pens are not covered only syringes and vials.  [facility] is cheapest place for medications.

## 2016-09-29 NOTE — Progress Notes (Signed)
Inpatient Diabetes Program Recommendations  AACE/ADA: New Consensus Statement on Inpatient Glycemic Control (2015)  Target Ranges:  Prepandial:   less than 140 mg/dL      Peak postprandial:   less than 180 mg/dL (1-2 hours)      Critically ill patients:  140 - 180 mg/dL   Results for Kristina Neal, Kristina Neal (MRN [id number]) as of 09/29/2016 10:13  Ref. Range 09/29/2016 08:04  Glucose-Capillary Latest Ref Range: 65 - 99 mg/dL 232 (H)   Results for Kristina Neal, Kristina Neal (MRN [id number]) as of 09/29/2016 10:13  Ref. Range 07/21/2016 16:28  Hemoglobin A1C Unknown 9.9     Admit with: DKA  History: Diabetes, Partial Pancreatectomy (2008)  Home DM Meds: NPH Insulin 70 units QAM (see notes from Dr. Zada Girt Endocrinology from 07/21/16)  Current Insulin Orders: NPH Insulin 70 units QAM      Novolog Sensitive Correction Scale/ SSI (0-9 units) TID AC       -Note patient saw her Endocrinologist Dr. Renato Shin with [facility] End[facility] on 07/21/16.  Was instructed to increase her NPH Insulin to 70 units QAM at that visit.  -Not sure why patient is only taking one large dose of insulin per day?  Likely needs multiple daily injections to better control CBGs, however, per Dr. Cordelia Pen notes, pt takes once daily insulin dose due to non-compliance.  -Spoke with patient this AM.  Patient not checking CBGs regularly.  Has CBG meter at home, but told me she doesn't check like she should.  Discussed with patient that the insulin regimen she is using at home is not very physiologic and that she may do better taking more shots of insulin per day with two different insulins versus one large dose of insulin per day.  Patient stated she doesn't have Hypoglycemia at home, but if she isn't checking than she could be having lows and doesn't know.  Uses vial and syringe at home.  Works during the week, but is willing to take more shots of insulin per day if it helps with her CBG  control.  Discussed pt's last A1c of 9.9% with her and discussed how important control of glucose levels is to maintaining her health.  Encouraged pt to check her CBGs at least TID before meals at home.    MD- Patient may do better with different insulin regimen at home.  If she is willing to take 4 shots of insulin per day, we could try her on Lantus once daily plus 3 shots of Novolog with meals.  If she would only be willing to take 2 shots of insulin per day, then perhaps we could switch her to 70/30 insulin BID with meals.  Not sure what her insurance will cover.  Will ask Care management to investigate the cost of Lantus and Novolog versus 70/30 Insulin.  Patient could also get Reli-on 70/30 Insulin at [facility] for $25 per vial out of pocket.      --Will follow patient during hospitalization--  Wyn Quaker RN, MSN, CDE Diabetes Coordinator Inpatient Glycemic Control Team Team Pager: [phone] (8a-5p)

## 2016-09-29 NOTE — Discharge Instructions (Signed)

## 2016-09-29 NOTE — Care Management Note (Signed)
Case Management Note  Patient Details  Name: Ambert Etheleen Valtierra MRN: [id number] Date of Birth: [birth year]  Subjective/Objective:        dka            Action/Plan: Date:  September 29, 2016  Chart reviewed for concurrent status and case management needs.  Will continue to follow patient progress.  Discharge Planning: following for needs  Expected discharge date: [id number]  Velva Harman, BSN, [place], [place]   Expected Discharge Date:   (unknown)               Expected Discharge Plan:  Home/Self Care  In-House Referral:     Discharge planning Services  CM Consult  Post Acute Care Choice:    Choice offered to:     DME Arranged:    DME Agency:     HH Arranged:    [place] Agency:     Status of Service:  In process, will continue to follow  If discussed at Long Length of Stay Meetings, dates discussed:    Additional Comments:  Leeroy Cha, RN 09/29/2016, 9:14 AM

## 2016-09-29 NOTE — Discharge Summary (Addendum)
Physician Discharge Summary  Kristina Neal LGX:[id number] DOB: 1975-05-17 DOA: 09/28/2016  PCP: Midge Minium, MD  Admit date: 09/28/2016 Discharge date: 09/29/2016  Admitted From: Home Disposition:Home  Recommendations for Outpatient Follow-up:  1. Follow up with Endocrinologist Dr. [name] on 7/16,  8:30 AM. 2. Follow-up with PCP as scheduled.  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 She also had lower abdominal discomfort occasionally radiating to epigastric area and aggravated by food intake. She had one episode of nonbloody vomiting on the morning of admission. Also had polyuria but denied dysuria, fevers, chills, headache, dizziness, blurred vision, chest pain, shortness of breath, palpitations, diarrhea or tingling or numbness of extremities. Denies recent travel or sick contact. No recent illness. Patient reported that she is irregular with taking her insulin and also has not been checking her CBG for these past few days. (Until one week back for CBG were  in the range of 200s to 300s.).  In the ED she was tachycardic to 120s. Blood work showed WBC of 20 K, normal hemoglobin, sodium of 128, K  of 5.3, bicarbonate of 9 with anion gap greater than 25 and CBG >500. Patient given IV normal saline bolus and started on insulin drip for DKA and admitted to stepdown unit.  Hospital course   Diabetes ketoacidosis (DKA) ([place]) Uncontrolled type 1 diabetes mellitus due to medication nonadherence. Patient reports that she misses her insulin almost 2 days a week. Patient  has been on insulin since 2011 after she had her first DKA. She follows with endocrinologist Dr. [name] who has her on once a day NPH insulin 70 units due to issue with noncompliance. She was previously on V-GO pump but stopped using it due to cost. As per her endocrinologist note from 5/3 she does have mild hypoglycemia during fasting almost once a week.  Patient received aggressive IV fluids and IV insulin and was monitored in Step down unit. AG corrected during the night and patient showed dramatic improvement by this morning. Insulin drip was discontinued and patient's home regimen of NPH was resumed.   Her CBG has been stable during the day. Patient again emphasized that she should monitor her CBG at least twice a day. Also instructed to monitor her CBG is she has symptoms of hypo-or hyperglycemia. Instructed to keep a log of her CBG results.  Her NPH dose was increased to 70 units every morning by her endocrinologist in May. I would keep her at the current dose since her DKA is  in the setting of medication nonadherence. I  discussed option of switching her NPH to  twice a day but appears that she would not be adherent to this regimen. I would defer this to her endocrinologist. Patient instructed to have frequent small meals and not to miss her  meals since he is on NPH and can cause her hypoglycemia.  She was supposed to follow-up with her endocrinologist early this month but missed her appointment and have instructed her to schedule an appointment within next 2 weeks.   Acute kidney injury Mild. Prerenal secondary to DKA  with severe dehydration. Resolved with IV hydration.  Leukocytosis  Secondary to acute stress. Resolved.  Vitamin D deficiency Continue weekly ergocalciferol. I discontinued her daily vitamin D.  Remaining medical issues are stable.  Consults: None  Procedures: None  Disposition:  patient suspected to require monitoring another day given her severe DKA on presentation. However given her remarkable improvement today, she can be discharged home today.   Discharge Instructions   Allergies as of 09/29/2016      Reactions   Sulfa Antibiotics Other (See Comments)   Reaction:  Headaches   Amoxicillin Hives, Other (See Comments)   Has patient had a PCN reaction causing immediate rash, facial/tongue/throat swelling, SOB or lightheadedness with hypotension: No Has patient had a PCN reaction causing severe rash involving mucus membranes or skin necrosis: No Has patient had a PCN reaction that required hospitalization: No Has patient had a PCN reaction occurring within the last 10 years: Yes If all of the above answers are "NO", then may proceed with Cephalosporin use.   Silver Rash      Medication List    STOP taking these medications   naproxen 500 MG tablet Commonly known as:  NAPROSYN     TAKE these medications   albuterol 108 (90 Base) MCG/ACT inhaler Commonly known as:  PROVENTIL HFA;VENTOLIN HFA Inhale 1-2 puffs into the lungs every 6 (six) hours as needed for wheezing or shortness of breath.   citalopram 40 MG tablet Commonly known as:  CELEXA TAKE 1 TABLET BY MOUTH EVERY DAY   cyclobenzaprine 5 MG tablet Commonly known as:  FLEXERIL Take 5-10 mg by mouth 3 (three) times daily as needed for muscle spasms.   ibuprofen 200 MG tablet Commonly known as:  ADVIL,MOTRIN Take 800 mg by mouth every 6 (six) hours as needed for fever, headache, mild pain, moderate pain or cramping.   insulin NPH Human 100 UNIT/ML injection Commonly known as:  HUMULIN N,NOVOLIN N Inject 0.7  mLs (70 Units total) into the skin daily with supper. And syringes 1/day   rizatriptan 10 MG tablet Commonly known as:  MAXALT Take 1 tablet (10 mg total) by mouth as needed for migraine. May repeat in 2 hours if needed   topiramate 50 MG tablet Commonly known as:  TOPAMAX Take 50 mg by mouth 2 (two) times daily as needed (for migraines).   VALTREX 500 MG tablet Generic drug:  valACYclovir Take 500 mg by mouth daily.   Vitamin D (Ergocalciferol) 50000 units Caps capsule Commonly known as:  DRISDOL Take 1 capsule (50,000 Units total) by mouth every 7 (seven) days. What changed:  when to take this  Another medication with the same name was removed. Continue taking this medication, and follow the directions you see here.      Follow-up Information    Midge Minium, MD. Schedule an appointment as soon as possible for a visit in 2 week(s).   Specialty:  Family Medicine Contact information: [address] [place] [address] [phone]        Renato Shin, MD. Schedule an appointment as soon as possible for a visit in 1 week(s).   Specialty:  Endocrinology Contact information: [address] [place] [address] [phone]          Allergies  Allergen Reactions  . Sulfa Antibiotics Other (See Comments)    Reaction:  Headaches  . Amoxicillin Hives and Other (See Comments)    Has patient had a PCN reaction causing immediate rash, facial/tongue/throat swelling, SOB or lightheadedness with hypotension: No Has patient had a PCN reaction causing severe rash involving mucus membranes or skin necrosis: No Has patient had a PCN reaction that required hospitalization: No Has patient had a PCN reaction occurring within the last 10 years: Yes If all of the above answers are "NO", then may proceed with Cephalosporin use.  Cathie Olden Rash     Procedures/Studies: Dg Chest 2 View  Result Date: 09/28/2016 CLINICAL DATA:  Nausea and decreased  appetite since 09/24/2016. Leukocytosis today. EXAM: CHEST  2 VIEW COMPARISON:  PA and lateral chest 01/23/2015. FINDINGS: The lungs are clear. Heart size is normal. No pneumothorax or pleural effusion. No focal bony abnormality. IMPRESSION: No acute disease. Electronically Signed   By: Inge Rise M.D.   On: 09/28/2016 09:31    Subjective: Still has some weakness but feels better overall. No further nausea or vomiting. Anion gap closely during the night and was taken off insulin drip.  Discharge Exam: Vitals:   09/29/16 1200 09/29/16 1300  BP:  114/70  Pulse:    Resp:  14  Temp: 98.2 F (36.8 C)    Vitals:   09/29/16 0400 09/29/16 0800 09/29/16 1200 09/29/16 1300  BP: 120/61   114/70  Pulse:      Resp: (!) 27   14  Temp:  98.4 F (36.9 C) 98.2 F (36.8 C)   TempSrc:  Oral Oral   SpO2: 96%   100%  Weight:      Height:       Gen.: Middle aged appears fatigued, not in distress HEENT: Moist mucosa, supple neck Chest: Clear to auscultation bilaterally CVS: Normal S1 and S2, no murmurs rub or gallop GI: Soft, nondistended, nontender Musculoskeletal: Warm, no edema     The results of significant diagnostics from this hospitalization (including imaging, microbiology, ancillary and laboratory) are listed below for reference.     Microbiology: Recent Results (from the past 240 hour(s))  MRSA PCR Screening     Status: None   Collection Time: 09/28/16 12:06 PM  Result Value Ref Range Status   MRSA by PCR NEGATIVE NEGATIVE Final    Comment:        The GeneXpert MRSA Assay (FDA approved for NASAL specimens only), is one component of a comprehensive MRSA colonization surveillance program. It is not intended to diagnose MRSA infection nor to guide or monitor treatment for MRSA infections.   Culture, blood (routine x 2)     Status: None (Preliminary result)   Collection Time: 09/28/16  1:02 PM  Result Value Ref Range Status   Specimen Description BLOOD RIGHT  ANTECUBITAL  Final   Special Requests IN PEDIATRIC BOTTLE Blood Culture adequate volume  Final   Culture   Final    NO GROWTH < 24 HOURS Performed at [facility], [place] [address] [place], [place] [address]    Report Status PENDING  Incomplete  Culture, blood (routine x 2)     Status: None (Preliminary result)   Collection Time: 09/28/16  1:15 PM  Result Value Ref Range Status   Specimen Description BLOOD LEFT HAND  Final   Special Requests IN PEDIATRIC BOTTLE  Blood Culture adequate volume  Final   Culture   Final    NO GROWTH < 24 HOURS Performed at [facility], [place] [address], [place], [place] [address]    Report Status PENDING  Incomplete     Labs: BNP (last 3 results) No results for input(s): BNP in the last 8760 hours. Basic Metabolic Panel:  Recent Labs Lab 09/28/16 0654 09/28/16 0959 09/28/16 1315 09/28/16 1632 09/29/16 0256  NA 128* 131* 134* 134* 133*  K 5.3* 5.2* 4.6 4.5 3.8  CL 93* 98* 104 105 106  CO2 9* 14* 14* 19* 18*  GLUCOSE 558* 415* 193* 167* 250*  BUN 24* 19 15 13 8   CREATININE 1.14* 0.99 0.77 0.66 0.62  CALCIUM 9.9 8.9 8.6* 8.5* 7.8*  MG  --   --  1.7  --   --   PHOS  --   --  2.2*  --   --    Liver Function Tests:  Recent Labs Lab 09/28/16 0654  AST 28  ALT 28  ALKPHOS 99  BILITOT 0.9  PROT 9.7*  ALBUMIN 5.1*    Recent Labs Lab 09/28/16 0654  LIPASE <10*   No results for input(s): AMMONIA in the last 168 hours. CBC:  Recent Labs Lab 09/28/16 0654 09/28/16 1315 09/29/16 0256  WBC 20.5* 18.8* 9.5  HGB 15.8* 13.3 11.6*  HCT 46.2* 39.3 34.9*  MCV 83.5 82.6 81.5  PLT 271 253 174   Cardiac Enzymes: No results for input(s): CKTOTAL, CKMB, CKMBINDEX, TROPONINI in the last 168 hours. BNP: Invalid input(s): POCBNP CBG:  Recent Labs Lab 09/28/16 1851 09/28/16 2114 09/28/16 2304 09/29/16 0804 09/29/16 1210  GLUCAP 154* 239* 299* 232* 165*   D-Dimer No results for input(s): DDIMER in the last 72 hours. Hgb  A1c  Recent Labs  09/28/16 1054  HGBA1C 12.6*   Lipid Profile No results for input(s): CHOL, HDL, LDLCALC, TRIG, CHOLHDL, LDLDIRECT in the last 72 hours. Thyroid function studies No results for input(s): TSH, T4TOTAL, T3FREE, THYROIDAB in the last 72 hours.  Invalid input(s): FREET3 Anemia work up No results for input(s): VITAMINB12, FOLATE, FERRITIN, TIBC, IRON, RETICCTPCT in the last 72 hours. Urinalysis    Component Value Date/Time   COLORURINE RED (A) 09/28/2016 1420   APPEARANCEUR HAZY (A) 09/28/2016 1420   LABSPEC  09/28/2016 1420    TEST NOT REPORTED DUE TO COLOR INTERFERENCE OF URINE PIGMENT   PHURINE  09/28/2016 1420    TEST NOT REPORTED DUE TO COLOR INTERFERENCE OF URINE PIGMENT   GLUCOSEU (A) 09/28/2016 1420    TEST NOT REPORTED DUE TO COLOR INTERFERENCE OF URINE PIGMENT   HGBUR (A) 09/28/2016 1420    TEST NOT REPORTED DUE TO COLOR INTERFERENCE OF URINE PIGMENT   BILIRUBINUR (A) 09/28/2016 1420    TEST NOT REPORTED DUE TO COLOR INTERFERENCE OF URINE PIGMENT   KETONESUR (A) 09/28/2016 1420    TEST NOT REPORTED DUE TO COLOR INTERFERENCE OF URINE PIGMENT   PROTEINUR (A) 09/28/2016 1420    TEST NOT REPORTED DUE TO COLOR INTERFERENCE OF URINE PIGMENT   UROBILINOGEN 0.2 12/23/2014 0442   NITRITE (A) 09/28/2016 1420    TEST NOT REPORTED DUE TO COLOR INTERFERENCE OF URINE PIGMENT   LEUKOCYTESUR (A) 09/28/2016 1420    TEST NOT REPORTED DUE TO COLOR INTERFERENCE OF URINE PIGMENT   Sepsis Labs Invalid input(s): PROCALCITONIN,  WBC,  LACTICIDVEN Microbiology Recent Results (from the past 240 hour(s))  MRSA PCR Screening  Status: None   Collection Time: 09/28/16 12:06 PM  Result Value Ref Range Status   MRSA by PCR NEGATIVE NEGATIVE Final    Comment:        The GeneXpert MRSA Assay (FDA approved for NASAL specimens only), is one component of a comprehensive MRSA colonization surveillance program. It is not intended to diagnose MRSA infection nor to guide  or monitor treatment for MRSA infections.   Culture, blood (routine x 2)     Status: None (Preliminary result)   Collection Time: 09/28/16  1:02 PM  Result Value Ref Range Status   Specimen Description BLOOD RIGHT ANTECUBITAL  Final   Special Requests IN PEDIATRIC BOTTLE Blood Culture adequate volume  Final   Culture   Final    NO GROWTH < 24 HOURS Performed at [facility], [place] [address] [place], [place] [address]    Report Status PENDING  Incomplete  Culture, blood (routine x 2)     Status: None (Preliminary result)   Collection Time: 09/28/16  1:15 PM  Result Value Ref Range Status   Specimen Description BLOOD LEFT HAND  Final   Special Requests IN PEDIATRIC BOTTLE Blood Culture adequate volume  Final   Culture   Final    NO GROWTH < 24 HOURS Performed at [facility], [place] [address] [place], [place] [address]    Report Status PENDING  Incomplete     Time coordinating discharge: Over 30 minutes  SIGNED:   Louellen Molder, MD  [facility] 09/29/2016, 3:12 PM Pager   If 7PM-7AM, please contact night-coverage [email or web address] Password [email or web address]

## 2016-09-30 LAB — HEMOGLOBIN A1C
Hgb A1c MFr Bld: 13.1 % — ABNORMAL HIGH (ref 4.8–5.6)
Mean Plasma Glucose: 329 mg/dL

## 2016-10-03 ENCOUNTER — Ambulatory Visit (INDEPENDENT_AMBULATORY_CARE_PROVIDER_SITE_OTHER): Payer: No Typology Code available for payment source | Admitting: Endocrinology

## 2016-10-03 ENCOUNTER — Encounter: Payer: Self-pay | Admitting: Endocrinology

## 2016-10-03 VITALS — BP 122/82 | HR 82 | Ht 70.0 in | Wt 216.0 lb

## 2016-10-03 DIAGNOSIS — IMO0002 Reserved for concepts with insufficient information to code with codable children: Secondary | ICD-10-CM

## 2016-10-03 DIAGNOSIS — E108 Type 1 diabetes mellitus with unspecified complications: Secondary | ICD-10-CM | POA: Diagnosis not present

## 2016-10-03 DIAGNOSIS — E1065 Type 1 diabetes mellitus with hyperglycemia: Secondary | ICD-10-CM

## 2016-10-03 LAB — CULTURE, BLOOD (ROUTINE X 2)
CULTURE: NO GROWTH
Culture: NO GROWTH
SPECIAL REQUESTS: ADEQUATE
Special Requests: ADEQUATE

## 2016-10-03 MED ORDER — INSULIN NPH (HUMAN) (ISOPHANE) 100 UNIT/ML ~~LOC~~ SUSP: 70.0000 [IU] | Freq: Every day | SUBCUTANEOUS | 11 refills | Status: AC

## 2016-10-03 NOTE — Progress Notes (Signed)
Subjective:    Patient ID: Kristina Neal, female    DOB: 11-16-1975, 41 y.o.   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   Dx'ed: 2703 Complications: none.  Therapy: insulin since 2011, when she had DKA.   GDM: never.  DKA: twice (2011 and 2014).  Severe hypoglycemia: last time was Jan of 2018.  Pancreatitis: never.   Other: due to noncompliance, she is on a QD insulin schedule.  She now works 1st shift, M-F, in an office.  She stopped V-GO pump, due to cost; she says cbg on work days is similar to days off.   Interval history: she was recently in the hospital for DKA.  Pt says she sometimes takes novolog.  She does not take NPH.  She does not check cbg's.  She reports headache and poor appetite.   Past Medical History:  Diagnosis Date  . Anemia   . Asthma   . Diabetes mellitus without complication ([place])   . GERD (gastroesophageal reflux disease)   . Herpes   . Pancreatic insufficiency 2015    Past Surgical History:  Procedure Laterality Date  . CERVICAL BIOPSY  W/ LOOP ELECTRODE EXCISION    . cervical tumor removed    . DILATION AND CURETTAGE OF UTERUS  2012  . LEEP    . ROBOT ASSISTED MYOMECTOMY N/A 08/23/2013   Procedure: ROBOTIC ASSISTED MYOMECTOMY;  Surgeon: Lahoma Crocker, MD;  Location: [facility];  Service: Gynecology;  Laterality: N/A;  . WHIPPLE PROCEDURE  2008    Social History   Social History  . Marital status: Legally Separated    Spouse name: N/A  . Number of children: 0  . Years of education: N/A   Occupational History  . [employer]    Social History Main Topics  . Smoking status: Former Smoker    Types: Cigarettes    Quit date: 03/21/2006  . Smokeless tobacco: Never Used  . Alcohol use 0.0 oz/week     Comment: occasionally  . Drug use: No  . Sexual activity: Not Currently    Partners: Male    Birth control/ protection: None    Other Topics Concern  . Not on file   Social History Narrative  . No narrative on file    Current Outpatient Prescriptions on File Prior to Visit  Medication Sig Dispense Refill  . albuterol (PROVENTIL HFA;VENTOLIN HFA) 108 (90 BASE) MCG/ACT inhaler Inhale 1-2 puffs into the lungs every 6 (six) hours as needed for wheezing or shortness of breath. 1 Inhaler 0  . citalopram (CELEXA) 40 MG tablet TAKE 1 TABLET BY MOUTH EVERY DAY 30 tablet 3  . cyclobenzaprine (FLEXERIL) 5 MG tablet Take 5-10 mg by mouth 3 (three) times daily as needed for muscle spasms.    Marland Kitchen ibuprofen (ADVIL,MOTRIN) 200 MG tablet Take 800 mg by mouth every 6 (six) hours as needed for fever, headache, mild pain, moderate pain or cramping.     . rizatriptan (MAXALT) 10 MG tablet Take 1 tablet (10 mg total) by mouth as needed for migraine. May repeat in 2 hours if needed 10 tablet 3  . topiramate (TOPAMAX) 50 MG tablet Take 50 mg by mouth 2 (two) times daily as needed (for migraines).    . valACYclovir (VALTREX) 500 MG tablet Take 500 mg by mouth daily.    . Vitamin D, Ergocalciferol, (DRISDOL) 50000 units  CAPS capsule Take 1 capsule (50,000 Units total) by mouth every 7 (seven) days. (Patient taking differently: Take 50,000 Units by mouth every Thursday. ) 12 capsule 0   No current facility-administered medications on file prior to visit.     Allergies  Allergen Reactions  . Sulfa Antibiotics Other (See Comments)    Reaction:  Headaches  . Amoxicillin Hives and Other (See Comments)    Has patient had a PCN reaction causing immediate rash, facial/tongue/throat swelling, SOB or lightheadedness with hypotension: No Has patient had a PCN reaction causing severe rash involving mucus membranes or skin necrosis: No Has patient had a PCN reaction that required hospitalization: No Has patient had a PCN reaction occurring within the last 10 years: Yes If all of the above answers are "NO", then may proceed with Cephalosporin use.   Cathie Olden Rash    Family History  Problem Relation Age of Onset  . Stroke Mother   . Hypertension Mother   . Diabetes Father   . Hypertension Maternal Grandmother   . Dementia Maternal Grandmother   . Heart disease Maternal Grandmother   . Bone cancer Maternal Grandfather   . Diabetes Paternal Grandmother   . Hypertension Paternal Grandmother   . Heart disease Paternal Grandmother   . Prostate cancer Paternal Grandfather   . Breast cancer Paternal Aunt   . Colon cancer Neg Hx   . Colon polyps Neg Hx   . Esophageal cancer Neg Hx     BP 122/82   Pulse 82   Ht 5\' 10"  (1.778 m)   Wt 216 lb (98 kg)   LMP 09/19/2016   SpO2 97%   BMI 30.99 kg/m   Review of Systems She denies hypoglycemia.      Objective:   Physical Exam VITAL SIGNS:  See vs page GENERAL: no distress Pulses: foot pulses are intact bilaterally.   MSK: no deformity of the feet or ankles.  CV: no edema of the legs or ankles Skin:  no ulcer on the feet or ankles.  normal color and temp on the feet and ankles Neuro: sensation is intact to touch on the feet and ankles.     Lab Results  Component Value Date   HGBA1C 13.1 (H) 09/29/2016      Assessment & Plan:  Type 1 DM: ongoing poor control.   DKA: this means that pt is missing her insulin alltogether, rather than being on one type of insulin regimen rather than the other.     Patient Instructions  check your blood sugar twice a day.  vary the time of day when you check, between before the 3 meals, and at bedtime.  also check if you have symptoms of your blood sugar being too high or too low.  please keep a record of the readings and bring it to your next appointment here.  You can write it on any piece of paper.  please call us sooner if your blood sugar goes below 70, or if you have a lot of readings over 200.   In view of your medical condition, you should avoid pregnancy until we have decided it is safe.   Please change the insulin back to NPH, 70  units each morning.   It is really important to take it in the morning, to prevent the blood sugar from going low in the early hours of the next morning.   On this type of insulin schedule, you should eat meals on a regular schedule (especially  lunch).  If a meal is missed or significantly delayed, your blood sugar could go low.   Please come back for a follow-up appointment in 3 months.

## 2016-10-03 NOTE — Patient Instructions (Addendum)
check your blood sugar twice a day.  vary the time of day when you check, between before the 3 meals, and at bedtime.  also check if you have symptoms of your blood sugar being too high or too low.  please keep a record of the readings and bring it to your next appointment here.  You can write it on any piece of paper.  please call us sooner if your blood sugar goes below 70, or if you have a lot of readings over 200.   In view of your medical condition, you should avoid pregnancy until we have decided it is safe.   Please change the insulin back to NPH, 70 units each morning.   It is really important to take it in the morning, to prevent the blood sugar from going low in the early hours of the next morning.   On this type of insulin schedule, you should eat meals on a regular schedule (especially lunch).  If a meal is missed or significantly delayed, your blood sugar could go low.   Please come back for a follow-up appointment in 3 months.

## 2016-10-04 ENCOUNTER — Encounter: Payer: Self-pay | Admitting: General Practice

## 2016-10-04 ENCOUNTER — Other Ambulatory Visit: Payer: Self-pay

## 2016-10-06 ENCOUNTER — Telehealth: Payer: Self-pay | Admitting: Endocrinology

## 2016-10-06 NOTE — Telephone Encounter (Addendum)
Dismissed from [facility] by Renato Shin MD  On July 16,2018 PWR

## 2016-10-07 ENCOUNTER — Ambulatory Visit (HOSPITAL_COMMUNITY)
Admission: RE | Admit: 2016-10-07 | Discharge: 2016-10-07 | Disposition: A | Discharge status: Home / Self Care | Payer: No Typology Code available for payment source | Source: Ambulatory Visit | Attending: Obstetrics and Gynecology | Admitting: Obstetrics and Gynecology

## 2016-10-07 ENCOUNTER — Encounter (HOSPITAL_COMMUNITY): Payer: Self-pay

## 2016-10-07 DIAGNOSIS — F17210 Nicotine dependence, cigarettes, uncomplicated: Secondary | ICD-10-CM | POA: Diagnosis not present

## 2016-10-07 DIAGNOSIS — Z880 Allergy status to penicillin: Secondary | ICD-10-CM | POA: Insufficient documentation

## 2016-10-07 DIAGNOSIS — Z808 Family history of malignant neoplasm of other organs or systems: Secondary | ICD-10-CM | POA: Insufficient documentation

## 2016-10-07 DIAGNOSIS — Z8249 Family history of ischemic heart disease and other diseases of the circulatory system: Secondary | ICD-10-CM | POA: Insufficient documentation

## 2016-10-07 DIAGNOSIS — Z803 Family history of malignant neoplasm of breast: Secondary | ICD-10-CM | POA: Diagnosis not present

## 2016-10-07 DIAGNOSIS — Z833 Family history of diabetes mellitus: Secondary | ICD-10-CM | POA: Diagnosis not present

## 2016-10-07 DIAGNOSIS — Z882 Allergy status to sulfonamides status: Secondary | ICD-10-CM | POA: Insufficient documentation

## 2016-10-07 DIAGNOSIS — Z9889 Other specified postprocedural states: Secondary | ICD-10-CM | POA: Diagnosis not present

## 2016-10-07 DIAGNOSIS — Z319 Encounter for procreative management, unspecified: Secondary | ICD-10-CM | POA: Diagnosis present

## 2016-10-07 DIAGNOSIS — E1010 Type 1 diabetes mellitus with ketoacidosis without coma: Secondary | ICD-10-CM | POA: Insufficient documentation

## 2016-10-07 DIAGNOSIS — Z823 Family history of stroke: Secondary | ICD-10-CM | POA: Diagnosis not present

## 2016-10-07 NOTE — ED Notes (Signed)
BP 108/64, pulse 73, weight 215.2, pt in with Dr. Lisbeth Renshaw for preconception counseling.

## 2016-10-07 NOTE — Consult Note (Signed)
Maternal Fetal Medicine Consultation  Requesting Provider(s): Kendall Flack, MD  Reason for consultation: Poorly controlled diabetes, preconception counseling  HPI: Kristina Neal is a 41 year old G4P0040 seen for consultation / preconception counseling.  Kristina Neal reports a history of diabetes since 2008.  She underwent a partial pancreatectomy for a benign tumor - has type 1 diabetes either secondary to the procedure or both.  She is currently followed by Dr. Vita Barley from Endocrinology.  Kristina Neal was recently admitted due to DKA - prior to this admission, she had been admitted in DKA on two previous occasions in 2011 and 2014.  She has had some difficulty in controlling her sugars - had severe hypoglycemia in Jan 2018.  She reports that she was best controlled using an insulin pump but that her insurance no longer covers it.  Kristina Neal denies any end-organ damage - no known renal involvement or retinopathy.  Her most recent HbA1C was 13.1.  She is currently on NPH insulin 70 units daily.  She also reports a history of migraine headaches and well-controlled Asthma.  She underwent a laparoscopic myomectomy in 2014.  Kristina Neal reports a history of  4 previous first trimester losses. She is without complaints today.  OB History: OB History    Gravida Para Term Preterm AB Living   4       4 0   SAB TAB Ectopic Multiple Live Births   4              PMH:  Past Medical History:  Diagnosis Date  . Anemia   . Asthma   . Diabetes mellitus without complication ([place])   . GERD (gastroesophageal reflux disease)   . Herpes   . Pancreatic insufficiency 2015    PSH:  Past Surgical History:  Procedure Laterality Date  . CERVICAL BIOPSY  W/ LOOP ELECTRODE EXCISION    . cervical tumor removed    . DILATION AND CURETTAGE OF UTERUS  2012  . LEEP    . ROBOT ASSISTED MYOMECTOMY N/A 08/23/2013   Procedure: ROBOTIC ASSISTED MYOMECTOMY;  Surgeon: Lahoma Crocker, MD;  Location:  [facility];  Service: Gynecology;  Laterality: N/A;  . WHIPPLE PROCEDURE  2008   Meds:  Current Outpatient Prescriptions on File Prior to Encounter  Medication Sig Dispense Refill  . albuterol (PROVENTIL HFA;VENTOLIN HFA) 108 (90 BASE) MCG/ACT inhaler Inhale 1-2 puffs into the lungs every 6 (six) hours as needed for wheezing or shortness of breath. 1 Inhaler 0  . citalopram (CELEXA) 40 MG tablet TAKE 1 TABLET BY MOUTH EVERY DAY 30 tablet 3  . cyclobenzaprine (FLEXERIL) 5 MG tablet Take 5-10 mg by mouth 3 (three) times daily as needed for muscle spasms.    Marland Kitchen ibuprofen (ADVIL,MOTRIN) 200 MG tablet Take 800 mg by mouth every 6 (six) hours as needed for fever, headache, mild pain, moderate pain or cramping.     . insulin NPH Human (HUMULIN N,NOVOLIN N) 100 UNIT/ML injection Inject 0.7 mLs (70 Units total) into the skin daily with supper. And syringes 1/day 30 mL 11  . rizatriptan (MAXALT) 10 MG tablet Take 1 tablet (10 mg total) by mouth as needed for migraine. May repeat in 2 hours if needed 10 tablet 3  . topiramate (TOPAMAX) 50 MG tablet Take 50 mg by mouth 2 (two) times daily as needed (for migraines).    . valACYclovir (VALTREX) 500 MG tablet Take 500 mg by mouth daily.    . Vitamin D, Ergocalciferol, (  DRISDOL) 50000 units CAPS capsule Take 1 capsule (50,000 Units total) by mouth every 7 (seven) days. (Patient taking differently: Take 50,000 Units by mouth every Thursday. ) 12 capsule 0   No current facility-administered medications on file prior to encounter.    Allergies:  Allergies  Allergen Reactions  . Sulfa Antibiotics Other (See Comments)    Reaction:  Headaches  . Amoxicillin Hives and Other (See Comments)    Has patient had a PCN reaction causing immediate rash, facial/tongue/throat swelling, SOB or lightheadedness with hypotension: No Has patient had a PCN reaction causing severe rash involving mucus membranes or skin necrosis: No Has patient had a PCN reaction that required  hospitalization: No Has patient had a PCN reaction occurring within the last 10 years: Yes If all of the above answers are "NO", then may proceed with Cephalosporin use.  Cathie Olden Rash   FH:  Family History  Problem Relation Age of Onset  . Stroke Mother   . Hypertension Mother   . Diabetes Father   . Hypertension Maternal Grandmother   . Dementia Maternal Grandmother   . Heart disease Maternal Grandmother   . Bone cancer Maternal Grandfather   . Diabetes Paternal Grandmother   . Hypertension Paternal Grandmother   . Heart disease Paternal Grandmother   . Prostate cancer Paternal Grandfather   . Breast cancer Paternal Aunt   . Colon cancer Neg Hx   . Colon polyps Neg Hx   . Esophageal cancer Neg Hx    Soc:  Social History   Social History  . Marital status: Legally Separated    Spouse name: N/A  . Number of children: 0  . Years of education: N/A   Occupational History  . [employer]    Social History Main Topics  . Smoking status: Current Some Day Smoker    Types: Cigarettes    Last attempt to quit: 03/21/2006  . Smokeless tobacco: Never Used  . Alcohol use 0.0 oz/week     Comment: occasionally  . Drug use: No  . Sexual activity: Not Currently    Partners: Male    Birth control/ protection: None   Other Topics Concern  . Not on file   Social History Narrative  . No narrative on file    Review of Systems: no vaginal bleeding or cramping/contractions, no LOF, no nausea/vomiting. All other systems reviewed and are negative.   PE: 215 lbs, 108/64, 73  A/P: 1) Type 1 diabetes due to partial pancreatectomy (benign tumor) versus both - Kristina Neal is poorly controlled with most recent HbA1C of 13.1.  She was discharged earlier this month following an episode of DKA.  Kristina Neal has a history of 4 previous early SABs that have never been worked up - suspect that poorly controlled diabetes at least played some role in her previous pregnancy losses.  We had a brief  discussion regarding risks of poorly controlled diabetes in pregnancy.  In general, a HbA1C > 10 is associated with a 23-24% risk of birth defects - many of which occur even before the patient is able to determine that she is pregnant.  We discussed risks of fetal macrosomia, birth injuries, fetal demise and preterm delivery as well as neonatal risks of RDS and metabolic derangements (hypoglycemia).  We discussed some of the challenges of maintaining tight blood sugar control during pregnancy - the likelihood that she would need frequent admissions for blood sugar control - particularly if she is prone to developing DKA.  I recommended that Kristina Neal achieve tighter glucose control prior to conception.  My goal would be to achieve a HbA1C in the 6-7 range which unfortunately might not be realistic.  Kristina Neal is currently on NPH insulin 70 units daily - will likely need to split dose insulin and meal time coverage.  Kristina Neal requested a second opinion for Endocrinology - a consult request was placed with the [facility] group.   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  Feel that it would be reasonable to recommend a scheduled C-section at 36-37 weeks due to concerns of uterine rupture.   3) Advanced maternal age- Kristina Neal will be > 76 years old at the time of conception.  Her risk for Down syndrome will be at least 1:50.  Would offer genetic counseling after conception.   Thank you for the opportunity to be a part of the care of Kristina Neal. Please contact our office if we can be of further assistance.   I spent approximately 30 minutes with this patient with over 50% of time spent in face-to-face counseling.  Benjaman Lobe, MD Maternal Fetal Medicine

## 2016-10-10 NOTE — Telephone Encounter (Signed)
Dismissal letter sent out by certified / registered mail on October 06, 2016 daj

## 2016-10-12 ENCOUNTER — Ambulatory Visit (INDEPENDENT_AMBULATORY_CARE_PROVIDER_SITE_OTHER): Payer: No Typology Code available for payment source | Admitting: Family Medicine

## 2016-10-12 ENCOUNTER — Encounter: Payer: Self-pay | Admitting: Family Medicine

## 2016-10-12 VITALS — BP 108/70 | HR 80 | Temp 98.1°F | Resp 16 | Ht 70.0 in | Wt 221.0 lb

## 2016-10-12 DIAGNOSIS — E1010 Type 1 diabetes mellitus with ketoacidosis without coma: Secondary | ICD-10-CM | POA: Diagnosis not present

## 2016-10-12 DIAGNOSIS — E108 Type 1 diabetes mellitus with unspecified complications: Secondary | ICD-10-CM

## 2016-10-12 DIAGNOSIS — IMO0002 Reserved for concepts with insufficient information to code with codable children: Secondary | ICD-10-CM

## 2016-10-12 DIAGNOSIS — E1065 Type 1 diabetes mellitus with hyperglycemia: Secondary | ICD-10-CM | POA: Diagnosis not present

## 2016-10-12 NOTE — Assessment & Plan Note (Signed)
Resolved.  Pt admits to insulin noncompliance.  At high risk for recurrence

## 2016-10-12 NOTE — Progress Notes (Signed)
   Subjective:    Patient ID: Kristina Neal, female    DOB: 01-Jun-1975, 41 y.o.   MRN: [id number]  HPI DKA- pt was admitted 7/11-12 w/ DKA due to noncompliance w/ insulin.  Pt brought in ADA accomodation paperwork.  Pt is going to see a new Endocrinologist b/c her MFM was concerned that current Endo did not change insulin dosing.  Currently taking NPH 70 units once daily.  Pt reports increased nausea, fatigue, HAs after insulin dosing.  Pt is having symptomatic lows overnight.  Did not take insulin last night.   Review of Systems For ROS see HPI     Objective:   Physical Exam  Constitutional: She is oriented to person, place, and time. She appears well-developed and well-nourished. No distress.  HENT:  Head: Normocephalic and atraumatic.  Eyes: Pupils are equal, round, and reactive to light. Conjunctivae and EOM are normal.  Neck: Normal range of motion. Neck supple. No thyromegaly present.  Cardiovascular: Normal rate, regular rhythm, normal heart sounds and intact distal pulses.   No murmur heard. Pulmonary/Chest: Effort normal and breath sounds normal. No respiratory distress.  Abdominal: Soft. She exhibits no distension. There is no tenderness.  Musculoskeletal: She exhibits no edema.  Lymphadenopathy:    She has no cervical adenopathy.  Neurological: She is alert and oriented to person, place, and time.  Skin: Skin is warm and dry.  Psychiatric: She has a normal mood and affect. Her behavior is normal.  Vitals reviewed.         Assessment & Plan:

## 2016-10-12 NOTE — Assessment & Plan Note (Addendum)
Deteriorated.  Pt recently admitted w/ DKA due to insulin noncompliance.  Pt is on a once daily NPH regimen which even if compliant, is likely not sufficient.  She is difficult to treat due to labile sugars and frequent symptomatic lows.  She admits she did not take her insulin last night b/c it caused her to feel terrible overnight and when she wakes in the AM.  She feels so poorly that she doesn't eat in the AM which makes switching to twice daily 70/30 insulin difficult.  First will switch NPH to lunch and see if her sxs improve- if yes, will switch to 70/30 BID while waiting on new Endo appt.  Discussed need for med compliance and attention to sugars.  Total time spent w/ pt 30 minutes, >50% spent counseling.  Pt expressed understanding and is in agreement w/ plan.

## 2016-10-12 NOTE — Patient Instructions (Signed)
Follow up as scheduled/needed Move the NPH to lunch time to avoid dropping low overnight If you start feeling better in the AM and can eat breakfast, let me know and we'll switch to 70/30 combo insulin twice daily for better control until you see Endo If you don't hear from the new Endo referral in the next week or so, let me know and we'll try and make that happen Call with any questions or concerns Hang in there!!!

## 2016-10-12 NOTE — Progress Notes (Signed)
Pre visit review using our clinic review tool, if applicable. No additional management support is needed unless otherwise documented below in the visit note. 

## 2016-10-17 ENCOUNTER — Telehealth: Payer: Self-pay | Admitting: Family Medicine

## 2016-10-17 NOTE — Telephone Encounter (Signed)
Received completed FMLA/disability form from Dr. Birdie Riddle.  Form was faxed back to [place], [phone].  Confirmation received.

## 2016-10-18 ENCOUNTER — Other Ambulatory Visit (HOSPITAL_COMMUNITY): Payer: Self-pay

## 2016-11-23 NOTE — Telephone Encounter (Signed)
Certified dismissal letter returned as undeliverable, unclaimed, return to sender after three attempts by [employer]PS on Issis 5, 2018. Letter placed in another envelope and resent as 1st class mail which does not require a signature. daj

## 2017-01-05 ENCOUNTER — Ambulatory Visit: Payer: No Typology Code available for payment source | Admitting: Endocrinology

## 2017-01-28 ENCOUNTER — Other Ambulatory Visit: Payer: Self-pay | Admitting: Family Medicine

## 2017-02-03 ENCOUNTER — Ambulatory Visit: Payer: No Typology Code available for payment source | Admitting: Family Medicine

## 2017-04-24 ENCOUNTER — Ambulatory Visit: Payer: 59 | Admitting: Family Medicine

## 2017-04-24 ENCOUNTER — Encounter: Payer: Self-pay | Admitting: Family Medicine

## 2017-04-24 ENCOUNTER — Other Ambulatory Visit: Payer: Self-pay

## 2017-04-24 VITALS — BP 122/84 | HR 73 | Temp 99.0°F | Resp 16 | Ht 70.0 in | Wt 231.1 lb

## 2017-04-24 DIAGNOSIS — A084 Viral intestinal infection, unspecified: Secondary | ICD-10-CM | POA: Diagnosis not present

## 2017-04-24 DIAGNOSIS — L240 Irritant contact dermatitis due to detergents: Secondary | ICD-10-CM

## 2017-04-24 DIAGNOSIS — L259 Unspecified contact dermatitis, unspecified cause: Secondary | ICD-10-CM | POA: Insufficient documentation

## 2017-04-24 DIAGNOSIS — E1065 Type 1 diabetes mellitus with hyperglycemia: Secondary | ICD-10-CM

## 2017-04-24 LAB — BASIC METABOLIC PANEL
BUN: 8 mg/dL (ref 6–23)
CO2: 28 meq/L (ref 19–32)
Calcium: 9 mg/dL (ref 8.4–10.5)
Chloride: 101 mEq/L (ref 96–112)
Creatinine, Ser: 0.64 mg/dL (ref 0.40–1.20)
GFR: 131.34 mL/min (ref >60.00)
GLUCOSE: 94 mg/dL (ref 70–99)
POTASSIUM: 4.6 meq/L (ref 3.5–5.1)
SODIUM: 138 meq/L (ref 135–145)

## 2017-04-24 LAB — CBC WITH DIFFERENTIAL/PLATELET
BASOS ABS: 0 10*3/uL (ref 0.0–0.1)
BASOS PCT: 0.4 % (ref 0.0–3.0)
Eosinophils Absolute: 0.2 10*3/uL (ref 0.0–0.7)
Eosinophils Relative: 2.3 % (ref 0.0–5.0)
HCT: 37.2 % (ref 36.0–46.0)
Hemoglobin: 12.1 g/dL (ref 12.0–15.0)
Lymphocytes Relative: 24.2 % (ref 12.0–46.0)
Lymphs Abs: 2 10*3/uL (ref 0.7–4.0)
MCHC: 32.5 g/dL (ref 30.0–36.0)
MCV: 85.9 fl (ref 78.0–100.0)
MONOS PCT: 6.2 % (ref 3.0–12.0)
Monocytes Absolute: 0.5 10*3/uL (ref 0.1–1.0)
NEUTROS ABS: 5.5 10*3/uL (ref 1.4–7.7)
NEUTROS PCT: 66.9 % (ref 43.0–77.0)
PLATELETS: 220 10*3/uL (ref 150.0–400.0)
RBC: 4.33 Mil/uL (ref 3.87–5.11)
RDW: 15.7 % — AB (ref 11.5–15.5)
WBC: 8.2 10*3/uL (ref 4.0–10.5)

## 2017-04-24 LAB — LIPID PANEL
CHOLESTEROL: 167 mg/dL (ref 0–200)
HDL: 54.5 mg/dL (ref >39.00)
LDL CALC: 88 mg/dL (ref 0–99)
NonHDL: 112.18
Total CHOL/HDL Ratio: 3
Triglycerides: 120 mg/dL (ref 0.0–149.0)
VLDL: 24 mg/dL (ref 0.0–40.0)

## 2017-04-24 LAB — HEPATIC FUNCTION PANEL
ALK PHOS: 53 U/L (ref 39–117)
ALT: 20 U/L (ref 0–35)
AST: 23 U/L (ref 0–37)
Albumin: 4.1 g/dL (ref 3.5–5.2)
BILIRUBIN DIRECT: 0.1 mg/dL (ref 0.0–0.3)
TOTAL PROTEIN: 7.4 g/dL (ref 6.0–8.3)
Total Bilirubin: 0.4 mg/dL (ref 0.2–1.2)

## 2017-04-24 LAB — HEMOGLOBIN A1C: HEMOGLOBIN A1C: 7.5 % — AB (ref 4.6–6.5)

## 2017-04-24 LAB — TSH: TSH: 2.21 u[IU]/mL (ref 0.35–4.50)

## 2017-04-24 MED ORDER — TRIAMCINOLONE ACETONIDE 0.1 % EX OINT: 1.0000 "application " | TOPICAL_OINTMENT | Freq: Two times a day (BID) | CUTANEOUS | 1 refills | Status: AC

## 2017-04-24 NOTE — Assessment & Plan Note (Signed)
Chronic problem.  Pt has hx of noncompliance w/ follow up and meds.  Overdue for eye exam- pt encouraged to schedule.  UTD on foot exam and microalbumin.  Refer to Endo, check labs.

## 2017-04-24 NOTE — Assessment & Plan Note (Signed)
New.  On dorsum of hands bilaterally.  Reviewed need for irritant avoidance.  Start Triamcinolone BID.  Reviewed supportive care and red flags that should prompt return.  Pt expressed understanding and is in agreement w/ plan.

## 2017-04-24 NOTE — Patient Instructions (Signed)
Schedule your complete physical in 3-4 months We'll notify you of your lab results and make any changes if needed Please schedule your eye exam and have them send me the report We'll call you with your Endocrinology appt Use the Triamcinolone twice daily to improve the irritation of your hands Call with any questions or concerns Hang in there!!!

## 2017-04-24 NOTE — Progress Notes (Signed)
   Subjective:    Patient ID: Kristina Neal, female    DOB: 12/02/1975, 42 y.o.   MRN: [id number]  HPI DM- pt was admitted in July w/ DKA.  Has not seen anyone for her diabetes since.  Last A1C 13.1  Was dismissed from [facility] practice.  Asking for another Endocrinologist.  Reports she has been consistently taking her insulin but she has self-adjusted her dose between 40-70 units depending on intake b/c 70 units daily causes symptomatic lows.  Overdue for eye exam.  Denies CP, SOB, HAs, edema, numbness/tingling of hands/feet.  Contact dermatitis- on hands bilaterally.  Started after she began her job at [employer].  Pt believes that it is a cleaning product that is causing her hands to breakout.    GI illness- pt had illness last week and missed work on Wednesday and Thursday.  Reports feeling better but continues to have loose stools.  Needs note for work.   Review of Systems For ROS see HPI     Objective:   Physical Exam  Constitutional: She is oriented to person, place, and time. She appears well-developed and well-nourished. No distress.  HENT:  Head: Normocephalic and atraumatic.  Eyes: Conjunctivae and EOM are normal. Pupils are equal, round, and reactive to light.  Neck: Normal range of motion. Neck supple. No thyromegaly present.  Cardiovascular: Normal rate, regular rhythm, normal heart sounds and intact distal pulses.  No murmur heard. Pulmonary/Chest: Effort normal and breath sounds normal. No respiratory distress.  Abdominal: Soft. She exhibits no distension. There is no tenderness.  Musculoskeletal: She exhibits no edema.  Lymphadenopathy:    She has no cervical adenopathy.  Neurological: She is alert and oriented to person, place, and time.  Skin: Skin is warm and dry.  Hyperpigmentation of dorsal hands bilaterally  Psychiatric: She has a normal mood and affect. Her behavior is normal.          Assessment & Plan:  Viral gastroenteritis-  resolving.  Note provided for work for time missed.

## 2017-04-25 ENCOUNTER — Telehealth: Payer: Self-pay | Admitting: Family Medicine

## 2017-04-25 ENCOUNTER — Encounter: Payer: Self-pay | Admitting: General Practice

## 2017-04-25 NOTE — Telephone Encounter (Signed)
Pt calling again regarding return to work letter.

## 2017-04-25 NOTE — Telephone Encounter (Signed)
Copied from [place] [id number]. Topic: General - Other >> Apr 25, 2017  3:07 PM Darl Householder, RMA wrote: Reason for CRM: Patient is requesting a letter be faxed to her job stating she can return to work without any restrictions it must say without restrictions, please fax to [phone] ATTN: Jiles Harold

## 2017-04-25 NOTE — Telephone Encounter (Signed)
Pt calling again about work letter - she can not start her shift without it saying that she has no restrictions.

## 2017-04-26 ENCOUNTER — Encounter: Payer: Self-pay | Admitting: General Practice

## 2017-04-26 ENCOUNTER — Other Ambulatory Visit: Payer: Self-pay | Admitting: General Practice

## 2017-04-26 DIAGNOSIS — E1065 Type 1 diabetes mellitus with hyperglycemia: Secondary | ICD-10-CM

## 2017-04-26 NOTE — Telephone Encounter (Signed)
[place] for new letter

## 2017-04-26 NOTE — Telephone Encounter (Signed)
Letter printed and will be faxed today.

## 2017-04-26 NOTE — Telephone Encounter (Signed)
Ok for new letter stating she has no restrictions at this time

## 2017-05-10 ENCOUNTER — Other Ambulatory Visit: Payer: Self-pay | Admitting: Family Medicine

## 2017-07-13 ENCOUNTER — Other Ambulatory Visit: Payer: Self-pay | Admitting: General Practice

## 2017-07-13 NOTE — Telephone Encounter (Signed)
Patient will need to schedule an ER follow-up with Dr. Birdie Riddle to discuss medications.

## 2017-07-13 NOTE — Telephone Encounter (Signed)
Called and advised pt of recommendations. She refused to make an appointment at this time because she lost her insurance. Stated that if something changed she would give [place] a call back.

## 2017-07-13 NOTE — Telephone Encounter (Signed)
Called and spoke with pt. She advised that she was seen at [facility] on 06/15/17 for chest tightness (encounter in chart). Diagnosed as having a panic attack. Pt was given vistaril 25mg  to take TID PRN.   Pt advised that she has been taking on capsule by mouth every day. Pt is due for a refill on 07/16/17 and would like to know if she could continue the vistaril 25mg  daily. Please advise?   Pt uses [facility] [address].    Copied from [place]. Topic: Quick Communication - See Telephone Encounter >> Jul 12, 2017  3:57 PM Antonieta Iba C wrote: CRM for notification. See Telephone encounter for: 07/12/17.  Pt says that she was seen at [facility] ED and prescribed Vistaril 25 MG for anxiety. Pt says that she would like to have a refill on medication (not currently in chart) pt would like to speak with CMA directly first.   Please call pt back.   CB: [phone]

## 2017-08-21 ENCOUNTER — Other Ambulatory Visit: Payer: Self-pay | Admitting: Family Medicine

## 2017-08-25 ENCOUNTER — Encounter: Payer: 59 | Admitting: Family Medicine

## 2018-01-17 ENCOUNTER — Other Ambulatory Visit: Payer: Self-pay | Admitting: Family Medicine
